# Patient Record
Sex: Female | Born: 1985 | Race: White | Hispanic: No | Marital: Single | Smoking: Current every day smoker
Health system: Southern US, Community
[De-identification: ages and names within clinical notes are randomized; demographics above are authoritative.]

## PROBLEM LIST (undated history)

## (undated) ENCOUNTER — Inpatient Hospital Stay (HOSPITAL_COMMUNITY): Payer: Self-pay

## (undated) DIAGNOSIS — N200 Calculus of kidney: Secondary | ICD-10-CM

## (undated) DIAGNOSIS — K0889 Other specified disorders of teeth and supporting structures: Secondary | ICD-10-CM

## (undated) DIAGNOSIS — B999 Unspecified infectious disease: Secondary | ICD-10-CM

## (undated) DIAGNOSIS — F32A Depression, unspecified: Secondary | ICD-10-CM

## (undated) DIAGNOSIS — R519 Headache, unspecified: Secondary | ICD-10-CM

## (undated) DIAGNOSIS — O219 Vomiting of pregnancy, unspecified: Secondary | ICD-10-CM

## (undated) DIAGNOSIS — R569 Unspecified convulsions: Secondary | ICD-10-CM

## (undated) DIAGNOSIS — F419 Anxiety disorder, unspecified: Secondary | ICD-10-CM

## (undated) DIAGNOSIS — N159 Renal tubulo-interstitial disease, unspecified: Secondary | ICD-10-CM

## (undated) DIAGNOSIS — B977 Papillomavirus as the cause of diseases classified elsewhere: Secondary | ICD-10-CM

## (undated) DIAGNOSIS — R51 Headache: Secondary | ICD-10-CM

## (undated) HISTORY — PX: TUBAL LIGATION: SHX77

## (undated) HISTORY — DX: Calculus of kidney: N20.0

## (undated) HISTORY — PX: BREAST SURGERY: SHX581

## (undated) HISTORY — PX: BREAST ENHANCEMENT SURGERY: SHX7

---
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
## 2005-01-07 ENCOUNTER — Inpatient Hospital Stay (HOSPITAL_COMMUNITY): Admission: EM | Admit: 2005-01-07 | Discharge: 2005-01-09 | Payer: Self-pay | Admitting: Emergency Medicine

## 2005-04-12 ENCOUNTER — Inpatient Hospital Stay (HOSPITAL_COMMUNITY): Admission: AD | Admit: 2005-04-12 | Discharge: 2005-04-12 | Payer: Self-pay | Admitting: Obstetrics and Gynecology

## 2006-02-20 ENCOUNTER — Emergency Department (HOSPITAL_COMMUNITY): Admission: EM | Admit: 2006-02-20 | Discharge: 2006-02-20 | Payer: Self-pay | Admitting: Emergency Medicine

## 2006-11-27 ENCOUNTER — Encounter: Admission: RE | Admit: 2006-11-27 | Discharge: 2007-02-25 | Payer: Self-pay | Admitting: Internal Medicine

## 2007-04-25 ENCOUNTER — Encounter: Admission: RE | Admit: 2007-04-25 | Discharge: 2007-04-25 | Payer: Self-pay | Admitting: Internal Medicine

## 2008-06-27 ENCOUNTER — Emergency Department (HOSPITAL_COMMUNITY): Admission: EM | Admit: 2008-06-27 | Discharge: 2008-06-27 | Payer: Self-pay | Admitting: Emergency Medicine

## 2008-07-04 ENCOUNTER — Emergency Department (HOSPITAL_COMMUNITY): Admission: EM | Admit: 2008-07-04 | Discharge: 2008-07-04 | Payer: Self-pay | Admitting: Emergency Medicine

## 2008-08-17 ENCOUNTER — Encounter: Admission: RE | Admit: 2008-08-17 | Discharge: 2008-08-17 | Payer: Self-pay | Admitting: Obstetrics and Gynecology

## 2009-01-21 ENCOUNTER — Emergency Department (HOSPITAL_COMMUNITY): Admission: EM | Admit: 2009-01-21 | Discharge: 2009-01-21 | Payer: Self-pay | Admitting: Emergency Medicine

## 2010-02-14 ENCOUNTER — Emergency Department (HOSPITAL_COMMUNITY): Admission: EM | Admit: 2010-02-14 | Discharge: 2010-02-14 | Payer: Self-pay | Admitting: Emergency Medicine

## 2010-02-15 ENCOUNTER — Emergency Department (HOSPITAL_COMMUNITY): Admission: EM | Admit: 2010-02-15 | Discharge: 2010-02-15 | Payer: Self-pay | Admitting: Emergency Medicine

## 2010-09-24 ENCOUNTER — Encounter: Payer: Self-pay | Admitting: Obstetrics and Gynecology

## 2010-11-20 LAB — URINALYSIS, ROUTINE W REFLEX MICROSCOPIC
Glucose, UA: NEGATIVE mg/dL
Ketones, ur: 40 mg/dL — AB
Nitrite: POSITIVE — AB
Protein, ur: 30 mg/dL — AB

## 2010-11-20 LAB — COMPREHENSIVE METABOLIC PANEL
ALT: 12 U/L (ref 0–35)
AST: 19 U/L (ref 0–37)
BUN: 5 mg/dL — ABNORMAL LOW (ref 6–23)
CO2: 23 mEq/L (ref 19–32)
Calcium: 9.3 mg/dL (ref 8.4–10.5)
Creatinine, Ser: 0.92 mg/dL (ref 0.4–1.2)
Glucose, Bld: 92 mg/dL (ref 70–99)
Potassium: 3.4 mEq/L — ABNORMAL LOW (ref 3.5–5.1)
Sodium: 142 mEq/L (ref 135–145)

## 2010-11-20 LAB — CBC
HCT: 43.4 % (ref 36.0–46.0)
Hemoglobin: 14.7 g/dL (ref 12.0–15.0)
MCHC: 33.8 g/dL (ref 30.0–36.0)
RBC: 4.73 MIL/uL (ref 3.87–5.11)

## 2010-11-20 LAB — DIFFERENTIAL
Eosinophils Relative: 0 % (ref 0–5)
Lymphocytes Relative: 10 % — ABNORMAL LOW (ref 12–46)
Monocytes Relative: 5 % (ref 3–12)
Neutro Abs: 11.5 10*3/uL — ABNORMAL HIGH (ref 1.7–7.7)

## 2010-11-20 LAB — URINE CULTURE

## 2010-11-20 LAB — URINE MICROSCOPIC-ADD ON

## 2010-12-12 LAB — CBC
HCT: 44.2 % (ref 36.0–46.0)
MCV: 91.2 fL (ref 78.0–100.0)
WBC: 12 10*3/uL — ABNORMAL HIGH (ref 4.0–10.5)

## 2010-12-12 LAB — POCT I-STAT, CHEM 8
Calcium, Ion: 1.16 mmol/L (ref 1.12–1.32)
Creatinine, Ser: 0.8 mg/dL (ref 0.4–1.2)
Glucose, Bld: 99 mg/dL (ref 70–99)
HCT: 46 % (ref 36.0–46.0)
Hemoglobin: 15.6 g/dL — ABNORMAL HIGH (ref 12.0–15.0)
Potassium: 3.7 mEq/L (ref 3.5–5.1)

## 2010-12-12 LAB — DIFFERENTIAL
Basophils Absolute: 0 10*3/uL (ref 0.0–0.1)
Basophils Relative: 0 % (ref 0–1)
Eosinophils Absolute: 0 10*3/uL (ref 0.0–0.7)
Eosinophils Relative: 0 % (ref 0–5)
Lymphs Abs: 1.2 10*3/uL (ref 0.7–4.0)
Monocytes Relative: 6 % (ref 3–12)

## 2010-12-12 LAB — URINALYSIS, ROUTINE W REFLEX MICROSCOPIC
Nitrite: NEGATIVE
Protein, ur: 30 mg/dL — AB
Specific Gravity, Urine: 1.025 (ref 1.005–1.030)
Urobilinogen, UA: 1 mg/dL (ref 0.0–1.0)

## 2010-12-12 LAB — URINE MICROSCOPIC-ADD ON

## 2011-01-19 NOTE — Discharge Summary (Signed)
NAMEARIYANAH, AGUADO NO.:  [id number]   MEDICAL RECORD NO.:  [id number]          PATIENT TYPE:  INP   LOCATION:  0358                         FACILITY:  [name]   PHYSICIAN:  Fleet Contras, M.D.    DATE OF BIRTH:  May 29, 1986   DATE OF ADMISSION:  01/07/2005  DATE OF DISCHARGE:  01/09/2005                                 DISCHARGE SUMMARY   PRESENTATION:  [name] is an 25 year old Caucasian lady who was brought  to the emergency room at [facility] by her mom with a complaint of  abdominal discomfort and vomiting of one day duration.  She has had multiple  episodes of vomiting and could not keep food or drink down.  She had the few  days of frequent urination but denies any dysuria.  She has no diarrhea, no  profound abdominal pain.  She had not had a recent cold or cough or  otherwise urinary tract infection.  She did recall an episode of urinary  tract infection about three months prior to this presentation which was  treated with antibiotics that she could not tolerate much, so most likely  did not complete the course.  Her initial evaluation in the emergency room  revealed a fever of 101.  She was slightly dehydrated and quite  uncomfortable.  Her initial laboratory data shows a white count of 13,000.  Her urinalysis was unremarkable.  A urinary pregnancy test was negative.  Her blood cultures were obtained and this also has been negative.  She was  admitted for intravenous antibiotics and intravenous fluids.   HOSPITAL COURSE:  On admission, the patient was started on intravenous  ciprofloxacin 400 mg q.12h. and initially was kept on clear liquids which  she was able to tolerate.  She also received Zofran IV for nausea and  vomiting with resolution of these symptoms.  Intravenous fluids with D-5  normal saline was given at 125 cc an hour.  Within 24 hours, he vomiting has  subsided.  Fever has subsided and she was feeling much better.  Her diet  was  advanced to a regular diet which she was able to tolerate.  An ultrasound  scan of her kidneys, ureters and bladder was performed to evaluate for any  anatomical abnormality and this was essentially normal.  On Jan 09, 2005,  she was feeling much better, well hydrated, afebrile, vital signs were  stable.  Chest was clear to auscultation.  S1 and S2 were heard.  The  abdomen was soft and nontender.  No masses.  Bowel sounds were present.  She  was therefore considered stable for discharge home.   DISCHARGE DIAGNOSES:  1.  Urinary tract infection.  2.  Vomiting with dehydration.   DISCHARGE MEDICATIONS:  Ciprofloxacin 250 mg one p.o. b.i.d. for five more  days.   CONDITION ON DISCHARGE:  Stable.   DISPOSITION:  For home.   FOLLOW UP:  Follow up will be with Dr. Fleet Contras in one week.  She was  encouraged to drink fluids liberally.  This plan of care was explained to  her and her mother  and all their questions were answered.      EA/MEDQ  D:  01/10/2005  T:  01/10/2005  Job:  [id number]

## 2012-09-28 ENCOUNTER — Emergency Department (HOSPITAL_COMMUNITY)
Admission: EM | Admit: 2012-09-28 | Discharge: 2012-09-28 | Disposition: A | Discharge status: Home / Self Care | Payer: Self-pay | Attending: Emergency Medicine | Admitting: Emergency Medicine

## 2012-09-28 DIAGNOSIS — R05 Cough: Secondary | ICD-10-CM | POA: Insufficient documentation

## 2012-09-28 DIAGNOSIS — J029 Acute pharyngitis, unspecified: Secondary | ICD-10-CM | POA: Insufficient documentation

## 2012-09-28 DIAGNOSIS — Z882 Allergy status to sulfonamides status: Secondary | ICD-10-CM | POA: Insufficient documentation

## 2012-09-28 DIAGNOSIS — Z79899 Other long term (current) drug therapy: Secondary | ICD-10-CM | POA: Insufficient documentation

## 2012-09-28 DIAGNOSIS — J3489 Other specified disorders of nose and nasal sinuses: Secondary | ICD-10-CM | POA: Insufficient documentation

## 2012-09-28 DIAGNOSIS — R509 Fever, unspecified: Secondary | ICD-10-CM | POA: Insufficient documentation

## 2012-09-28 DIAGNOSIS — R059 Cough, unspecified: Secondary | ICD-10-CM | POA: Insufficient documentation

## 2012-09-28 DIAGNOSIS — R000 Tachycardia, unspecified: Secondary | ICD-10-CM | POA: Insufficient documentation

## 2012-09-28 MED ORDER — SODIUM CHLORIDE 0.9 % IV BOLUS (SEPSIS)
1000.0000 mL | INTRAVENOUS | Status: AC
  Administered 2012-09-28: 1000 mL via INTRAVENOUS

## 2012-09-28 MED ORDER — ACETAMINOPHEN 160 MG/5ML PO SOLN: 650.0000 mg | Freq: Once | ORAL | Status: DC

## 2012-09-28 MED ORDER — CEFTRIAXONE SODIUM 250 MG IJ SOLR
250.0000 mg | Freq: Once | INTRAMUSCULAR | Status: AC
  Administered 2012-09-28: 250 mg via INTRAMUSCULAR
  Filled 2012-09-28: qty 250

## 2012-09-28 MED ORDER — HYDROCODONE-ACETAMINOPHEN 7.5-325 MG/15ML PO SOLN: 15.0000 mL | Freq: Three times a day (TID) | ORAL | Status: DC | PRN

## 2012-09-28 MED ORDER — AZITHROMYCIN 250 MG PO TABS
1000.0000 mg | ORAL_TABLET | Freq: Once | ORAL | Status: AC
  Administered 2012-09-28: 1000 mg via ORAL
  Filled 2012-09-28: qty 4

## 2012-09-28 MED ORDER — PENICILLIN G BENZATHINE 1200000 UNIT/2ML IM SUSP: 1.2000 10*6.[IU] | Freq: Once | INTRAMUSCULAR | Status: DC

## 2012-09-28 MED ORDER — AMOXICILLIN 500 MG PO CAPS: 500.0000 mg | ORAL_CAPSULE | Freq: Three times a day (TID) | ORAL | Status: DC

## 2012-09-28 MED ORDER — HYDROCODONE-ACETAMINOPHEN 7.5-325 MG/15ML PO SOLN
10.0000 mL | Freq: Once | ORAL | Status: AC
  Administered 2012-09-28: 10 mL via ORAL
  Filled 2012-09-28: qty 15

## 2012-09-28 NOTE — ED Notes (Signed)
Pt c/o sore throat for the past 2 days. Pt denies any other symptoms. States she has taken OTC throat spray, but not helping now. Pt with no acute distress.

## 2012-09-28 NOTE — ED Provider Notes (Signed)
Medical screening examination/treatment/procedure(s) were performed by non-physician practitioner and as supervising physician I was immediately available for consultation/collaboration.  Linna Thebeau, MD 09/28/12 2354 

## 2012-09-28 NOTE — ED Provider Notes (Signed)
History   This chart was scribed for non-physician practitioner working with Gerhard Munch, MD by Frederik Pear, ED Scribe. This patient was seen in room WTR8/WTR8 and the patient's care was started at 2117.   CSN: [id number]  Arrival date & time 09/28/12  2055   First MD Initiated Contact with Patient 09/28/12 2117      Chief Complaint  Patient presents with  . Sore Throat    (Consider location/radiation/quality/duration/timing/severity/associated sxs/prior treatment) HPI  Roberta Baker is a 27 y.o. female who presents to the Emergency Department with a chief complaint of a constant sore throat that began 2 days ago. She reports that the pain is aggravated by talking and swallowing. She reports that she had a fever 2 days ago, which she believes peaked at 101 at home, and treated it with Tylenol. She states that the symptom has since resolved. She reports that she treated her sore throat with OTC throat sprays with no relief. She denies any other symptoms including coughing and rhinorrhea. She denies any chronic medication conditions that require daily medications. She is allergic to sulfa antibiotics and has a h/o of intolerance to pain medication except Percocet.  No past medical history on file.  No past surgical history on file.  No family history on file.  History  Substance Use Topics  . Smoking status: Not on file  . Smokeless tobacco: Not on file  . Alcohol Use: Not on file    OB History    No data available      Review of Systems A complete 10 system review of systems was obtained and all systems are negative except as noted in the HPI and PMH.   Allergies  Sulfa antibiotics  Home Medications   Current Outpatient Rx  Name  Route  Sig  Dispense  Refill  . AMPHETAMINE-DEXTROAMPHET ER 15 MG PO CP24   Oral   Take 15 mg by mouth every morning.         Marland Kitchen CLONAZEPAM 1 MG PO TABS   Oral   Take 1 mg by mouth daily as needed. For anxiety         .  MEDROXYPROGESTERONE ACETATE 150 MG/ML IM SUSP   Intramuscular   Inject 150 mg into the muscle every 3 (three) months.         Marland Kitchen PHENOL 1.4 % MT LIQD   Mouth/Throat   Use as directed 1 spray in the mouth or throat every 4 (four) hours as needed. For sore throat           BP 102/61  Pulse 122  Temp 99.8 F (37.7 C) (Oral)  Resp 17  SpO2 100%  Physical Exam  Nursing note and vitals reviewed. Constitutional: She is oriented to person, place, and time. She appears well-developed and well-nourished. No distress.  HENT:  Head: Normocephalic and atraumatic.       Inflamed oropharynx with mild tonsillar exudates. No para-tonsillar exudates.  Eyes: EOM are normal. Pupils are equal, round, and reactive to light.  Neck: Normal range of motion. Neck supple. No tracheal deviation present.  Cardiovascular: Tachycardia present.   Pulmonary/Chest: Effort normal. No respiratory distress.  Abdominal: Soft. She exhibits no distension.  Musculoskeletal: Normal range of motion. She exhibits no edema.  Lymphadenopathy:    She has cervical adenopathy.  Neurological: She is alert and oriented to person, place, and time.  Skin: Skin is warm and dry.  Psychiatric: She has a normal mood and affect. Her behavior  is normal.    ED Course  Procedures (including critical care time)  DIAGNOSTIC STUDIES: Oxygen Saturation is 100% on room air, normal by my interpretation.    COORDINATION OF CARE:  21:33- Discussed planned course of treatment with the patient, including Rocephin, Zithromax, Hycet, and IV fluids, who is agreeable at this time.  22:00- Medication Orders- Hydrocodone-acetaminophen (Hycet) 7.5-325 mg/15 ml solution 10 ml- once, sodium chloride 0.9% bolus 1,000 mL.  22:15- Medication Orders- azithromycin (zithromax) tablet 1,000 mg- once, ceftriaxone (rocephin) injection 250 mg- once.  Results for orders placed during the hospital encounter of 09/28/12  RAPID STREP SCREEN       Component Value Range   Streptococcus, Group A Screen (Direct) NEGATIVE  NEGATIVE    Labs Reviewed  RAPID STREP SCREEN     1. Pharyngitis       MDM  26 rolled female with sore throat. Patient meets Centor criteria, however I am also concerned about gonococcal pharyngitis, and will obtain a culture. I'm going to treat the patient with ceftriaxone, azithromycin, and amoxicillin. Will also give the patient Hycet for pain.  The patient was also tachycardic in the ED, so I gave her some fluid, and her tachycardia nearly resolved.  Patient's pain is also improved.  The patient is stable and ready for discharge.  I personally performed the services described in this documentation, which was scribed in my presence. The recorded information has been reviewed and is accurate.         Roxy Horseman, PA-C 09/28/12 2300

## 2012-09-30 LAB — GC/CHLAMYDIA PROBE AMP: GC Probe RNA: NEGATIVE

## 2013-12-08 ENCOUNTER — Encounter (HOSPITAL_COMMUNITY): Payer: Self-pay | Admitting: Emergency Medicine

## 2013-12-08 ENCOUNTER — Emergency Department (HOSPITAL_COMMUNITY)
Admission: EM | Admit: 2013-12-08 | Discharge: 2013-12-08 | Disposition: A | Discharge status: Home / Self Care | Payer: Self-pay | Attending: Emergency Medicine | Admitting: Emergency Medicine

## 2013-12-08 ENCOUNTER — Emergency Department (HOSPITAL_COMMUNITY): Payer: Self-pay

## 2013-12-08 DIAGNOSIS — Y929 Unspecified place or not applicable: Secondary | ICD-10-CM | POA: Insufficient documentation

## 2013-12-08 DIAGNOSIS — S62339A Displaced fracture of neck of unspecified metacarpal bone, initial encounter for closed fracture: Secondary | ICD-10-CM | POA: Insufficient documentation

## 2013-12-08 DIAGNOSIS — S62308A Unspecified fracture of other metacarpal bone, initial encounter for closed fracture: Secondary | ICD-10-CM

## 2013-12-08 DIAGNOSIS — Y9389 Activity, other specified: Secondary | ICD-10-CM | POA: Insufficient documentation

## 2013-12-08 DIAGNOSIS — W2209XA Striking against other stationary object, initial encounter: Secondary | ICD-10-CM | POA: Insufficient documentation

## 2013-12-08 DIAGNOSIS — Z79899 Other long term (current) drug therapy: Secondary | ICD-10-CM | POA: Insufficient documentation

## 2013-12-08 MED ORDER — PROMETHAZINE HCL 25 MG PO TABS: 25.0000 mg | ORAL_TABLET | Freq: Four times a day (QID) | ORAL | Status: DC | PRN

## 2013-12-08 MED ORDER — ONDANSETRON 8 MG PO TBDP
8.0000 mg | ORAL_TABLET | Freq: Once | ORAL | Status: AC
  Administered 2013-12-08: 8 mg via ORAL
  Filled 2013-12-08: qty 1

## 2013-12-08 MED ORDER — HYDROCODONE-ACETAMINOPHEN 5-325 MG PO TABS: 1.0000 | ORAL_TABLET | ORAL | Status: DC | PRN

## 2013-12-08 MED ORDER — HYDROCODONE-ACETAMINOPHEN 5-325 MG PO TABS
1.0000 | ORAL_TABLET | Freq: Once | ORAL | Status: AC
  Administered 2013-12-08: 1 via ORAL
  Filled 2013-12-08: qty 1

## 2013-12-08 NOTE — ED Provider Notes (Signed)
CSN: [id number]     Arrival date & time 12/08/13  1840 History  This chart was scribed for Ruby Colaatherine Dwaine Pringle, PA, working with Donnetta HutchingBrian Cook, MD, by Ardelia Memsylan Malpass ED Scribe. This patient was seen in room WTR5/WTR5 and the patient's care was started at 8:14 PM.   Chief Complaint  Patient presents with  . Hand Pain    The history is provided by the patient. No language interpreter was used.    HPI Comments: Roberta Baker is a 28 y.o. female who presents to the Emergency Department complaining of constant, severe right hand pain onset after punching a door about 2 hours ago. Pt states that her pain is worsened with moving her wrist or her fingers. She states that she has applied ice without relief. She states that she has not take any medications for her pain. She denies any other pain or symptoms.   History reviewed. No pertinent past medical history. Past Surgical History  Procedure Laterality Date  . Breast enhancement surgery     No family history on file. History  Substance Use Topics  . Smoking status: Never Smoker   . Smokeless tobacco: Not on file  . Alcohol Use: Yes   OB History   Grav Para Term Preterm Abortions TAB SAB Ect Mult Living                 Review of Systems  All other systems reviewed and are negative.   Allergies  Sulfa antibiotics  Home Medications   Current Outpatient Rx  Name  Route  Sig  Dispense  Refill  . amphetamine-dextroamphetamine (ADDERALL XR) 15 MG 24 hr capsule   Oral   Take 15 mg by mouth every morning.         . clonazePAM (KLONOPIN) 1 MG tablet   Oral   Take 1 mg by mouth daily as needed. For anxiety         . medroxyPROGESTERone (DEPO-PROVERA) 150 MG/ML injection   Intramuscular   Inject 150 mg into the muscle every 3 (three) months.          Triage Vitals: BP 102/70  Pulse 95  Temp(Src) 98.3 F (36.8 C) (Oral)  Resp 18  SpO2 100%  LMP 11/24/2013  Physical Exam  Nursing note and vitals  reviewed. Constitutional: She is oriented to person, place, and time. She appears well-developed and well-nourished. No distress.  HENT:  Head: Normocephalic and atraumatic.  Eyes:  Normal appearance  Neck: Normal range of motion.  Pulmonary/Chest: Effort normal.  Musculoskeletal: Normal range of motion.  Edema and ecchymosis dorsomedial aspect right hand.  Tenderness 3rd-5th metacarpals.  Pain w/ passive flexion wrist as well as 5th digit.  2+ radial pulse, brisk cap refill and distal sensation intact.      Neurological: She is alert and oriented to person, place, and time.  Psychiatric: She has a normal mood and affect. Her behavior is normal.    ED Course  Procedures (including critical care time)  DIAGNOSTIC STUDIES: Oxygen Saturation is 100% on RA, normal by my interpretation.    COORDINATION OF CARE: 8:18 PM- Will obtain an X-ray of pt's right hand. Will also order Norco for pain. Pt advised of plan for treatment and pt agrees.  Medications  HYDROcodone-acetaminophen (NORCO/VICODIN) 5-325 MG per tablet 1 tablet (not administered)   Labs Review Labs Reviewed - No data to display Imaging Review Dg Hand Complete Right  12/08/2013   CLINICAL DATA:  Hand pain after hitting a  wall today.  EXAM: RIGHT HAND - COMPLETE 3+ VIEW  COMPARISON:  None.  FINDINGS: There is an angulated minimally displaced fracture of the head of the fifth metacarpal. The other bones are normal.  IMPRESSION: Boxer's fracture of the distal fifth metacarpal.   Electronically Signed   By: Geanie Cooley M.D.   On: 12/08/2013 20:29     EKG Interpretation None      MDM   Final diagnoses:  Fracture of fifth metacarpal bone    27yo F presents w/ R hand pain s/p punching a wall this evening.  Xray sig for boxer's fx.  No NV deficits on exam.  Ortho tech placed in ulna gutter splint. I prescribed short course of vicodin, recommended ice/elevation and referred to Hand.  Return precautions discussed.   I  personally performed the services described in this documentation, which was scribed in my presence. The recorded information has been reviewed and is accurate.  Otilio Miu, PA-C 12/09/13 [phone]

## 2013-12-08 NOTE — Discharge Instructions (Signed)
Take vicodin as prescribed for severe pain.  Do not drive within four hours of taking this medication (may cause drowsiness or confusion).   Take promethazine as needed for nausea.    Ice 2-3 times a day for 15-20 minutes but do not allow your splint to get wet.  Elevate as often as possible.  Follow up with the Hand specialist you have been referred to.   You may return to the ER if symptoms worsen or you have any other concerns.

## 2013-12-08 NOTE — ED Notes (Signed)
Ortho completed at this time

## 2013-12-08 NOTE — ED Notes (Signed)
Pt presents with NAD. Pt upset and hit a door injuring rt hand and wrist. Pt states pain with movement

## 2013-12-11 NOTE — ED Provider Notes (Signed)
Medical screening examination/treatment/procedure(s) were performed by non-physician practitioner and as supervising physician I was immediately available for consultation/collaboration.   EKG Interpretation None       Donnetta HutchingBrian Bena Kobel, MD 12/11/13 2005

## 2013-12-17 ENCOUNTER — Emergency Department (HOSPITAL_COMMUNITY)
Admission: EM | Admit: 2013-12-17 | Discharge: 2013-12-17 | Disposition: A | Discharge status: Home / Self Care | Payer: Self-pay | Attending: Emergency Medicine | Admitting: Emergency Medicine

## 2013-12-17 ENCOUNTER — Encounter (HOSPITAL_COMMUNITY): Payer: Self-pay | Admitting: Emergency Medicine

## 2013-12-17 DIAGNOSIS — R609 Edema, unspecified: Secondary | ICD-10-CM | POA: Insufficient documentation

## 2013-12-17 DIAGNOSIS — Z79899 Other long term (current) drug therapy: Secondary | ICD-10-CM | POA: Insufficient documentation

## 2013-12-17 DIAGNOSIS — IMO0001 Reserved for inherently not codable concepts without codable children: Secondary | ICD-10-CM | POA: Insufficient documentation

## 2013-12-17 DIAGNOSIS — Z4789 Encounter for other orthopedic aftercare: Secondary | ICD-10-CM

## 2013-12-17 DIAGNOSIS — R209 Unspecified disturbances of skin sensation: Secondary | ICD-10-CM | POA: Insufficient documentation

## 2013-12-17 DIAGNOSIS — S62339A Displaced fracture of neck of unspecified metacarpal bone, initial encounter for closed fracture: Secondary | ICD-10-CM

## 2013-12-17 MED ORDER — ONDANSETRON 4 MG PO TBDP
4.0000 mg | ORAL_TABLET | Freq: Once | ORAL | Status: AC
  Administered 2013-12-17: 4 mg via ORAL
  Filled 2013-12-17: qty 1

## 2013-12-17 MED ORDER — ONDANSETRON HCL 4 MG PO TABS: 4.0000 mg | ORAL_TABLET | Freq: Three times a day (TID) | ORAL | Status: DC | PRN

## 2013-12-17 NOTE — ED Notes (Addendum)
Pt reports being seen on 12/08/13 for a boxers fracture of the right hand. Pt states she was instructed to return if she had difficulty with the dressing. Pt reports numbness and swelling of the index and middle finger. Pt presents from her PCP and was instructed to come the ED. Capillary refill in the affected fingers is less than two seconds. Pt is A/O x4, in NAD, and vitals are WDL. Pt reports having an ortho appointment for the 21st of this month.

## 2013-12-17 NOTE — Discharge Instructions (Signed)
Read the information below.  You may return to the Emergency Department at any time for worsening condition or any new symptoms that concern you.  If you develop uncontrolled pain, weakness or numbness of the extremity, severe discoloration of the skin, or you are unable to move your fingers, return to the ER for a recheck.      Cast or Splint Care Casts and splints support injured limbs and keep bones from moving while they heal. It is important to care for your cast or splint at home.  HOME CARE INSTRUCTIONS  Keep the cast or splint uncovered during the drying period. It can take 24 to 48 hours to dry if it is made of plaster. A fiberglass cast will dry in less than 1 hour.  Do not rest the cast on anything harder than a pillow for the first 24 hours.  Do not put weight on your injured limb or apply pressure to the cast until your health care provider gives you permission.  Keep the cast or splint dry. Wet casts or splints can lose their shape and may not support the limb as well. A wet cast that has lost its shape can also create harmful pressure on your skin when it dries. Also, wet skin can become infected.  Cover the cast or splint with a plastic bag when bathing or when out in the rain or snow. If the cast is on the trunk of the body, take sponge baths until the cast is removed.  If your cast does become wet, dry it with a towel or a blow dryer on the cool setting only.  Keep your cast or splint clean. Soiled casts may be wiped with a moistened cloth.  Do not place any hard or soft foreign objects under your cast or splint, such as cotton, toilet paper, lotion, or powder.  Do not try to scratch the skin under the cast with any object. The object could get stuck inside the cast. Also, scratching could lead to an infection. If itching is a problem, use a blow dryer on a cool setting to relieve discomfort.  Do not trim or cut your cast or remove padding from inside of it.  Exercise  all joints next to the injury that are not immobilized by the cast or splint. For example, if you have a long leg cast, exercise the hip joint and toes. If you have an arm cast or splint, exercise the shoulder, elbow, thumb, and fingers.  Elevate your injured arm or leg on 1 or 2 pillows for the first 1 to 3 days to decrease swelling and pain.It is best if you can comfortably elevate your cast so it is higher than your heart. SEEK MEDICAL CARE IF:   Your cast or splint cracks.  Your cast or splint is too tight or too loose.  You have unbearable itching inside the cast.  Your cast becomes wet or develops a soft spot or area.  You have a bad smell coming from inside your cast.  You get an object stuck under your cast.  Your skin around the cast becomes red or raw.  You have new pain or worsening pain after the cast has been applied. SEEK IMMEDIATE MEDICAL CARE IF:   You have fluid leaking through the cast.  You are unable to move your fingers or toes.  You have discolored (blue or white), cool, painful, or very swollen fingers or toes beyond the cast.  You have tingling or numbness  around the injured area.  You have severe pain or pressure under the cast.  You have any difficulty with your breathing or have shortness of breath.  You have chest pain. Document Released: 08/17/2000 Document Revised: 06/10/2013 Document Reviewed: 02/26/2013 [facility] Patient Information 2014 [place], [place].

## 2013-12-17 NOTE — ED Provider Notes (Signed)
CSN: [id number]     Arrival date & time 12/17/13  1502 History  [id number] chart was scribed for non-physician practitioner working with Enid SkeensJoshua M Zavitz, MD by Ashley JacobsBrittany Andrews, ED scribe. This patient was seen in room WTR5/WTR5 and the patient's care was started at 3:27 PM.   First MD Initiated Contact with Patient 12/17/13 1509     Chief Complaint  Patient presents with  . Wound Check     (Consider location/radiation/quality/duration/timing/severity/associated sxs/prior Treatment) The history is provided by the patient and medical records. No language interpreter was used.   HPI Comments: Roberta Baker is a 28 y.o. female who presents to the Emergency Department for a wound check. Pt was seen 12/08/13 to be treated for a boxer fracture to her right hand. Pt was advised to come to the ED under the advice of her PCP. Pt has swelling, pain, and numbness to her second and third digit. Her symptoms started the second day after her visit to the ED and became unbearable four day PTA. She has an appointment with an orthopedist in six days.  History reviewed. No pertinent past medical history. Past Surgical History  Procedure Laterality Date  . Breast enhancement surgery     No family history on file. History  Substance Use Topics  . Smoking status: Never Smoker   . Smokeless tobacco: Not on file  . Alcohol Use: Yes   OB History   Grav Para Term Preterm Abortions TAB SAB Ect Mult Living                 Review of Systems  Musculoskeletal: Positive for arthralgias and myalgias.  Neurological: Positive for numbness.  All other systems reviewed and are negative.     Allergies  Sulfa antibiotics  Home Medications   Prior to Admission medications   Medication Sig Start Date End Date Taking? Authorizing Provider  amphetamine-dextroamphetamine (ADDERALL XR) 15 MG 24 hr capsule Take 15 mg by mouth every morning.    Historical Provider, MD  clonazePAM (KLONOPIN) 1 MG tablet Take 1 mg  by mouth daily as needed. For anxiety    Historical Provider, MD  HYDROcodone-acetaminophen (NORCO/VICODIN) 5-325 MG per tablet Take 1 tablet by mouth every 4 (four) hours as needed for moderate pain. 12/08/13   Arie Sabinaatherine E Schinlever, PA-C  medroxyPROGESTERone (DEPO-PROVERA) 150 MG/ML injection Inject 150 mg into the muscle every 3 (three) months.    Historical Provider, MD  promethazine (PHENERGAN) 25 MG tablet Take 1 tablet (25 mg total) by mouth every 6 (six) hours as needed for nausea or vomiting. 12/08/13   Arie Sabinaatherine E Schinlever, PA-C   BP 112/71  Pulse 98  Temp(Src) 98.4 F (36.9 C) (Oral)  Resp 16  SpO2 100%  LMP 11/24/2013 Physical Exam  Nursing note and vitals reviewed. Constitutional: She appears well-developed and well-nourished. No distress.  HENT:  Head: Normocephalic and atraumatic.  Neck: Neck supple.  Cardiovascular: Normal rate and intact distal pulses.   Pulmonary/Chest: Effort normal.  Musculoskeletal: Normal range of motion. She exhibits edema.  Ulnar gutter splint to right upper extremity  Full active ROM of the first, second, and third digit Cap refill is less than 3 seconds Third finger is mildly edematous Sensation intact fourth and fifth digit  After splint removed:  Dry, pink area at base of 3rd finger that was abutting the splint.  No break in skin.  Mild swelling has resolved.    Neurological: She is alert.  Skin: She is not diaphoretic.  ED Course  Procedures (including critical care time) DIAGNOSTIC STUDIES: Oxygen Saturation is 100% on room air, normal by my interpretation.    COORDINATION OF CARE:  3:21 PM Discussed course of care with pt . Pt understands and agrees.    Labs Review Labs Reviewed - No data to display  Imaging Review No results found.   EKG Interpretation None      Pt vomiting.  Family member states this is what happens when patient gets overwhelmed.  Also having pain while splint is being changed.    MDM   Final  diagnoses:  Closed boxer's fracture  Aftercare for cast or splint check or change   Pt with known boxers fracture presents with problems with her splint.  Splint was causing her middle finger to be pushed dorsally/posterior anatomical positioning with mild swelling.  Neurovascularly intact.  Skin intact.  Splint removed and replaced by ortho tech.  Pt reporting great relief.  She did experience an episode of vomiting while she was here that resolved with zofran- family member noted it to be emotional/anxious response that is common for her.  Pt d/c home with orthopedic follow up as previously planned.  Discussed  findings, treatment, and follow up  with patient.  Pt given return precautions.  Pt verbalizes understanding and agrees with plan.      I personally performed the services described in this documentation, which was scribed in my presence. The recorded information has been reviewed and is accurate.     WalkerEmily Shawna Wearing, PA-C 12/17/13 [phone]

## 2013-12-18 NOTE — ED Provider Notes (Signed)
Medical screening examination/treatment/procedure(s) were performed by non-physician practitioner and as supervising physician I was immediately available for consultation/collaboration.   EKG Interpretation None        Roberta SkeensJoshua M Flora Parks, MD 12/18/13 [phone]

## 2014-07-13 ENCOUNTER — Emergency Department (HOSPITAL_COMMUNITY): Payer: Self-pay

## 2014-07-13 ENCOUNTER — Encounter (HOSPITAL_COMMUNITY): Payer: Self-pay | Admitting: Emergency Medicine

## 2014-07-13 ENCOUNTER — Emergency Department (HOSPITAL_COMMUNITY)
Admission: EM | Admit: 2014-07-13 | Discharge: 2014-07-13 | Disposition: A | Discharge status: Home / Self Care | Payer: Self-pay | Attending: Emergency Medicine | Admitting: Emergency Medicine

## 2014-07-13 DIAGNOSIS — F1320 Sedative, hypnotic or anxiolytic dependence, uncomplicated: Secondary | ICD-10-CM

## 2014-07-13 DIAGNOSIS — Z3202 Encounter for pregnancy test, result negative: Secondary | ICD-10-CM | POA: Insufficient documentation

## 2014-07-13 DIAGNOSIS — F19230 Other psychoactive substance dependence with withdrawal, uncomplicated: Secondary | ICD-10-CM

## 2014-07-13 DIAGNOSIS — G40901 Epilepsy, unspecified, not intractable, with status epilepticus: Secondary | ICD-10-CM | POA: Insufficient documentation

## 2014-07-13 DIAGNOSIS — F19930 Other psychoactive substance use, unspecified with withdrawal, uncomplicated: Secondary | ICD-10-CM

## 2014-07-13 DIAGNOSIS — R569 Unspecified convulsions: Secondary | ICD-10-CM

## 2014-07-13 HISTORY — DX: Unspecified convulsions: R56.9

## 2014-07-13 LAB — CBC WITH DIFFERENTIAL/PLATELET
BASOS ABS: 0 10*3/uL (ref 0.0–0.1)
BASOS PCT: 0 % (ref 0–1)
Eosinophils Absolute: 0 10*3/uL (ref 0.0–0.7)
Eosinophils Relative: 0 % (ref 0–5)
HEMATOCRIT: 42.1 % (ref 36.0–46.0)
Hemoglobin: 14.2 g/dL (ref 12.0–15.0)
Lymphocytes Relative: 14 % (ref 12–46)
Lymphs Abs: 1.6 10*3/uL (ref 0.7–4.0)
MCH: 31.2 pg (ref 26.0–34.0)
MCHC: 33.7 g/dL (ref 30.0–36.0)
MCV: 92.5 fL (ref 78.0–100.0)
MONO ABS: 0.9 10*3/uL (ref 0.1–1.0)
Monocytes Relative: 8 % (ref 3–12)
NEUTROS ABS: 9 10*3/uL — AB (ref 1.7–7.7)
NEUTROS PCT: 78 % — AB (ref 43–77)
PLATELETS: 289 10*3/uL (ref 150–400)
RBC: 4.55 MIL/uL (ref 3.87–5.11)
RDW: 13.1 % (ref 11.5–15.5)
WBC: 11.5 10*3/uL — ABNORMAL HIGH (ref 4.0–10.5)

## 2014-07-13 LAB — COMPREHENSIVE METABOLIC PANEL
ALK PHOS: 52 U/L (ref 39–117)
ALT: 10 U/L (ref 0–35)
ANION GAP: 11 (ref 5–15)
AST: 15 U/L (ref 0–37)
Albumin: 4.2 g/dL (ref 3.5–5.2)
BILIRUBIN TOTAL: 0.4 mg/dL (ref 0.3–1.2)
BUN: 3 mg/dL — AB (ref 6–23)
CHLORIDE: 104 meq/L (ref 96–112)
CO2: 26 meq/L (ref 19–32)
CREATININE: 0.6 mg/dL (ref 0.50–1.10)
Calcium: 9.5 mg/dL (ref 8.4–10.5)
GLUCOSE: 101 mg/dL — AB (ref 70–99)
POTASSIUM: 3.8 meq/L (ref 3.7–5.3)
Sodium: 141 mEq/L (ref 137–147)
Total Protein: 6.7 g/dL (ref 6.0–8.3)

## 2014-07-13 LAB — PREGNANCY, URINE: Preg Test, Ur: NEGATIVE

## 2014-07-13 LAB — URINALYSIS, ROUTINE W REFLEX MICROSCOPIC
BILIRUBIN URINE: NEGATIVE
Glucose, UA: NEGATIVE mg/dL
KETONES UR: NEGATIVE mg/dL
Leukocytes, UA: NEGATIVE
NITRITE: NEGATIVE
Protein, ur: NEGATIVE mg/dL
SPECIFIC GRAVITY, URINE: 1.012 (ref 1.005–1.030)
UROBILINOGEN UA: 0.2 mg/dL (ref 0.0–1.0)
pH: 7 (ref 5.0–8.0)

## 2014-07-13 LAB — CBG MONITORING, ED: Glucose-Capillary: 99 mg/dL (ref 70–99)

## 2014-07-13 LAB — RAPID URINE DRUG SCREEN, HOSP PERFORMED
Amphetamines: NOT DETECTED
BARBITURATES: NOT DETECTED
Benzodiazepines: POSITIVE — AB
Cocaine: NOT DETECTED
OPIATES: NOT DETECTED
TETRAHYDROCANNABINOL: POSITIVE — AB

## 2014-07-13 LAB — URINE MICROSCOPIC-ADD ON

## 2014-07-13 MED ORDER — PROMETHAZINE HCL 25 MG/ML IJ SOLN
25.0000 mg | Freq: Once | INTRAMUSCULAR | Status: AC
  Administered 2014-07-13: 25 mg via INTRAVENOUS
  Filled 2014-07-13: qty 1

## 2014-07-13 MED ORDER — MAGNESIUM SULFATE 2 GM/50ML IV SOLN
2.0000 g | Freq: Once | INTRAVENOUS | Status: AC
  Administered 2014-07-13: 2 g via INTRAVENOUS
  Filled 2014-07-13: qty 50

## 2014-07-13 MED ORDER — CLONAZEPAM 1 MG PO TABS: 1.0000 mg | ORAL_TABLET | Freq: Two times a day (BID) | ORAL | Status: DC | PRN

## 2014-07-13 MED ORDER — METOCLOPRAMIDE HCL 5 MG/ML IJ SOLN
10.0000 mg | Freq: Once | INTRAMUSCULAR | Status: AC
  Administered 2014-07-13: 10 mg via INTRAVENOUS
  Filled 2014-07-13: qty 2

## 2014-07-13 MED ORDER — LORAZEPAM 2 MG/ML IJ SOLN
1.0000 mg | Freq: Once | INTRAMUSCULAR | Status: AC
  Administered 2014-07-13: 1 mg via INTRAVENOUS
  Filled 2014-07-13: qty 1

## 2014-07-13 MED ORDER — DIPHENHYDRAMINE HCL 50 MG/ML IJ SOLN
25.0000 mg | Freq: Once | INTRAMUSCULAR | Status: AC
  Administered 2014-07-13: 25 mg via INTRAVENOUS
  Filled 2014-07-13: qty 1

## 2014-07-13 MED ORDER — KETOROLAC TROMETHAMINE 30 MG/ML IJ SOLN
30.0000 mg | Freq: Once | INTRAMUSCULAR | Status: AC
  Administered 2014-07-13: 30 mg via INTRAVENOUS
  Filled 2014-07-13: qty 1

## 2014-07-13 MED ORDER — AMPHETAMINE-DEXTROAMPHET ER 15 MG PO CP24: 15.0000 mg | ORAL_CAPSULE | ORAL | Status: DC

## 2014-07-13 MED ORDER — SODIUM CHLORIDE 0.9 % IV BOLUS (SEPSIS)
1000.0000 mL | Freq: Once | INTRAVENOUS | Status: AC
  Administered 2014-07-13: 1000 mL via INTRAVENOUS

## 2014-07-13 NOTE — ED Notes (Signed)
CBG- 99 

## 2014-07-13 NOTE — Discharge Instructions (Signed)
You need to get a primary care doctor, you can go to the wellness clinic to get established. You also need someone to manage your ADD and anxiety. You can go to [facility] and have them start managing that.     Benzodiazepine Withdrawal  Benzodiazepines are a group of drugs that are prescribed for both short-term and long-term treatment of a variety of medical conditions. For some of these conditions, such as seizures and sudden and severe muscle spasms, they are used only for a few hours or a few days. For other conditions, such as anxiety, sleep problems, or frequent muscle spasms or to help prevent seizures, they are used for an extended period, usually weeks or months. Benzodiazepines work by changing the way your brain functions. Normally, chemicals in your brain called neurotransmitters send messages between your brain cells. The neurotransmitter that benzodiazepines affect is called gamma-aminobutyric acid (GABA). GABA sends out messages that have a calming effect on many of the functions of your brain. Benzodiazepines make these messages stronger and increase this calming effect. Short-term use of benzodiazepines usually does not cause problems when you stop taking the drugs. However, if you take benzodiazepines for a long time, your body can adjust to the drug and require more of it to produce the same effect (drug tolerance). Eventually, you can develop physical dependence on benzodiazepines, which is when you experience negative effects if your dosage of benzodiazepines is reduced or stopped too quickly. These negative effects are called symptoms of withdrawal. SYMPTOMS Symptoms of withdrawal may begin anytime within the first 10 days after you stop taking the benzodiazepine. They can last from several weeks up to a few months but usually are the worst between the first 10 to 14 days.  The actual symptoms also vary, depending on the type of benzodiazepine you take. Possible symptoms  include:  Anxiety.  Excitability.  Irritability.  Depression.  Mood swings.  Trouble sleeping.  Confusion.  Uncontrollable shaking (tremors).  Muscle weakness.  Seizures. DIAGNOSIS To diagnose benzodiazepine withdrawal, your caregiver will examine you for certain signs, such as:  Rapid heartbeat.  Rapid breathing.  Tremors.  High blood pressure.  Fever.  Mood changes. Your caregiver also may ask the following questions about your use of benzodiazepines:  What type of benzodiazepine did you take?  How much did you take each day?  How long did you take the drug?  When was the last time you took the drug?  Do you take any other drugs?  Have you had alcohol recently?  Have you had a seizure recently?  Have you lost consciousness recently?  Have you had trouble remembering recent events?  Have you had a recent increase in anxiety, irritability, or trouble sleeping? A drug test also may be administered. TREATMENT The treatment for benzodiazepine withdrawal can vary, depending on the type and severity of your symptoms, what type of benzodiazepine you have been taking, and how long you have been taking the benzodiazepine. Sometimes it is necessary for you to be treated in a hospital, especially if you are at risk of seizures.  Often, treatment includes a prescription for a long-acting benzodiazepine, the dosage of which is reduced slowly over a long period. This period could be several weeks or months. Eventually, your dosage will be reduced to a point that you can stop taking the drug, without experiencing withdrawal symptoms. This is called tapered withdrawal. Occasionally, minor symptoms of withdrawal continue for a few days or weeks after you have completed a tapered  withdrawal. SEEK IMMEDIATE MEDICAL CARE IF:  You have a seizure.  You develop a craving for drugs or alcohol.  You begin to experience symptoms of withdrawal during your tapered  withdrawal.  You become very confused.  You lose consciousness.  You have trouble breathing.  You think about hurting yourself or someone else. Document Released: 08/09/2011 Document Revised: 11/12/2011 Document Reviewed: 08/09/2011 [facility] Patient Information 2015 [place], [place]. This information is not intended to replace advice given to you by your health care provider. Make sure you discuss any questions you have with your health care provider.  Chemical Dependency Chemical dependency is an addiction to drugs or alcohol. It is characterized by the repeated behavior of seeking out and using drugs and alcohol despite harmful consequences to the health and safety of ones self and others.  RISK FACTORS There are certain situations or behaviors that increase a person's risk for chemical dependency. These include:  A family history of chemical dependency.  A history of mental health issues, including depression and anxiety.  A home environment where drugs and alcohol are easily available to you.  Drug or alcohol use at a young age. SYMPTOMS  The following symptoms can indicate chemical dependency:  Inability to limit the use of drugs or alcohol.  Nausea, sweating, shakiness, and anxiety that occurs when alcohol or drugs are not being used.  An increase in amount of drugs or alcohol that is necessary to get drunk or high. People who experience these symptoms can assess their use of drugs and alcohol by asking themselves the following questions:  Have you been told by friends or family that they are worried about your use of alcohol or drugs?  Do friends and family ever tell you about things you did while drinking alcohol or using drugs that you do not remember?  Do you lie about using alcohol or drugs or about the amounts you use?  Do you have difficulty completing daily tasks unless you use alcohol or drugs?  Is the level of your work or school performance lower because of  your drug or alcohol use?  Do you get sick from using drugs or alcohol but keep using anyway?  Do you feel uncomfortable in social situations unless you use alcohol or drugs?  Do you use drugs or alcohol to help forget problems? An answer of yes to any of these questions may indicate chemical dependency. Professional evaluation is suggested. Document Released: 08/14/2001 Document Revised: 11/12/2011 Document Reviewed: 10/26/2010 [facility] Patient Information 2015 [place], [place]. This information is not intended to replace advice given to you by your health care provider. Make sure you discuss any questions you have with your health care provider.

## 2014-07-13 NOTE — ED Provider Notes (Signed)
4:30 PM  Assumed care from Dr. Lynelle DoctorKnapp.  Pt is a 28 y.o. F who presents to the emergency department with 2 seizures today.  Possibly secondary to benzodiazepine withdrawal. She has follow-up scheduled with [place] and wellness. She was complaining of headache, head CT is pending. Will follow-up on head CT results and on patient's headache.   5:00 PM  Pt's head CT is unremarkable. She is still complaining of headache. We'll give Toradol, Phenergan. She has received Reglan, IV fluids, Benadryl, Ativan.    5:40 PM  Will dc pt home with mother and significant other. She has follow-up with [facility]. Dr. Lynelle DoctorKnapp Has Given the Patient a Refill for Klonopin and Follow[facility] Information with [facility].  Layla MawKristen N Ward, DO 07/13/14 1744

## 2014-07-13 NOTE — Progress Notes (Signed)
Chart reviewed.  Spoke with pt concerning his medications ans able to afford them.  Explained the MATCH- Medication Assistance program to pt. Pt understand that the [facility] is only a one time in one year from the date of discharge to next year. Pt also understand that there is a $3.00 co pay for each prescription. Pt also given information on [place] [facility] for PCP establishment and assistance obtaining an orange card.

## 2014-07-13 NOTE — ED Provider Notes (Signed)
CSN: [id number]     Arrival date & time 07/13/14  1215 History   First MD Initiated Contact with Patient 07/13/14 1221     Chief Complaint  Patient presents with  . Seizures     (Consider location/radiation/quality/duration/timing/severity/associated sxs/prior Treatment) HPI  Patient presents emergency department with her mother. Patient had a seizure when she was 28 years old when she was started on Depo-Provera. Her mother reports she was having periods that would not stop. She also has been on Klonopin and Adderall since she was 28 years old. She reports she does her primary care physician money and ran out of her Adderall and her Klonopin a month ago and has not been able to get them filled.she also missed her last dose of Depo-Provera which was also do a month ago. She had been doing well. Mother states this morning she was walking in the kitchen to get something to eat and she was looking at the cabinets. Her mother states she started taking small rapid steps forward, she then started taking rapid steps backward and then she fell backwards. Mother states she was "mildly stiff". She had some mild jerking. Mother states that lasted about 5 minutes. Patient states she has a right sided temple pressure that started about 8:30 this morning otherwise no injury from her fall. She denies any flashing lights. She denies nausea, vomiting, blurred vision, numbness or tingling of her extremities. She did start having vaginal bleeding 2 days ago and states her bleeding is heavy and spotting.  FH Mother of patient states she had seizures due to medications, including tramadol.     PCP Dr Nicholos Johns  Past Medical History  Diagnosis Date  . Seizures    Past Surgical History  Procedure Laterality Date  . Breast enhancement surgery     History reviewed. No pertinent family history. History  Substance Use Topics  . Smoking status: Never Smoker   . Smokeless tobacco: Never Used  . 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   OB History    No data available     Review of Systems  All other systems reviewed and are negative.     Allergies  Sulfa antibiotics  Home Medications   Prior to Admission medications   Medication Sig Start Date End Date Taking? Authorizing Provider  diphenhydrAMINE (BENADRYL) 25 mg capsule Take 50 mg by mouth at bedtime as needed for sleep.   Yes Historical Provider, MD  HYDROcodone-acetaminophen (NORCO/VICODIN) 5-325 MG per tablet Take 1 tablet by mouth every 4 (four) hours as needed for moderate pain. Patient not taking: Reported on 07/13/2014 12/08/13   Arie Sabina Schinlever, PA-C  ondansetron (ZOFRAN) 4 MG tablet Take 1 tablet (4 mg total) by mouth every 8 (eight) hours as needed for nausea or vomiting. Patient not taking: Reported on 07/13/2014 12/17/13   Trixie Dredge, PA-C  promethazine (PHENERGAN) 25 MG tablet Take 1 tablet (25 mg total) by mouth every 6 (six) hours as needed for nausea or vomiting. Patient not taking: Reported on 07/13/2014 12/08/13   Arie Sabina Schinlever, PA-C   BP 99/74 mmHg  Pulse 87  Temp(Src) 98.3 F (36.8 C) (Oral)  Resp 11  SpO2 97%  LMP 07/12/2014  Vital signs normal     Physical Exam  Constitutional: She is oriented to person, place, and time. She appears well-developed and well-nourished.  Non-toxic appearance. She does not  appear ill. No distress.  HENT:  Head: Normocephalic and atraumatic.  Right Ear: External ear normal.  Left Ear: External ear normal.  Nose: Nose normal. No mucosal edema or rhinorrhea.  Mouth/Throat: Oropharynx is clear and moist and mucous membranes are normal. No dental abscesses or uvula swelling.  Eyes: Conjunctivae and EOM are normal. Pupils are equal, round, and reactive to light.  Neck: Normal range of motion and full passive range of motion without pain. Neck supple.  Cardiovascular:  Normal rate, regular rhythm and normal heart sounds.  Exam reveals no gallop and no friction rub.   No murmur heard. Pulmonary/Chest: Effort normal and breath sounds normal. No respiratory distress. She has no wheezes. She has no rhonchi. She has no rales. She exhibits no tenderness and no crepitus.  Abdominal: Soft. Normal appearance and bowel sounds are normal. She exhibits no distension. There is no tenderness. There is no rebound and no guarding.  Musculoskeletal: Normal range of motion. She exhibits no edema or tenderness.  Moves all extremities well.   Neurological: She is alert and oriented to person, place, and time. She has normal strength. No cranial nerve deficit.  Skin: Skin is warm, dry and intact. No rash noted. No erythema. No pallor.  Pt has multiple pastel colored tatoos. Pt has manicured nails with add on plastic butterflies and jewels  Psychiatric: She has a normal mood and affect. Her speech is normal and behavior is normal. Her mood appears not anxious.  Nursing note and vitals reviewed.   ED Course  Procedures (including critical care time)  Medications  magnesium sulfate IVPB 2 g 50 mL (not administered)  sodium chloride 0.9 % bolus 1,000 mL (1,000 mLs Intravenous New Bag/Given 07/13/14 1420)  diphenhydrAMINE (BENADRYL) injection 25 mg (25 mg Intravenous Given 07/13/14 1419)  metoCLOPramide (REGLAN) injection 10 mg (10 mg Intravenous Given 07/13/14 1419)  LORazepam (ATIVAN) injection 1 mg (1 mg Intravenous Given 07/13/14 1523)   14:40Nursing staff report pt had a brief episode where she was staring and had some stiffening and minimal twitching, was post-ictal. Pt c/o worsening HA. [place] the [place] shows patient got #60 clonazepam on September 30 which should last 1 month, and #30 Adderall on September 30. This would mean she only ran out of her medications less than 2 weeks ago not the over a month but she told me before. Her mother and her boyfriend  do not know when she ran out of her medicine however her boyfriend thinks maybe 2 weeks ago. Her UDS is positive for benzos so patient may be getting her medication elsewhere. Patient was given Ativan and head CT were ordered.  Pt was seen by the case manager and [facility] on [facility]   16:35 Dr Elesa MassedWard will check on head CT and see if her headache is improved.   Labs Review Results for orders placed or performed during the hospital encounter of 07/13/14  Comprehensive metabolic panel  Result Value Ref Range   Sodium 141 137 - 147 mEq/L   Potassium 3.8 3.7 - 5.3 mEq/L   Chloride 104 96 - 112 mEq/L   CO2 26 19 - 32 mEq/L   Glucose, Bld 101 (H) 70 - 99 mg/dL   BUN 3 (L) 6 - 23 mg/dL   Creatinine, Ser 1.610.60 0.50 - 1.10 mg/dL   Calcium 9.5 8.4 - 09.610.5 mg/dL   Total Protein 6.7 6.0 - 8.3 g/dL   Albumin 4.2 3.5 -  5.2 g/dL   AST 15 0 - 37 U/L   ALT 10 0 - 35 U/L   Alkaline Phosphatase 52 39 - 117 U/L   Total Bilirubin 0.4 0.3 - 1.2 mg/dL   GFR calc non Af Amer >90 >90 mL/min   GFR calc Af Amer >90 >90 mL/min   Anion gap 11 5 - 15  CBC with Differential  Result Value Ref Range   WBC 11.5 (H) 4.0 - 10.5 K/uL   RBC 4.55 3.87 - 5.11 MIL/uL   Hemoglobin 14.2 12.0 - 15.0 g/dL   HCT 40.9 81.1 - 91.4 %   MCV 92.5 78.0 - 100.0 fL   MCH 31.2 26.0 - 34.0 pg   MCHC 33.7 30.0 - 36.0 g/dL   RDW 78.2 95.6 - 21.3 %   Platelets 289 150 - 400 K/uL   Neutrophils Relative % 78 (H) 43 - 77 %   Neutro Abs 9.0 (H) 1.7 - 7.7 K/uL   Lymphocytes Relative 14 12 - 46 %   Lymphs Abs 1.6 0.7 - 4.0 K/uL   Monocytes Relative 8 3 - 12 %   Monocytes Absolute 0.9 0.1 - 1.0 K/uL   Eosinophils Relative 0 0 - 5 %   Eosinophils Absolute 0.0 0.0 - 0.7 K/uL   Basophils Relative 0 0 - 1 %   Basophils Absolute 0.0 0.0 - 0.1 K/uL  Urinalysis, Routine w reflex microscopic  Result Value Ref Range   Color, Urine YELLOW YELLOW   APPearance CLEAR CLEAR   Specific Gravity, Urine 1.012 1.005 -  1.030   pH 7.0 5.0 - 8.0   Glucose, UA NEGATIVE NEGATIVE mg/dL   Hgb urine dipstick TRACE (A) NEGATIVE   Bilirubin Urine NEGATIVE NEGATIVE   Ketones, ur NEGATIVE NEGATIVE mg/dL   Protein, ur NEGATIVE NEGATIVE mg/dL   Urobilinogen, UA 0.2 0.0 - 1.0 mg/dL   Nitrite NEGATIVE NEGATIVE   Leukocytes, UA NEGATIVE NEGATIVE  Pregnancy, urine  Result Value Ref Range   Preg Test, Ur NEGATIVE NEGATIVE  Urine rapid drug screen (hosp performed)  Result Value Ref Range   Opiates NONE DETECTED NONE DETECTED   Cocaine NONE DETECTED NONE DETECTED   Benzodiazepines POSITIVE (A) NONE DETECTED   Amphetamines NONE DETECTED NONE DETECTED   Tetrahydrocannabinol POSITIVE (A) NONE DETECTED   Barbiturates NONE DETECTED NONE DETECTED  Urine microscopic-add on  Result Value Ref Range   Squamous Epithelial / LPF RARE RARE   WBC, UA 0-2 <3 WBC/hpf   RBC / HPF 0-2 <3 RBC/hpf   Bacteria, UA RARE RARE  CBG monitoring, ED  Result Value Ref Range   Glucose-Capillary 99 70 - 99 mg/dL   Laboratory interpretation all normal except + UDS and leukocytosis     Imaging Review No results found.   EKG Interpretation   Date/Time:  Tuesday July 13 2014 12:37:30 EST Ventricular Rate:  87 PR Interval:  126 QRS Duration: 80 QT Interval:  361 QTC Calculation: 434 R Axis:   62 Text Interpretation:  Age not entered, assumed to be  28 years old for  purpose of ECG interpretation Sinus rhythm Since last tracing rate slower  (07 Jan 2005) Confirmed by [facility]  MD-I, Uzoma Vivona ([id number]) on 07/13/2014 1:56:13  PM      MDM   Final diagnoses:  Withdrawal seizures, uncomplicated  Benzodiazepine dependence    Plan discharge unless her Head CT is positive.   Modified Medications   Modified Medication Previous Medication   AMPHETAMINE-DEXTROAMPHETAMINE (ADDERALL XR) 15 MG  24 HR CAPSULE amphetamine-dextroamphetamine (ADDERALL XR) 15 MG 24 hr capsule      Take 1 capsule by mouth every morning.    Take 15 mg by mouth  every morning.   CLONAZEPAM (KLONOPIN) 1 MG TABLET clonazePAM (KLONOPIN) 1 MG tablet      Take 1 tablet (1 mg total) by mouth 2 (two) times daily as needed for anxiety. For anxiety    Take 1 mg by mouth daily as needed. For anxiety    Devoria AlbeIva Esmerelda Finnigan, MD, Armando GangFACEP      Ward GivensIva L Tonja Jezewski, MD 07/13/14 1640

## 2014-07-13 NOTE — ED Notes (Signed)
Placed pt on 2 liters of oxygen (nasal cannula) due to anxiety pt stating that is feels like her heart is racing and she cant breath RN notified

## 2014-07-13 NOTE — ED Notes (Signed)
Pt reported she feels comfortable walking to the bathroom, walks to bathroom with no problem, door left unlocked so I could assist if needed. Pt back in bed with no problems, SPO2 monitor in place and pt begins having seizure lasting approximately 30 seconds to 1 minute. Pt came out of seizure, O2 saturation stable at 96%. Post-ictal at this time.

## 2014-07-13 NOTE — ED Notes (Addendum)
Pt presents to ED via EMS for seizure. Mother reports that pt was walking toward the kitchen, started shaking, and fell and that pt was not responsive for about 5 minutes. Pt states she started having right sided head pressure around 800 today prior to seizure. Per EMS, BP-102/60, HR-90, RR-26, SpO2-98% on room air. Pt states she has not been taking her anxiety, adderall and klonopin for a month. Pt alerts and oriented x4 at this time, airway intact.

## 2015-10-06 IMAGING — CT CT HEAD W/O CM
2 series · 16 of 30 positions shown, 18 images · non-contrast
Comparison: None.

CLINICAL DATA: Seizures with fall.

EXAM:
CT HEAD WITHOUT CONTRAST
TECHNIQUE: Contiguous axial images were obtained from the base of the skull
through the vertex without intravenous contrast.

[Series 201: head w/o, idose (1) · axial · non-contrast · 0.40mm/px · z∈[+97,+212]mm · 8 of 31 slices shown, 10 images]
[im 4/31  brain]
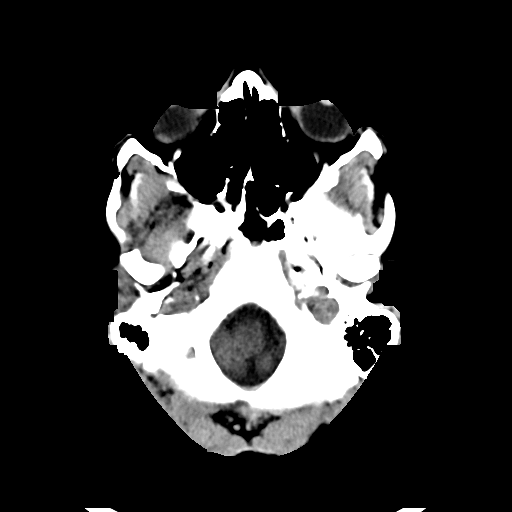
[im 4/31  bone]
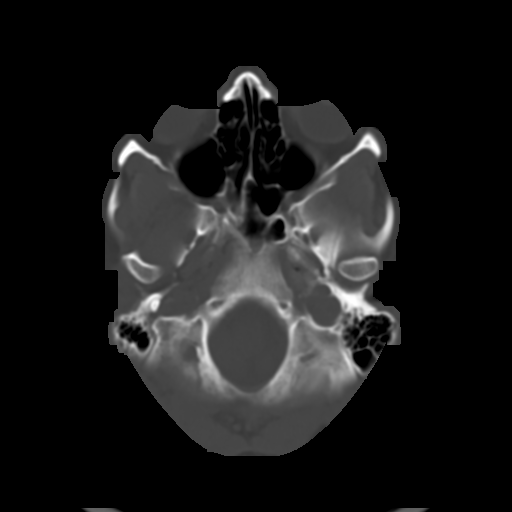
[im 7/31  brain]
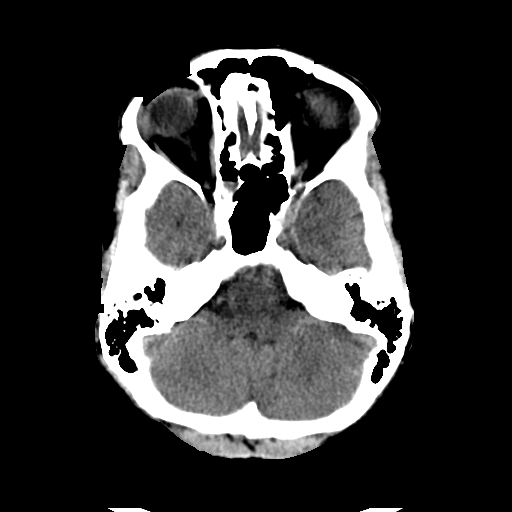
[im 11/31  brain]
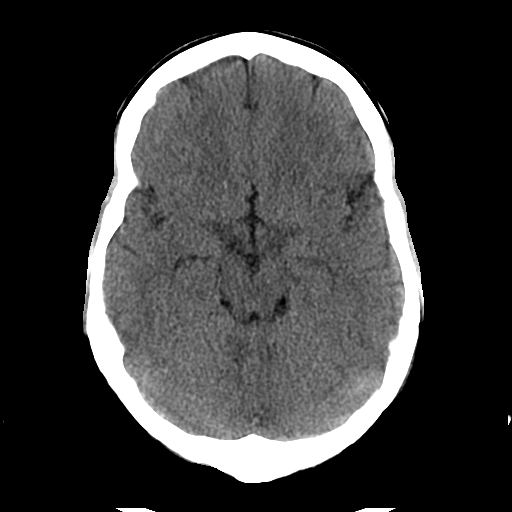
[im 14/31  brain]
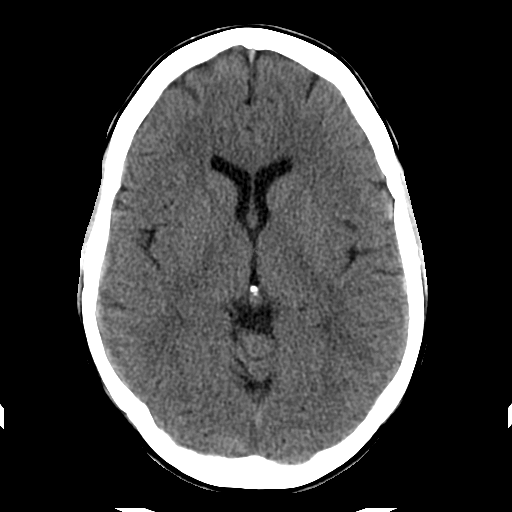
[im 17/31  brain]
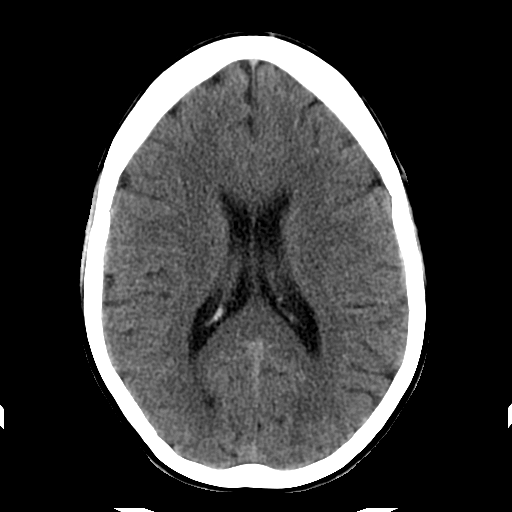
[im 17/31  bone]
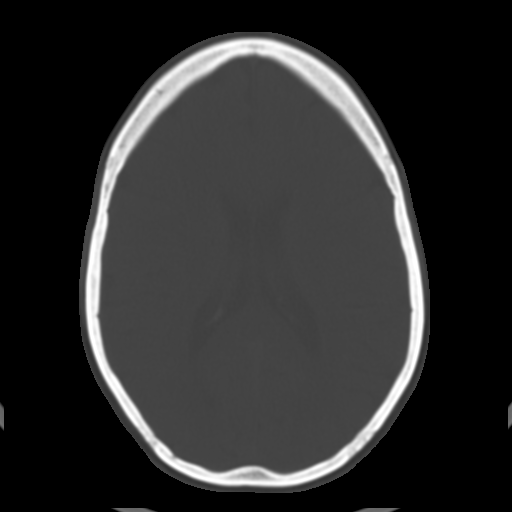
[im 21/31  brain]
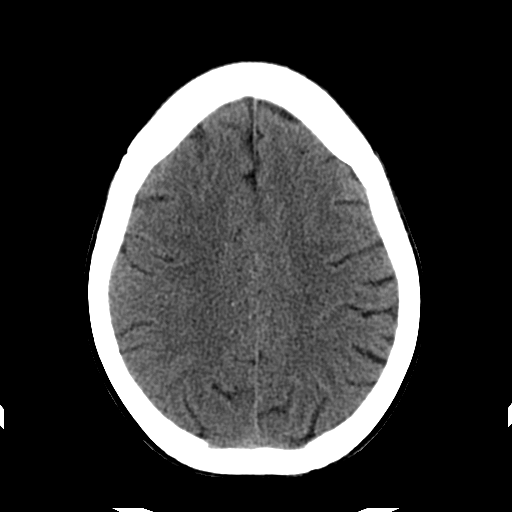
[im 24/31  brain]
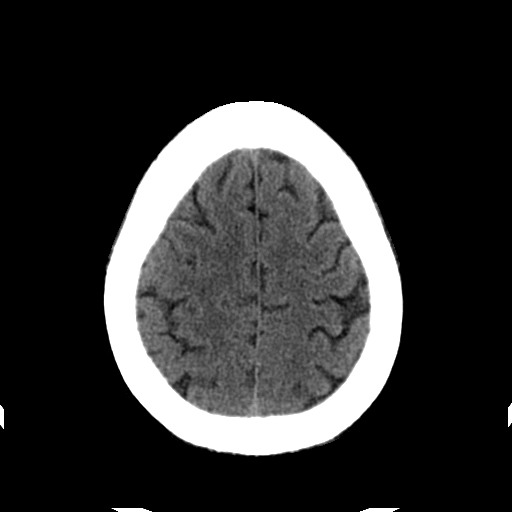
[im 27/31  brain]
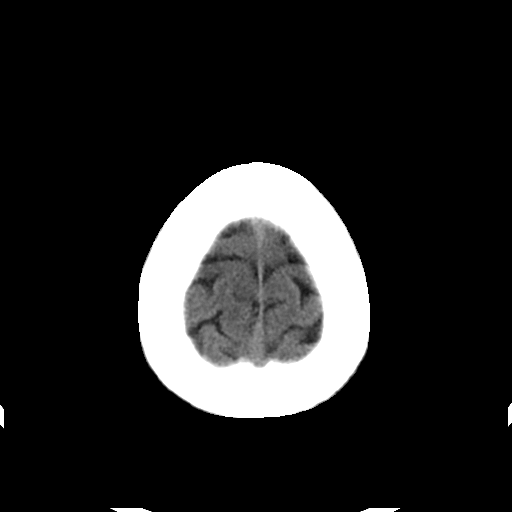

[Series 202: head w/o bone, idose (1) · axial · non-contrast · 0.40mm/px · z∈[+96,+216]mm · 8 of 62 slices shown]
[im 7/62  bone]
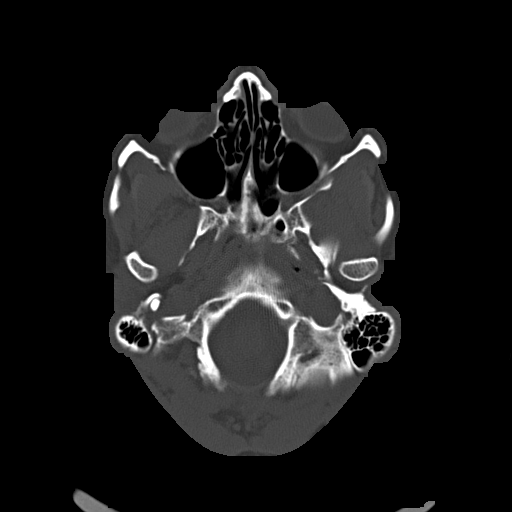
[im 13/62  bone]
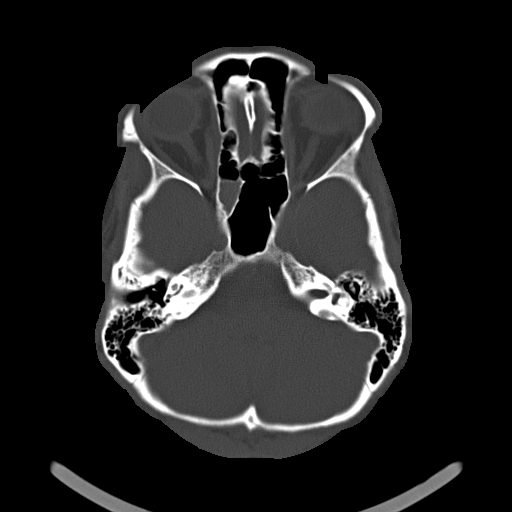
[im 20/62  bone]
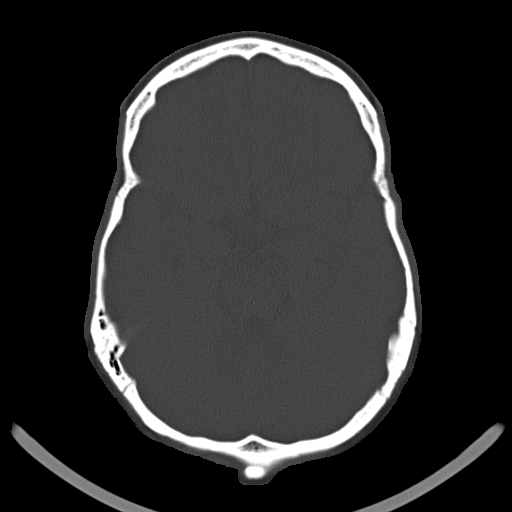
[im 26/62  bone]
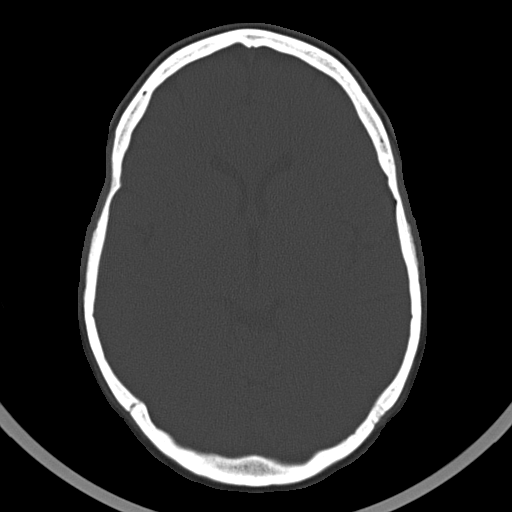
[im 36/62  bone]
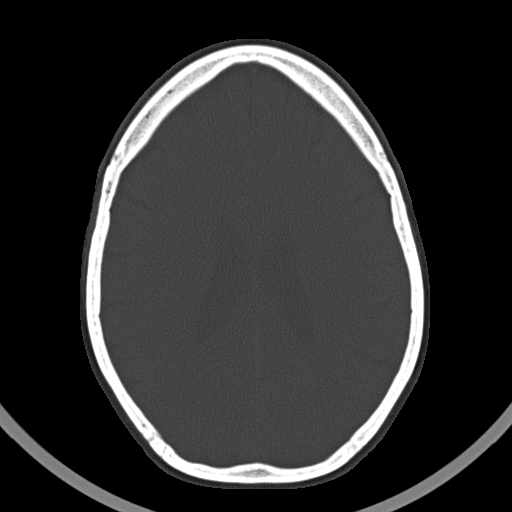
[im 42/62  bone]
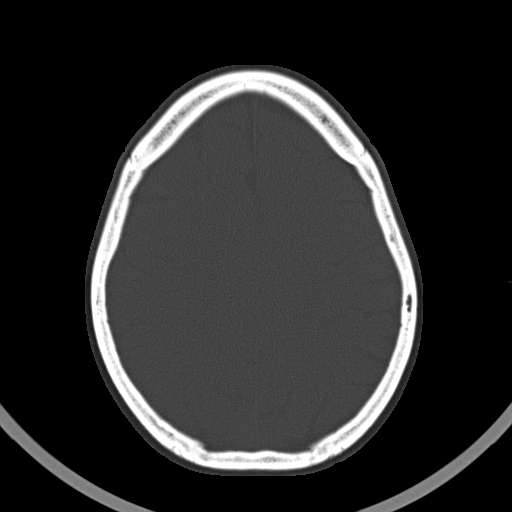
[im 49/62  bone]
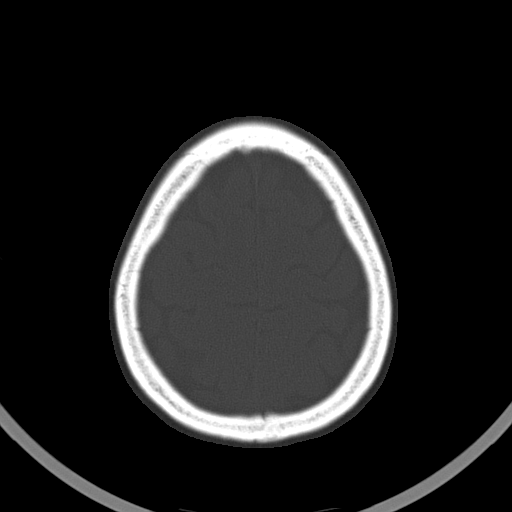
[im 55/62  bone]
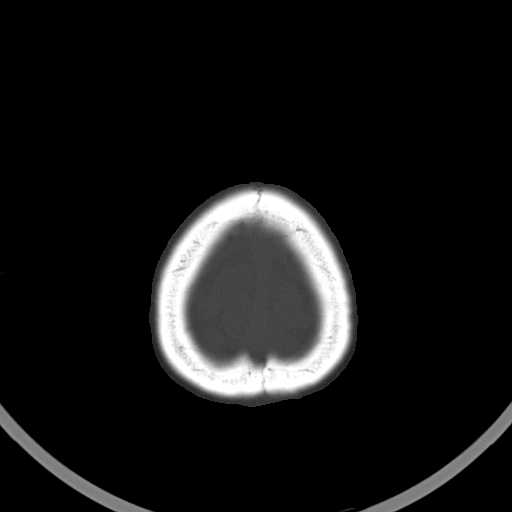

[16 of 30 positions shown; findings below may reference images not displayed]

FINDINGS: Skull and Sinuses:Nonobstructive 4 mm osteoma within a posterior
right ethmoid air cell.

There is complete opacification of the small right sphenoid sinus
with high-density contents, likely inspissated secretion. No bowing
of the wall suggestive of a mucocele.

Orbits: No acute abnormality.

Brain: No evidence of acute abnormality, such as acute infarction,
hemorrhage (apparent high attenuation along the posterior left
frontal convexity has an appearance most consistent with streak
artifact), hydrocephalus, or mass lesion/mass effect. No cortical
findings to explain seizure.
IMPRESSION: 1. No acute intracranial findings.
2. Chronic right sphenoid sinusitis.

## 2016-09-03 NOTE — L&D Delivery Note (Signed)
Patient is 31 y.o. G1P1000 [id number] admitted for IOL for IUGR. S/p IOL with foley bulb, cytotec, followed by Pitocin. AROM at 1815.  Prenatal course also complicated by [facility], depression/anxiety, and late prenatal care.  Delivery Note At 12:14 AM a viable female was delivered via Vaginal, Vacuum (Extractor) (Presentation: OA  ).  APGAR: 8, 9; weight 2761g.   Placenta status: Intact .  Cord: 3V  with the following complications: None.  Cord pH: N/A  Anesthesia:  Epidural Episiotomy: None Lacerations: None Est. Blood Loss (mL): 200  Mom to postpartum.  Baby to Couplet care / Skin to Skin.   Upon arrival patient was complete and pushing. Noticed to have repetitive, prolonged, and consistent late decels down to 80. Patient pushing, and despite good maternal effort, unable to deliver fetal head, and decision made for vacuum-assisted delivery. The soft vacuum soft cup was positioned over the sagittal suture 3 cm anterior to posterior fontanelle.  Pressure was then increased to 500 mmHg, and the patient was instructed to push.  Pulling was administered along the pelvic curve. Baby delivered without difficulty, was noted to have good tone and place on maternal abdomen for oral suctioning, drying and stimulation. Delayed cord clamping performed. Placenta delivered spontaneously with gentle cord traction. Fundus firm with massage and Pitocin. Perineum inspected and found to have to be hemostatic. Counts of sharps, instruments, and lap pads were all correct.   Caryl AdaJazma Ripley Bogosian, DO OB Fellow Faculty Practice, [facility] [place] 04/17/2017, 1:52 AM

## 2016-10-22 ENCOUNTER — Encounter (HOSPITAL_COMMUNITY): Payer: Self-pay | Admitting: *Deleted

## 2016-10-22 ENCOUNTER — Inpatient Hospital Stay (HOSPITAL_COMMUNITY)
Admission: AD | Admit: 2016-10-22 | Discharge: 2016-10-22 | Disposition: A | Discharge status: Home / Self Care | Payer: Medicaid Other | Source: Ambulatory Visit | Attending: Obstetrics & Gynecology | Admitting: Obstetrics & Gynecology

## 2016-10-22 DIAGNOSIS — Z87891 Personal history of nicotine dependence: Secondary | ICD-10-CM | POA: Diagnosis not present

## 2016-10-22 DIAGNOSIS — Z882 Allergy status to sulfonamides status: Secondary | ICD-10-CM | POA: Diagnosis not present

## 2016-10-22 DIAGNOSIS — O219 Vomiting of pregnancy, unspecified: Secondary | ICD-10-CM

## 2016-10-22 DIAGNOSIS — O26892 Other specified pregnancy related conditions, second trimester: Secondary | ICD-10-CM | POA: Diagnosis not present

## 2016-10-22 DIAGNOSIS — R1030 Lower abdominal pain, unspecified: Secondary | ICD-10-CM | POA: Diagnosis not present

## 2016-10-22 DIAGNOSIS — Z3A15 15 weeks gestation of pregnancy: Secondary | ICD-10-CM | POA: Insufficient documentation

## 2016-10-22 DIAGNOSIS — R109 Unspecified abdominal pain: Secondary | ICD-10-CM

## 2016-10-22 LAB — CBC WITH DIFFERENTIAL/PLATELET
BASOS ABS: 0 10*3/uL (ref 0.0–0.1)
Basophils Relative: 0 %
EOS PCT: 0 %
Eosinophils Absolute: 0 10*3/uL (ref 0.0–0.7)
HCT: 36.3 % (ref 36.0–46.0)
Hemoglobin: 12.4 g/dL (ref 12.0–15.0)
LYMPHS ABS: 1.6 10*3/uL (ref 0.7–4.0)
LYMPHS PCT: 10 %
MCH: 31.3 pg (ref 26.0–34.0)
MCHC: 34.2 g/dL (ref 30.0–36.0)
MCV: 91.7 fL (ref 78.0–100.0)
Monocytes Absolute: 0.3 10*3/uL (ref 0.1–1.0)
Monocytes Relative: 2 %
NEUTROS ABS: 13.7 10*3/uL — AB (ref 1.7–7.7)
Neutrophils Relative %: 88 %
Platelets: 270 10*3/uL (ref 150–400)
RBC: 3.96 MIL/uL (ref 3.87–5.11)
RDW: 13.8 % (ref 11.5–15.5)
WBC: 15.7 10*3/uL — AB (ref 4.0–10.5)

## 2016-10-22 LAB — URINALYSIS, ROUTINE W REFLEX MICROSCOPIC
Bacteria, UA: NONE SEEN
Bilirubin Urine: NEGATIVE
GLUCOSE, UA: NEGATIVE mg/dL
Ketones, ur: NEGATIVE mg/dL
NITRITE: NEGATIVE
PROTEIN: NEGATIVE mg/dL
SPECIFIC GRAVITY, URINE: 1.004 — AB (ref 1.005–1.030)
pH: 7 (ref 5.0–8.0)

## 2016-10-22 LAB — COMPREHENSIVE METABOLIC PANEL
ALK PHOS: 36 U/L — AB (ref 38–126)
ALT: 10 U/L — AB (ref 14–54)
AST: 14 U/L — AB (ref 15–41)
Albumin: 3.8 g/dL (ref 3.5–5.0)
Anion gap: 7 (ref 5–15)
BILIRUBIN TOTAL: 0.5 mg/dL (ref 0.3–1.2)
BUN: 5 mg/dL — AB (ref 6–20)
CALCIUM: 9 mg/dL (ref 8.9–10.3)
CHLORIDE: 103 mmol/L (ref 101–111)
CO2: 25 mmol/L (ref 22–32)
CREATININE: 0.48 mg/dL (ref 0.44–1.00)
Glucose, Bld: 102 mg/dL — ABNORMAL HIGH (ref 65–99)
Potassium: 3.8 mmol/L (ref 3.5–5.1)
Sodium: 135 mmol/L (ref 135–145)
TOTAL PROTEIN: 6.6 g/dL (ref 6.5–8.1)

## 2016-10-22 LAB — RAPID URINE DRUG SCREEN, HOSP PERFORMED
Amphetamines: NOT DETECTED
BENZODIAZEPINES: NOT DETECTED
Barbiturates: NOT DETECTED
COCAINE: NOT DETECTED
OPIATES: NOT DETECTED
Tetrahydrocannabinol: POSITIVE — AB

## 2016-10-22 LAB — WET PREP, GENITAL
SPERM: NONE SEEN
Trich, Wet Prep: NONE SEEN
Yeast Wet Prep HPF POC: NONE SEEN

## 2016-10-22 LAB — LIPASE, BLOOD: LIPASE: 31 U/L (ref 11–51)

## 2016-10-22 MED ORDER — METOCLOPRAMIDE HCL 10 MG PO TABS: 10.0000 mg | ORAL_TABLET | Freq: Four times a day (QID) | ORAL | 0 refills | Status: DC

## 2016-10-22 MED ORDER — ONDANSETRON 4 MG PO TBDP
4.0000 mg | ORAL_TABLET | Freq: Once | ORAL | Status: AC
  Administered 2016-10-22: 4 mg via ORAL
  Filled 2016-10-22: qty 1

## 2016-10-22 MED ORDER — METOCLOPRAMIDE HCL 10 MG PO TABS: 10.0000 mg | ORAL_TABLET | Freq: Three times a day (TID) | ORAL | Status: DC

## 2016-10-22 MED ORDER — IBUPROFEN 600 MG PO TABS
600.0000 mg | ORAL_TABLET | Freq: Once | ORAL | Status: AC
  Administered 2016-10-22: 600 mg via ORAL
  Filled 2016-10-22: qty 1

## 2016-10-22 NOTE — Discharge Instructions (Signed)
Abdominal Pain During Pregnancy °Belly (abdominal) pain is common during pregnancy. Most of the time, it is not a serious problem. Other times, it can be a sign that something is wrong with the pregnancy. Always tell your doctor if you have belly pain. °Follow these instructions at home: °Monitor your belly pain for any changes. The following actions may help you feel better: °· Do not have sex (intercourse) or put anything in your vagina until you feel better. °· Rest until your pain stops. °· Drink clear fluids if you feel sick to your stomach (nauseous). Do not eat solid food until you feel better. °· Only take medicine as told by your doctor. °· Keep all doctor visits as told. °Get help right away if: °· You are bleeding, leaking fluid, or pieces of tissue come out of your vagina. °· You have more pain or cramping. °· You keep throwing up (vomiting). °· You have pain when you pee (urinate) or have blood in your pee. °· You have a fever. °· You do not feel your baby moving as much. °· You feel very weak or feel like passing out. °· You have trouble breathing, with or without belly pain. °· You have a very bad headache and belly pain. °· You have fluid leaking from your vagina and belly pain. °· You keep having watery poop (diarrhea). °· Your belly pain does not go away after resting, or the pain gets worse. °This information is not intended to replace advice given to you by your health care provider. Make sure you discuss any questions you have with your health care provider. °Document Released: 08/08/2009 Document Revised: 03/28/2016 Document Reviewed: 03/19/2013 °Elsevier Interactive Patient Education © 2017 [employer] Inc[employer] ° °

## 2016-10-22 NOTE — MAU Note (Signed)
Pt presents to MAU with complaints of pain in the right lower side of her abdomen on and off for a couple of weeks. Pt denies any VB or abnormal discharge. [facility]

## 2016-10-22 NOTE — MAU Provider Note (Signed)
History     CSN: [id number]  Arrival date and time: 10/22/16 1230   None     Chief Complaint  Patient presents with  . Abdominal Pain   HPI   [name] is 31 y.o. female G1P0 @ 84w4dhere in MAU with complaints of abdominal pain. The pain is located in her lower abdomen and in the center of her abdomen. The pain started last week. The pain comes and goes.  The pain does not worsen when she walks. She has not started prenatal care and is nervous about the pregnancy.  She has had n/v throughout the pregnancy, this is not a new symptom.   She took one tylenol last night for the pain.   OB History    Gravida Para Term Preterm AB Living   1             SAB TAB Ectopic Multiple Live Births                  Past Medical History:  Diagnosis Date  . Seizures ([facility]     Past Surgical History:  Procedure Laterality Date  . BREAST ENHANCEMENT SURGERY      History reviewed. No pertinent family history.  Social History  Substance Use Topics  . Smoking status: Former Smoker    Packs/day: 0.25    Years: 0.50    Types: Cigarettes    Quit date: 06/21/2016  . Smokeless tobacco: Never Used  . Alcohol use No    Allergies:  Allergies  Allergen Reactions  . Sulfa Antibiotics Nausea And Vomiting    Prescriptions Prior to Admission  Medication Sig Dispense Refill Last Dose  . diphenhydramine-acetaminophen (TYLENOL PM) 25-500 MG TABS tablet Take 1 tablet by mouth at bedtime as needed.   10/21/2016 at Unknown time  . amphetamine-dextroamphetamine (ADDERALL XR) 15 MG 24 hr capsule Take 1 capsule by mouth every morning. (Patient not taking: Reported on 10/22/2016) 7 capsule 0 Not Taking at Unknown time  . clonazePAM (KLONOPIN) 1 MG tablet Take 1 tablet (1 mg total) by mouth 2 (two) times daily as needed for anxiety. For anxiety (Patient not taking: Reported on 10/22/2016) 14 tablet 0 Not Taking at Unknown time   Results for orders placed or performed during the hospital  encounter of 10/22/16 (from the past 48 hour(s))  Urinalysis, Routine w reflex microscopic     Status: Abnormal   Collection Time: 10/22/16 12:35 PM  Result Value Ref Range   Color, Urine STRAW (A) YELLOW   APPearance HAZY (A) CLEAR   Specific Gravity, Urine 1.004 (L) 1.005 - 1.030   pH 7.0 5.0 - 8.0   Glucose, UA NEGATIVE NEGATIVE mg/dL   Hgb urine dipstick SMALL (A) NEGATIVE   Bilirubin Urine NEGATIVE NEGATIVE   Ketones, ur NEGATIVE NEGATIVE mg/dL   Protein, ur NEGATIVE NEGATIVE mg/dL   Nitrite NEGATIVE NEGATIVE   Leukocytes, UA TRACE (A) NEGATIVE   RBC / HPF 0-5 0 - 5 RBC/hpf   WBC, UA 0-5 0 - 5 WBC/hpf   Bacteria, UA NONE SEEN NONE SEEN   Squamous Epithelial / LPF 6-30 (A) NONE SEEN   Mucous PRESENT   Urine rapid drug screen (hosp performed)     Status: Abnormal   Collection Time: 10/22/16 12:35 PM  Result Value Ref Range   Opiates NONE DETECTED NONE DETECTED   Cocaine NONE DETECTED NONE DETECTED   Benzodiazepines NONE DETECTED NONE DETECTED   Amphetamines NONE DETECTED NONE DETECTED  Tetrahydrocannabinol POSITIVE (A) NONE DETECTED   Barbiturates NONE DETECTED NONE DETECTED    Comment:        DRUG SCREEN FOR MEDICAL PURPOSES ONLY.  IF CONFIRMATION IS NEEDED FOR ANY PURPOSE, NOTIFY LAB WITHIN 5 DAYS.        LOWEST DETECTABLE LIMITS FOR URINE DRUG SCREEN Drug Class       Cutoff (ng/mL) Amphetamine      1000 Barbiturate      200 Benzodiazepine   032 Tricyclics       122 Opiates          300 Cocaine          300 THC              50   CBC with Differential/Platelet     Status: Abnormal   Collection Time: 10/22/16  2:50 PM  Result Value Ref Range   WBC 15.7 (H) 4.0 - 10.5 K/uL   RBC 3.96 3.87 - 5.11 MIL/uL   Hemoglobin 12.4 12.0 - 15.0 g/dL   HCT 36.3 36.0 - 46.0 %   MCV 91.7 78.0 - 100.0 fL   MCH 31.3 26.0 - 34.0 pg   MCHC 34.2 30.0 - 36.0 g/dL   RDW 13.8 11.5 - 15.5 %   Platelets 270 150 - 400 K/uL   Neutrophils Relative % 88 %   Neutro Abs 13.7 (H) 1.7 -  7.7 K/uL   Lymphocytes Relative 10 %   Lymphs Abs 1.6 0.7 - 4.0 K/uL   Monocytes Relative 2 %   Monocytes Absolute 0.3 0.1 - 1.0 K/uL   Eosinophils Relative 0 %   Eosinophils Absolute 0.0 0.0 - 0.7 K/uL   Basophils Relative 0 %   Basophils Absolute 0.0 0.0 - 0.1 K/uL  Comprehensive metabolic panel     Status: Abnormal   Collection Time: 10/22/16  2:50 PM  Result Value Ref Range   Sodium 135 135 - 145 mmol/L   Potassium 3.8 3.5 - 5.1 mmol/L   Chloride 103 101 - 111 mmol/L   CO2 25 22 - 32 mmol/L   Glucose, Bld 102 (H) 65 - 99 mg/dL   BUN 5 (L) 6 - 20 mg/dL   Creatinine, Ser 0.48 0.44 - 1.00 mg/dL   Calcium 9.0 8.9 - 10.3 mg/dL   Total Protein 6.6 6.5 - 8.1 g/dL   Albumin 3.8 3.5 - 5.0 g/dL   AST 14 (L) 15 - 41 U/L   ALT 10 (L) 14 - 54 U/L   Alkaline Phosphatase 36 (L) 38 - 126 U/L   Total Bilirubin 0.5 0.3 - 1.2 mg/dL   GFR calc non Af Amer >60 >60 mL/min   GFR calc Af Amer >60 >60 mL/min    Comment: (NOTE) The eGFR has been calculated using the CKD EPI equation. This calculation has not been validated in all clinical situations. eGFR's persistently <60 mL/min signify possible Chronic Kidney Disease.    Anion gap 7 5 - 15  Lipase, blood     Status: None   Collection Time: 10/22/16  2:50 PM  Result Value Ref Range   Lipase 31 11 - 51 U/L  Wet prep, genital     Status: Abnormal   Collection Time: 10/22/16  4:50 PM  Result Value Ref Range   Yeast Wet Prep HPF POC NONE SEEN NONE SEEN   Trich, Wet Prep NONE SEEN NONE SEEN   Clue Cells Wet Prep HPF POC PRESENT (A) NONE SEEN   WBC, Wet  Prep HPF POC MODERATE (A) NONE SEEN    Comment: MANY BACTERIA SEEN   Sperm NONE SEEN     Review of Systems  Constitutional: Negative for fever.  Gastrointestinal: Positive for abdominal pain. Negative for constipation and diarrhea.  Genitourinary: Negative for dysuria.  Psychiatric/Behavioral: Nervous/anxious:     Physical Exam   Blood pressure (!) 102/48, pulse 93, temperature 98.2 F  (36.8 C), temperature source Oral, resp. rate 16, last menstrual period 07/05/2016, SpO2 100 %.  Physical Exam  Constitutional: She is oriented to person, place, and time. She appears well-developed and well-nourished. No distress.  Respiratory: Effort normal.  GI: Soft. She exhibits no distension and no mass. There is no tenderness. There is no rebound and no guarding.  Genitourinary:  Genitourinary Comments: Vagina - Small amount of white vaginal discharge, no odor  Cervix - No contact bleeding, no active bleeding  Bimanual exam: Cervix closed Uterus non tender, gravid  GC/Chlam, wet prep done Chaperone present for exam.   Neurological: She is alert and oriented to person, place, and time.  Skin: Skin is warm. She is not diaphoretic.  Psychiatric: Her behavior is normal.    MAU Course  Procedures  None  MDM + fetal heart tones.  Cbc CMP Lipase Zofran 4 mg PO Ibuprofen 600 mg PO. Pain down to a 4/10. Patient requesting to eat a sub that her fiance brought her.   Assessment and Plan   A:  1. Abdominal pain in pregnancy, second trimester   2. Nausea and vomiting in pregnancy     P:  Discharge home in stable condition  Strict return precautions  Start prenatal care Rx: Reglan  Increase PO fluid intake    Lezlie Lye, NP 10/22/2016 7:15 PM

## 2016-10-23 LAB — HIV ANTIBODY (ROUTINE TESTING W REFLEX): HIV Screen 4th Generation wRfx: NONREACTIVE

## 2016-10-23 LAB — GC/CHLAMYDIA PROBE AMP (~~LOC~~) NOT AT ARMC
Chlamydia: NEGATIVE
Neisseria Gonorrhea: NEGATIVE

## 2016-11-02 ENCOUNTER — Emergency Department (HOSPITAL_COMMUNITY)
Admission: EM | Admit: 2016-11-02 | Discharge: 2016-11-02 | Disposition: A | Discharge status: Home / Self Care | Payer: Medicaid Other | Attending: Emergency Medicine | Admitting: Emergency Medicine

## 2016-11-02 ENCOUNTER — Encounter (HOSPITAL_COMMUNITY): Payer: Self-pay | Admitting: Emergency Medicine

## 2016-11-02 DIAGNOSIS — O0281 Inappropriate change in quantitative human chorionic gonadotropin (hCG) in early pregnancy: Secondary | ICD-10-CM | POA: Insufficient documentation

## 2016-11-02 DIAGNOSIS — Z791 Long term (current) use of non-steroidal anti-inflammatories (NSAID): Secondary | ICD-10-CM | POA: Diagnosis not present

## 2016-11-02 DIAGNOSIS — Z87891 Personal history of nicotine dependence: Secondary | ICD-10-CM | POA: Insufficient documentation

## 2016-11-02 DIAGNOSIS — Z3A17 17 weeks gestation of pregnancy: Secondary | ICD-10-CM | POA: Insufficient documentation

## 2016-11-02 DIAGNOSIS — O99282 Endocrine, nutritional and metabolic diseases complicating pregnancy, second trimester: Secondary | ICD-10-CM | POA: Diagnosis not present

## 2016-11-02 DIAGNOSIS — E876 Hypokalemia: Secondary | ICD-10-CM | POA: Diagnosis not present

## 2016-11-02 DIAGNOSIS — O210 Mild hyperemesis gravidarum: Secondary | ICD-10-CM | POA: Diagnosis not present

## 2016-11-02 DIAGNOSIS — Z79899 Other long term (current) drug therapy: Secondary | ICD-10-CM | POA: Diagnosis not present

## 2016-11-02 LAB — COMPREHENSIVE METABOLIC PANEL
ALBUMIN: 4.1 g/dL (ref 3.5–5.0)
ALK PHOS: 37 U/L — AB (ref 38–126)
ALT: 10 U/L — ABNORMAL LOW (ref 14–54)
ANION GAP: 8 (ref 5–15)
AST: 16 U/L (ref 15–41)
BILIRUBIN TOTAL: 0.4 mg/dL (ref 0.3–1.2)
CO2: 26 mmol/L (ref 22–32)
Calcium: 9.1 mg/dL (ref 8.9–10.3)
Chloride: 101 mmol/L (ref 101–111)
Creatinine, Ser: 0.51 mg/dL (ref 0.44–1.00)
GFR calc Af Amer: >60 mL/min (ref >60)
GFR calc non Af Amer: >60 mL/min (ref >60)
GLUCOSE: 91 mg/dL (ref 65–99)
Potassium: 2.8 mmol/L — ABNORMAL LOW (ref 3.5–5.1)
Sodium: 135 mmol/L (ref 135–145)
TOTAL PROTEIN: 7.4 g/dL (ref 6.5–8.1)

## 2016-11-02 LAB — URINALYSIS, ROUTINE W REFLEX MICROSCOPIC
BILIRUBIN URINE: NEGATIVE
Glucose, UA: NEGATIVE mg/dL
KETONES UR: 40 mg/dL — AB
Leukocytes, UA: NEGATIVE
NITRITE: NEGATIVE
Specific Gravity, Urine: 1.01 (ref 1.005–1.030)
pH: 7 (ref 5.0–8.0)

## 2016-11-02 LAB — CBC WITH DIFFERENTIAL/PLATELET
BASOS PCT: 0 %
Basophils Absolute: 0 10*3/uL (ref 0.0–0.1)
EOS ABS: 0 10*3/uL (ref 0.0–0.7)
EOS PCT: 0 %
HCT: 40.1 % (ref 36.0–46.0)
Hemoglobin: 13.9 g/dL (ref 12.0–15.0)
Lymphocytes Relative: 9 %
Lymphs Abs: 1 10*3/uL (ref 0.7–4.0)
MCH: 31.7 pg (ref 26.0–34.0)
MCHC: 34.7 g/dL (ref 30.0–36.0)
MCV: 91.3 fL (ref 78.0–100.0)
MONOS PCT: 5 %
Monocytes Absolute: 0.5 10*3/uL (ref 0.1–1.0)
NEUTROS PCT: 86 %
Neutro Abs: 8.9 10*3/uL — ABNORMAL HIGH (ref 1.7–7.7)
PLATELETS: 284 10*3/uL (ref 150–400)
RBC: 4.39 MIL/uL (ref 3.87–5.11)
RDW: 13 % (ref 11.5–15.5)
WBC: 10.4 10*3/uL (ref 4.0–10.5)

## 2016-11-02 LAB — URINALYSIS, MICROSCOPIC (REFLEX): WBC, UA: NONE SEEN WBC/hpf (ref 0–5)

## 2016-11-02 LAB — LIPASE, BLOOD: Lipase: 24 U/L (ref 11–51)

## 2016-11-02 LAB — HCG, QUANTITATIVE, PREGNANCY: HCG, BETA CHAIN, QUANT, S: 41492 m[IU]/mL — AB (ref <5)

## 2016-11-02 LAB — ABO/RH: ABO/RH(D): A POS

## 2016-11-02 MED ORDER — POTASSIUM CHLORIDE CRYS ER 20 MEQ PO TBCR
40.0000 meq | EXTENDED_RELEASE_TABLET | Freq: Once | ORAL | Status: DC
  Filled 2016-11-02: qty 2

## 2016-11-02 MED ORDER — POTASSIUM CHLORIDE CRYS ER 20 MEQ PO TBCR: 20.0000 meq | EXTENDED_RELEASE_TABLET | Freq: Two times a day (BID) | ORAL | 0 refills | Status: DC

## 2016-11-02 MED ORDER — POTASSIUM CHLORIDE 20 MEQ PO PACK
PACK | ORAL | Status: AC
  Filled 2016-11-02: qty 2

## 2016-11-02 MED ORDER — POTASSIUM CHLORIDE 20 MEQ PO PACK
40.0000 meq | PACK | Freq: Once | ORAL | Status: AC
Start: 2016-11-02 — End: 2016-11-02
  Administered 2016-11-02: 40 meq via ORAL

## 2016-11-02 MED ORDER — POTASSIUM CHLORIDE 10 MEQ/100ML IV SOLN
10.0000 meq | Freq: Once | INTRAVENOUS | Status: AC
  Administered 2016-11-02: 10 meq via INTRAVENOUS
  Filled 2016-11-02: qty 100

## 2016-11-02 MED ORDER — MAGNESIUM SULFATE IN D5W 1-5 GM/100ML-% IV SOLN
1.0000 g | Freq: Once | INTRAVENOUS | Status: AC
  Administered 2016-11-02: 1 g via INTRAVENOUS
  Filled 2016-11-02: qty 100

## 2016-11-02 MED ORDER — ONDANSETRON HCL 4 MG PO TABS: 4.0000 mg | ORAL_TABLET | Freq: Four times a day (QID) | ORAL | 0 refills | Status: DC

## 2016-11-02 MED ORDER — SODIUM CHLORIDE 0.9 % IV BOLUS (SEPSIS)
1000.0000 mL | Freq: Once | INTRAVENOUS | Status: AC
  Administered 2016-11-02: 1000 mL via INTRAVENOUS

## 2016-11-02 MED ORDER — ONDANSETRON HCL 4 MG/2ML IJ SOLN
4.0000 mg | Freq: Once | INTRAMUSCULAR | Status: AC
  Administered 2016-11-02: 4 mg via INTRAVENOUS
  Filled 2016-11-02: qty 2

## 2016-11-02 NOTE — Discharge Instructions (Signed)
Small, frequent sips of fluids.  Call [facility] to arrange a follow-up appt. Return here for any worsening symptoms such as vaginal bleeding, lower abdominal pain or fever.

## 2016-11-02 NOTE — ED Triage Notes (Signed)
PT c/o emesis during pregnancy for over the past few months. PT states she is 17-[redacted] weeks gestation [facility] but doesn't have any prenatal care yet due to no insurance but states [facility]. Due date 04/11/17. PT denies any vaginal discharge.

## 2016-11-04 NOTE — ED Provider Notes (Signed)
[facility]-EMERGENCY DEPT Provider Note   CSN: [id number] Arrival date & time: 11/02/16  4010     History   Chief Complaint Chief Complaint  Patient presents with  . Emesis During Pregnancy    HPI Roberta Baker is a 31 y.o. female.  HPI  Roberta Baker is a 31 y.o. female G37P0Ab0, [redacted] week gestation, who presents to the Emergency Department complaining of persistent nausea and vomiting.  Recently seen at [name] for abdominal pain and vomiting and given reglan which she ran out of.  No other prenatal care.  Given referral for [facility] but doesn't have appt yet.  She states the reglan was helping, but ran out one day prior and since then has been unable to keep down fluids or food.  She denies increased abdominal pain, vaginal bleeding, diarrhea, fever and dysuria.    Past Medical History:  Diagnosis Date  . Seizures (HCC)     There are no active problems to display for this patient.   Past Surgical History:  Procedure Laterality Date  . BREAST ENHANCEMENT SURGERY      OB History    Gravida Para Term Preterm AB Living   1       0 0   SAB TAB Ectopic Multiple Live Births                   Home Medications    Prior to Admission medications   Medication Sig Start Date End Date Taking? Authorizing Provider  ibuprofen (ADVIL,MOTRIN) 200 MG tablet Take 400 mg by mouth every 6 (six) hours as needed for headache.   Yes Historical Provider, MD  metoCLOPramide (REGLAN) 10 MG tablet Take 1 tablet (10 mg total) by mouth every 6 (six) hours. 10/22/16  Yes Harolyn Rutherford Rasch, NP  ondansetron (ZOFRAN) 4 MG tablet Take 1 tablet (4 mg total) by mouth every 6 (six) hours. 11/02/16   Jasha Hodzic, PA-C  potassium chloride SA (K-DUR,KLOR-CON) 20 MEQ tablet Take 1 tablet (20 mEq total) by mouth 2 (two) times daily. 11/02/16   Doshia Dalia, PA-C    Family History History reviewed. No pertinent family history.  Social History Social History  Substance Use Topics    . Smoking status: Former Smoker    Packs/day: 0.25    Years: 0.50    Types: Cigarettes    Quit date: 06/21/2016  . Smokeless tobacco: Never Used  . Alcohol use No     Allergies   Sulfa antibiotics   Review of Systems Review of Systems  Constitutional: Negative for activity change, appetite change and fever.  HENT: Negative for sore throat.   Respiratory: Negative for chest tightness and shortness of breath.   Cardiovascular: Negative for chest pain.  Gastrointestinal: Positive for abdominal pain, nausea and vomiting. Negative for diarrhea.  Genitourinary: Negative for dysuria, frequency, hematuria, urgency, vaginal bleeding, vaginal discharge and vaginal pain.  Musculoskeletal: Negative for arthralgias and myalgias.  Skin: Negative for rash.  Neurological: Negative for dizziness, weakness and numbness.     Physical Exam Updated Vital Signs BP 117/63   Pulse 97   Temp 98.4 F (36.9 C) (Oral)   Resp 18   Ht 5' (1.524 m)   Wt 50.3 kg   LMP 07/05/2016   SpO2 100%   BMI 21.68 kg/m   Physical Exam  Constitutional: She is oriented to person, place, and time. She appears well-developed and well-nourished. No distress.  HENT:  Mouth/Throat: Oropharynx is clear and moist.  Cardiovascular: Normal rate, regular rhythm and intact distal pulses.   Abdominal: Soft. She exhibits no distension and no mass. There is no tenderness. There is no guarding.  Musculoskeletal: Normal range of motion.  Neurological: She is alert and oriented to person, place, and time.  Skin: Skin is warm. No rash noted.  Psychiatric: She has a normal mood and affect.  Nursing note and vitals reviewed.    ED Treatments / Results  Labs (all labs ordered are listed, but only abnormal results are displayed) Labs Reviewed  COMPREHENSIVE METABOLIC PANEL - Abnormal; Notable for the following:       Result Value   Potassium 2.8 (*)    BUN <5 (*)    ALT 10 (*)    Alkaline Phosphatase 37 (*)    All  other components within normal limits  CBC WITH DIFFERENTIAL/PLATELET - Abnormal; Notable for the following:    Neutro Abs 8.9 (*)    All other components within normal limits  URINALYSIS, ROUTINE W REFLEX MICROSCOPIC - Abnormal; Notable for the following:    Hgb urine dipstick TRACE (*)    Ketones, ur 40 (*)    Protein, ur TRACE (*)    All other components within normal limits  HCG, QUANTITATIVE, PREGNANCY - Abnormal; Notable for the following:    hCG, Beta Chain, Quant, S 41,492 (*)    All other components within normal limits  URINALYSIS, MICROSCOPIC (REFLEX) - Abnormal; Notable for the following:    Bacteria, UA RARE (*)    Squamous Epithelial / LPF 0-5 (*)    All other components within normal limits  LIPASE, BLOOD  ABO/RH    EKG  EKG Interpretation None       Radiology No results found.  Procedures Procedures (including critical care time)  Medications Ordered in ED Medications  sodium chloride 0.9 % bolus 1,000 mL (0 mLs Intravenous Stopped 11/02/16 1451)  ondansetron (ZOFRAN) injection 4 mg (4 mg Intravenous Given 11/02/16 0915)  potassium chloride 10 mEq in 100 mL IVPB (0 mEq Intravenous Stopped 11/02/16 1451)  magnesium sulfate IVPB 1 g 100 mL (0 g Intravenous Stopped 11/02/16 1344)  potassium chloride (KLOR-CON) packet 40 mEq (40 mEq Oral Given 11/02/16 1252)     Initial Impression / Assessment and Plan / ED Course  I have reviewed the triage vital signs and the nursing notes.  Pertinent labs & imaging results that were available during my care of the patient were reviewed by me and considered in my medical decision making (see chart for details).     Pt with persistent vomiting during second trimester.  On exam, abd is soft, NT.  Pt feeling better after IVF's and IV Zofran.  Tolerating po fluids.  Hypokalemia likely secondary to vomiting.  Treated with IV and po potassium.  Rx for potassium and zofran.  Pt appears stable for d/c.  [facility]  Return precautions discussed.    Final Clinical Impressions(s) / ED Diagnoses   Final diagnoses:  Hyperemesis arising during pregnancy  Hypokalemia    New Prescriptions Discharge Medication List as of 11/02/2016  1:55 PM    START taking these medications   Details  ondansetron (ZOFRAN) 4 MG tablet Take 1 tablet (4 mg total) by mouth every 6 (six) hours., Starting Fri 11/02/2016, Print    potassium chloride SA (K-DUR,KLOR-CON) 20 MEQ tablet Take 1 tablet (20 mEq total) by mouth 2 (two) times daily., Starting Fri 11/02/2016, Print  Pauline Aus, PA-C 11/04/16 2227    Eber Hong, MD 11/06/16 [phone]

## 2016-11-22 ENCOUNTER — Other Ambulatory Visit: Payer: Self-pay | Admitting: Obstetrics and Gynecology

## 2016-11-22 DIAGNOSIS — O3680X0 Pregnancy with inconclusive fetal viability, not applicable or unspecified: Secondary | ICD-10-CM

## 2016-11-23 ENCOUNTER — Other Ambulatory Visit: Payer: Self-pay | Admitting: Obstetrics and Gynecology

## 2016-11-23 ENCOUNTER — Ambulatory Visit (INDEPENDENT_AMBULATORY_CARE_PROVIDER_SITE_OTHER): Payer: Medicaid Other

## 2016-11-23 ENCOUNTER — Encounter (INDEPENDENT_AMBULATORY_CARE_PROVIDER_SITE_OTHER): Payer: Self-pay

## 2016-11-23 DIAGNOSIS — O3680X0 Pregnancy with inconclusive fetal viability, not applicable or unspecified: Secondary | ICD-10-CM

## 2016-11-23 NOTE — Progress Notes (Signed)
US 18+3 wks,cephalic,cx 3.4 cm,normal left ovary,right ovary not visualized,svp of fluid 3.9 cm,fhr 143 bpm,ant pl gr 0,efw 241 g,anatomy complete,no obvious abnormalities seen,EDD 04/23/2017

## 2016-11-28 ENCOUNTER — Emergency Department (HOSPITAL_COMMUNITY)
Admission: EM | Admit: 2016-11-28 | Discharge: 2016-11-28 | Disposition: A | Discharge status: Home / Self Care | Payer: Medicaid Other | Attending: Emergency Medicine | Admitting: Emergency Medicine

## 2016-11-28 ENCOUNTER — Encounter (HOSPITAL_COMMUNITY): Payer: Self-pay | Admitting: Emergency Medicine

## 2016-11-28 DIAGNOSIS — Z3A19 19 weeks gestation of pregnancy: Secondary | ICD-10-CM | POA: Diagnosis not present

## 2016-11-28 DIAGNOSIS — O99612 Diseases of the digestive system complicating pregnancy, second trimester: Secondary | ICD-10-CM | POA: Diagnosis not present

## 2016-11-28 DIAGNOSIS — K0889 Other specified disorders of teeth and supporting structures: Secondary | ICD-10-CM | POA: Diagnosis not present

## 2016-11-28 DIAGNOSIS — Z87891 Personal history of nicotine dependence: Secondary | ICD-10-CM | POA: Diagnosis not present

## 2016-11-28 MED ORDER — AMOXICILLIN 500 MG PO CAPS: 500.0000 mg | ORAL_CAPSULE | Freq: Three times a day (TID) | ORAL | 0 refills | Status: DC

## 2016-11-28 MED ORDER — ONDANSETRON HCL 4 MG PO TABS: 4.0000 mg | ORAL_TABLET | Freq: Four times a day (QID) | ORAL | 1 refills | Status: DC

## 2016-11-28 MED ORDER — ACETAMINOPHEN 325 MG PO TABS
650.0000 mg | ORAL_TABLET | Freq: Once | ORAL | Status: AC
  Administered 2016-11-28: 650 mg via ORAL

## 2016-11-28 MED ORDER — ONDANSETRON 8 MG PO TBDP
8.0000 mg | ORAL_TABLET | Freq: Once | ORAL | Status: AC
  Administered 2016-11-28: 8 mg via ORAL
  Filled 2016-11-28: qty 1

## 2016-11-28 MED ORDER — AMOXICILLIN 250 MG PO CAPS
500.0000 mg | ORAL_CAPSULE | Freq: Once | ORAL | Status: AC
  Administered 2016-11-28: 500 mg via ORAL
  Filled 2016-11-28: qty 2

## 2016-11-28 MED ORDER — ACETAMINOPHEN 325 MG PO TABS
ORAL_TABLET | ORAL | Status: AC
  Filled 2016-11-28: qty 2

## 2016-11-28 NOTE — ED Notes (Signed)
Pt ambulatory to waiting room. Pt verbalized understanding of discharge instructions.   

## 2016-11-28 NOTE — ED Provider Notes (Signed)
[facility]-EMER[facility]Y DEPT Provider Note   CSN: [id number] Arrival date & time: 11/28/16  0147     History   Chief Complaint Chief Complaint  Patient presents with  . Dental Pain    HPI Roberta Baker is a 31 y.o. female.  Patient presents with complaints of toothache. She reports that she started having pain and feeling like left side of her face was on 45 hours ago. She has had multiple dental infections in the past, this feels similar.  Patient is currently pregnant. She has not been experiencing any abdominal or pelvic pain, vaginal bleeding or discharge. She does, however, report nausea. This has been a problem throughout her pregnancy. She has run out of her Zofran.      Past Medical History:  Diagnosis Date  . Seizures (HCC)     There are no active problems to display for this patient.   Past Surgical History:  Procedure Laterality Date  . BREAST ENHANCEMENT SURGERY      OB History    Gravida Para Term Preterm AB Living   1       0 0   SAB TAB Ectopic Multiple Live Births                   Home Medications    Prior to Admission medications   Medication Sig Start Date End Date Taking? Authorizing Provider  amoxicillin (AMOXIL) 500 MG capsule Take 1 capsule (500 mg total) by mouth 3 (three) times daily. 11/28/16   Gilda Crease, MD  ibuprofen (ADVIL,MOTRIN) 200 MG tablet Take 400 mg by mouth every 6 (six) hours as needed for headache.    Historical Provider, MD  metoCLOPramide (REGLAN) 10 MG tablet Take 1 tablet (10 mg total) by mouth every 6 (six) hours. 10/22/16   Harolyn Rutherford Rasch, NP  ondansetron (ZOFRAN) 4 MG tablet Take 1 tablet (4 mg total) by mouth every 6 (six) hours. 11/28/16   Gilda Crease, MD  potassium chloride SA (K-DUR,KLOR-CON) 20 MEQ tablet Take 1 tablet (20 mEq total) by mouth 2 (two) times daily. 11/02/16   Tammy Triplett, PA-C    Family History History reviewed. No pertinent family history.  Social History Social  History  Substance Use Topics  . Smoking status: Former Smoker    Packs/day: 0.25    Years: 0.50    Types: Cigarettes    Quit date: 06/21/2016  . Smokeless tobacco: Never Used  . Alcohol use No     Allergies   Sulfa antibiotics   Review of Systems Review of Systems  HENT: Positive for dental problem.   Gastrointestinal: Positive for nausea.  All other systems reviewed and are negative.    Physical Exam Updated Vital Signs BP (!) 98/58 (BP Location: Left Arm)   Pulse (!) 104   Temp 97.9 F (36.6 C) (Oral)   Resp 12   Ht 5' (1.524 m)   Wt 111 lb (50.3 kg)   LMP 07/05/2016   SpO2 100%   BMI 21.68 kg/m   Physical Exam  Constitutional: She is oriented to person, place, and time. She appears well-developed and well-nourished. No distress.  HENT:  Head: Normocephalic and atraumatic.  Right Ear: Hearing normal.  Left Ear: Hearing normal.  Nose: Nose normal.  Mouth/Throat: Oropharynx is clear and moist and mucous membranes are normal.    As to percussion right upper molars without overt abscess formation or obvious caries  Eyes: Conjunctivae and EOM are normal. Pupils are equal,  round, and reactive to light.  Neck: Normal range of motion. Neck supple.  Cardiovascular: Regular rhythm, S1 normal and S2 normal.  Exam reveals no gallop and no friction rub.   No murmur heard. Pulmonary/Chest: Effort normal and breath sounds normal. No respiratory distress. She exhibits no tenderness.  Abdominal: Soft. Normal appearance and bowel sounds are normal. There is no hepatosplenomegaly. There is no tenderness. There is no rebound, no guarding, no tenderness at McBurney's point and negative Murphy's sign. No hernia.  Musculoskeletal: Normal range of motion.  Neurological: She is alert and oriented to person, place, and time. She has normal strength. No cranial nerve deficit or sensory deficit. Coordination normal. GCS eye subscore is 4. GCS verbal subscore is 5. GCS motor subscore is  6.  Skin: Skin is warm, dry and intact. No rash noted. No cyanosis.  Psychiatric: She has a normal mood and affect. Her speech is normal and behavior is normal. Thought content normal.  Nursing note and vitals reviewed.    ED Treatments / Results  Labs (all labs ordered are listed, but only abnormal results are displayed) Labs Reviewed - No data to display  EKG  EKG Interpretation None       Radiology No results found.  Procedures Procedures (including critical care time)  Medications Ordered in ED Medications - No data to display   Initial Impression / Assessment and Plan / ED Course  I have reviewed the triage vital signs and the nursing notes.  Pertinent labs & imaging results that were available during my care of the patient were reviewed by me and considered in my medical decision making (see chart for details).     With pain of the right upper molars that is similar to dental infections she has had previously. Will initiate amoxicillin empirically. Tylenol for pain. Patient also complaining of nausea. She has been using Zofran by her to this successfully but has run out. Was given Zofran here in the ER and a prescription. She does have follow-up scheduled with her OB/GYN.  Final Clinical Impressions(s) / ED Diagnoses   Final diagnoses:  Pain, dental    New Prescriptions New Prescriptions   AMOXICILLIN (AMOXIL) 500 MG CAPSULE    Take 1 capsule (500 mg total) by mouth 3 (three) times daily.   ONDANSETRON (ZOFRAN) 4 MG TABLET    Take 1 tablet (4 mg total) by mouth every 6 (six) hours.     Gilda Creasehristopher J Gennie Eisinger, MD 11/28/16 [phone]

## 2016-11-28 NOTE — ED Triage Notes (Signed)
Pt c/o left sided dental pain

## 2016-12-12 ENCOUNTER — Ambulatory Visit (INDEPENDENT_AMBULATORY_CARE_PROVIDER_SITE_OTHER): Payer: Medicaid Other | Admitting: Advanced Practice Midwife

## 2016-12-12 ENCOUNTER — Ambulatory Visit: Payer: Medicaid Other | Admitting: *Deleted

## 2016-12-12 ENCOUNTER — Encounter: Payer: Self-pay | Admitting: Advanced Practice Midwife

## 2016-12-12 ENCOUNTER — Other Ambulatory Visit (HOSPITAL_COMMUNITY)
Admission: RE | Admit: 2016-12-12 | Discharge: 2016-12-12 | Disposition: A | Discharge status: Home / Self Care | Payer: Medicaid Other | Source: Ambulatory Visit | Attending: Advanced Practice Midwife | Admitting: Advanced Practice Midwife

## 2016-12-12 VITALS — BP 90/58 | HR 78 | Wt 112.0 lb

## 2016-12-12 DIAGNOSIS — O0932 Supervision of pregnancy with insufficient antenatal care, second trimester: Secondary | ICD-10-CM | POA: Insufficient documentation

## 2016-12-12 DIAGNOSIS — Z3402 Encounter for supervision of normal first pregnancy, second trimester: Secondary | ICD-10-CM | POA: Insufficient documentation

## 2016-12-12 DIAGNOSIS — Z331 Pregnant state, incidental: Secondary | ICD-10-CM

## 2016-12-12 DIAGNOSIS — Z124 Encounter for screening for malignant neoplasm of cervix: Secondary | ICD-10-CM | POA: Diagnosis not present

## 2016-12-12 DIAGNOSIS — Z1389 Encounter for screening for other disorder: Secondary | ICD-10-CM

## 2016-12-12 DIAGNOSIS — Z3A21 21 weeks gestation of pregnancy: Secondary | ICD-10-CM | POA: Insufficient documentation

## 2016-12-12 DIAGNOSIS — B009 Herpesviral infection, unspecified: Secondary | ICD-10-CM | POA: Diagnosis not present

## 2016-12-12 DIAGNOSIS — O0990 Supervision of high risk pregnancy, unspecified, unspecified trimester: Secondary | ICD-10-CM | POA: Insufficient documentation

## 2016-12-12 DIAGNOSIS — F418 Other specified anxiety disorders: Secondary | ICD-10-CM | POA: Insufficient documentation

## 2016-12-12 MED ORDER — DOXYLAMINE-PYRIDOXINE 10-10 MG PO TBEC: DELAYED_RELEASE_TABLET | ORAL | 3 refills | Status: DC

## 2016-12-12 MED ORDER — PNV PRENATAL PLUS MULTIVITAMIN 27-1 MG PO TABS: 1.0000 | ORAL_TABLET | Freq: Every day | ORAL | 11 refills | Status: DC

## 2016-12-12 NOTE — Progress Notes (Signed)
  Subjective:    Roberta Baker is a G1P0000 [redacted]w[redacted]d being seen today for her first obstetrical visit.  Her obstetrical history is significant for first pregnancy.  Pregnancy history fully reviewed. When she found out she was pregnant, she abruptly stopped Klonopin (she had used for 10 years) and had a seizure.  She then self-weaned off w/o any more problems.  Does not want to take anxiety medicine again (not even SSRI or Buspar).  Offered referral for CBT (or similar); accepted.    Patient reports nausea. Whereas she has to take zofran BID  Vitals:   12/12/16 1012  BP: (!) 90/58  Pulse: 78  Weight: 112 lb (50.8 kg)    HISTORY: OB History  Gravida Para Term Preterm AB Living  1       0 0  SAB TAB Ectopic Multiple Live Births               # Outcome Date GA Lbr Len/2nd Weight Sex Delivery Anes PTL Lv  1 Current              Past Medical History:  Diagnosis Date  . Kidney stones   . 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 BREAST ENHANCEMENT SURGERY     Family History  Problem Relation Age of Onset  . Mental illness Mother   . Cancer Maternal Uncle   . Cancer Maternal Grandmother     breast cancer  . Heart attack Maternal Grandfather      Exam       Pelvic Exam:    Perineum: Normal Perineum   Vulva: normal   Vagina:  normal mucosa, normal discharge, no palpable nodules   Uterus Normal, Gravid, FH: 21     Cervix: normal   Adnexa: Not palpable   Urinary:  urethral meatus normal    System:     Skin: normal coloration and turgor, no rashes    Neurologic: oriented, normal, normal mood   Extremities: normal strength, tone, and muscle mass   HEENT PERRLA   Mouth/Teeth mucous membranes moist, normal dentition   Neck supple and no masses   Cardiovascular: regular rate and rhythm   Respiratory:  appears well, vitals normal, no respiratory distress, acyanotic   Abdomen: soft, non-tender;  FHR: 150           Assessment:    Pregnancy: G1P0000 Patient Active Problem List   Diagnosis Date Noted  . Supervision of normal first pregnancy 12/12/2016  . Late prenatal care in second trimester 12/12/2016  . 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 Anxiety disorder 12/12/2016        Plan:     Initial labs drawn. Pap smear collected Continue prenatal vitamins  Problem list reviewed and updated  Rx for diclegis (prior approval done) d/t still having to take zofran BID Reviewed recommended weight gain based on pre-gravid BMI  Encouraged well-balanced diet Genetic Screening discussed too late: Marland Kitchen  Ultrasound discussed; fetal survey: results reviewed. Referral to [facility] sent  Return in about 3 weeks (around 01/02/2017) for LROB.  CRESENZO-DISHMAN,Tajuan Dufault 12/12/2016

## 2016-12-13 LAB — HEPATITIS B SURFACE ANTIGEN: Hepatitis B Surface Ag: NEGATIVE

## 2016-12-13 LAB — RUBELLA SCREEN: RUBELLA: 1.43 {index} (ref >0.99)

## 2016-12-13 LAB — RPR: RPR: NONREACTIVE

## 2016-12-13 LAB — VARICELLA ZOSTER ANTIBODY, IGG: Varicella zoster IgG: 821 index (ref >165)

## 2016-12-14 LAB — URINE CULTURE

## 2016-12-17 LAB — CYTOLOGY - PAP
Chlamydia: NEGATIVE
Diagnosis: NEGATIVE
HPV: NOT DETECTED
NEISSERIA GONORRHEA: NEGATIVE

## 2016-12-18 ENCOUNTER — Telehealth: Payer: Self-pay | Admitting: *Deleted

## 2016-12-18 ENCOUNTER — Other Ambulatory Visit: Payer: Self-pay | Admitting: Advanced Practice Midwife

## 2016-12-18 MED ORDER — NITROFURANTOIN MONOHYD MACRO 100 MG PO CAPS: 100.0000 mg | ORAL_CAPSULE | Freq: Two times a day (BID) | ORAL | 0 refills | Status: DC

## 2016-12-18 NOTE — Telephone Encounter (Signed)
Informed of prescription sent for macrobid to pharmacy for UTI. Pt verbalized understanding.

## 2016-12-18 NOTE — Progress Notes (Unsigned)
macrobid for UTI

## 2016-12-21 ENCOUNTER — Telehealth: Payer: Self-pay | Admitting: Obstetrics & Gynecology

## 2016-12-21 MED ORDER — ONDANSETRON HCL 4 MG PO TABS: 4.0000 mg | ORAL_TABLET | Freq: Three times a day (TID) | ORAL | 0 refills | Status: DC | PRN

## 2016-12-21 NOTE — Telephone Encounter (Signed)
Pt called stating that she was given a medication and she would like to know if it is save for her to take the medication while preg. Please contact pt

## 2016-12-21 NOTE — Telephone Encounter (Signed)
Informed prescription was sent to pharmacy for zofran.

## 2016-12-21 NOTE — Telephone Encounter (Signed)
Patient called stating the diclegis is not helping her nausea. She would like to take the zofran again if possible. Please advise.

## 2017-01-04 ENCOUNTER — Encounter: Payer: Medicaid Other | Admitting: Women's Health

## 2017-01-07 ENCOUNTER — Ambulatory Visit (INDEPENDENT_AMBULATORY_CARE_PROVIDER_SITE_OTHER): Payer: Medicaid Other | Admitting: Obstetrics & Gynecology

## 2017-01-07 ENCOUNTER — Encounter: Payer: Self-pay | Admitting: Obstetrics & Gynecology

## 2017-01-07 DIAGNOSIS — Z3482 Encounter for supervision of other normal pregnancy, second trimester: Secondary | ICD-10-CM

## 2017-01-07 DIAGNOSIS — Z331 Pregnant state, incidental: Secondary | ICD-10-CM | POA: Diagnosis not present

## 2017-01-07 DIAGNOSIS — Z1389 Encounter for screening for other disorder: Secondary | ICD-10-CM

## 2017-01-07 LAB — POCT URINALYSIS DIPSTICK
Glucose, UA: NEGATIVE
Glucose, UA: NEGATIVE
Ketones, UA: NEGATIVE
NITRITE UA: NEGATIVE
PROTEIN UA: NEGATIVE

## 2017-01-07 MED ORDER — PNV PRENATAL PLUS MULTIVITAMIN 27-1 MG PO TABS: 1.0000 | ORAL_TABLET | Freq: Every day | ORAL | 11 refills | Status: DC

## 2017-01-07 MED ORDER — ONDANSETRON HCL 4 MG PO TABS: 4.0000 mg | ORAL_TABLET | Freq: Three times a day (TID) | ORAL | 0 refills | Status: DC | PRN

## 2017-01-07 NOTE — Progress Notes (Signed)
G1P0000 5318w6d Estimated Date of Delivery: 04/23/17  Blood pressure (!) 80/60, pulse 96, weight 122 lb 8 oz (55.6 kg), last menstrual period 07/05/2016.   BP weight and urine results all reviewed and noted.  Please refer to the obstetrical flow sheet for the fundal height and fetal heart rate documentation:  Patient reports good fetal movement, denies any bleeding and no rupture of membranes symptoms or regular contractions. Patient is without complaints. All questions were answered.  Orders Placed This Encounter  Procedures  . POCT Urinalysis Dipstick    Plan:  Continued routine obstetrical care, PN2 next visit  Return in about 4 weeks (around 02/04/2017) for PN2, , LROB.

## 2017-02-04 ENCOUNTER — Encounter: Payer: Medicaid Other | Admitting: Women's Health

## 2017-02-04 ENCOUNTER — Other Ambulatory Visit: Payer: Medicaid Other

## 2017-02-06 ENCOUNTER — Telehealth: Payer: Self-pay | Admitting: *Deleted

## 2017-02-06 NOTE — Telephone Encounter (Signed)
Pt called stating that she thinks she has an infected tooth and wanted to see if we would prescribe her an antibiotic. I advised pt that she would need to be seen by a provider esp since she missed her last appt. Pt verbalized understanding.

## 2017-02-07 ENCOUNTER — Encounter (HOSPITAL_COMMUNITY): Payer: Self-pay | Admitting: *Deleted

## 2017-02-07 ENCOUNTER — Telehealth: Payer: Self-pay | Admitting: *Deleted

## 2017-02-07 ENCOUNTER — Inpatient Hospital Stay (HOSPITAL_COMMUNITY)
Admission: AD | Admit: 2017-02-07 | Discharge: 2017-02-08 | Disposition: A | Discharge status: Home / Self Care | Payer: Medicaid Other | Source: Ambulatory Visit | Attending: Family Medicine | Admitting: Family Medicine

## 2017-02-07 DIAGNOSIS — Z3A29 29 weeks gestation of pregnancy: Secondary | ICD-10-CM | POA: Insufficient documentation

## 2017-02-07 DIAGNOSIS — O26893 Other specified pregnancy related conditions, third trimester: Secondary | ICD-10-CM | POA: Insufficient documentation

## 2017-02-07 DIAGNOSIS — O219 Vomiting of pregnancy, unspecified: Secondary | ICD-10-CM

## 2017-02-07 DIAGNOSIS — O9989 Other specified diseases and conditions complicating pregnancy, childbirth and the puerperium: Secondary | ICD-10-CM | POA: Diagnosis not present

## 2017-02-07 DIAGNOSIS — Z87891 Personal history of nicotine dependence: Secondary | ICD-10-CM | POA: Insufficient documentation

## 2017-02-07 DIAGNOSIS — K0889 Other specified disorders of teeth and supporting structures: Secondary | ICD-10-CM | POA: Insufficient documentation

## 2017-02-07 DIAGNOSIS — B009 Herpesviral infection, unspecified: Secondary | ICD-10-CM

## 2017-02-07 DIAGNOSIS — O0932 Supervision of pregnancy with insufficient antenatal care, second trimester: Secondary | ICD-10-CM

## 2017-02-07 DIAGNOSIS — O212 Late vomiting of pregnancy: Secondary | ICD-10-CM | POA: Insufficient documentation

## 2017-02-07 DIAGNOSIS — Z3402 Encounter for supervision of normal first pregnancy, second trimester: Secondary | ICD-10-CM

## 2017-02-07 DIAGNOSIS — Z87442 Personal history of urinary calculi: Secondary | ICD-10-CM | POA: Insufficient documentation

## 2017-02-07 HISTORY — DX: Vomiting of pregnancy, unspecified: O21.9

## 2017-02-07 HISTORY — DX: Other specified disorders of teeth and supporting structures: K08.89

## 2017-02-07 MED ORDER — OXYCODONE-ACETAMINOPHEN 5-325 MG PO TABS
1.0000 | ORAL_TABLET | ORAL | Status: DC | PRN
  Administered 2017-02-07: 1 via ORAL
  Filled 2017-02-07: qty 1

## 2017-02-07 MED ORDER — AMOXICILLIN-POT CLAVULANATE 875-125 MG PO TABS: 1.0000 | ORAL_TABLET | Freq: Two times a day (BID) | ORAL | 0 refills | Status: AC

## 2017-02-07 MED ORDER — OXYCODONE-ACETAMINOPHEN 5-325 MG PO TABS: 1.0000 | ORAL_TABLET | ORAL | 0 refills | Status: DC | PRN

## 2017-02-07 MED ORDER — ONDANSETRON 8 MG PO TBDP
8.0000 mg | ORAL_TABLET | Freq: Three times a day (TID) | ORAL | 0 refills | Status: DC | PRN
Start: 2017-02-07 — End: 2017-03-05

## 2017-02-07 NOTE — MAU Provider Note (Signed)
History     CSN: [id number]  Arrival date and time: 02/07/17 2101   First Provider Initiated Contact with Patient 02/07/17 2344      Chief Complaint  Patient presents with  . Dental Pain   HPI  [name] is a 31 yo G1P0 at 29.[redacted] wks gestation presenting with complaints of toothache of back RT upper teeth.  She has had work done on these teeth before. She called the dentist that did the work on Tuesday, but has not received a call back from him.  She called [facility] yesterday and was called back today.  She was told by FT to come to MAU for evaluation. She is requesting a Rx for abx, nausea and pain. She also complains of a pulling sensation down the middle of her abdomen. Pain rated 4/10.  Past Medical History:  Diagnosis Date  . Kidney stones   . Nausea and vomiting in pregnancy 02/07/2017  . Pain, dental 02/07/2017  . 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 BREAST ENHANCEMENT SURGERY      Family History  Problem Relation Age of Onset  . Mental illness Mother   . Cancer Maternal Uncle   . Cancer Maternal Grandmother        breast cancer  . Heart attack Maternal Grandfather     Social History  Substance Use Topics  . Smoking status: Former Smoker    Packs/day: 0.25    Years: 0.50    Types: Cigarettes    Quit date: 06/21/2016  . Smokeless tobacco: Never Used  . Alcohol use No    Allergies:  Allergies  Allergen Reactions  . Sulfa Antibiotics Nausea And Vomiting    Prescriptions Prior to Admission  Medication Sig Dispense Refill Last Dose  . acetaminophen (TYLENOL) 500 MG tablet Take 500 mg by mouth as needed.   Taking  . Doxylamine-Pyridoxine 10-10 MG TBEC 2 PO qhs; may take 1po in am and 1po in afternoon prn nausea 120 tablet 3 Taking  . ondansetron (ZOFRAN) 4 MG tablet Take 1 tablet (4 mg total) by mouth every 8 (eight) hours as needed for nausea or vomiting. 20 tablet 0   . Prenatal  Vit-Fe Fumarate-FA (PNV PRENATAL PLUS MULTIVITAMIN) 27-1 MG TABS Take 1 tablet by mouth daily. 30 tablet 11     Review of Systems  Constitutional: Negative.   HENT:       Tooth pain (upper RT back teeth)  Eyes: Negative.   Respiratory: Negative.   Cardiovascular: Negative.   Gastrointestinal: Positive for abdominal pain (midline "pulling" feeling) and nausea.  Endocrine: Negative.   Genitourinary: Negative.   Musculoskeletal: Negative.   Skin: Negative.   Allergic/Immunologic: Negative.   Neurological: Negative.   Hematological: Negative.   Psychiatric/Behavioral: Negative.    Physical Exam   Blood pressure (!) 103/59, pulse (!) 101, temperature 98.4 F (36.9 C), temperature source Oral, resp. rate 20, height 5' (1.524 m), weight 60.1 kg (132 lb 8 oz), last menstrual period 07/05/2016.  Physical Exam  Constitutional: She is oriented to person, place, and time. She appears well-developed and well-nourished.  HENT:  Mouth/Throat:    Genitourinary:  Genitourinary Comments: Uterus: gravid, S=D, cx; closed/long/firm, no CMT or friability, no adnexal tenderness    Neurological: She is alert and oriented to person, place, and time. She has normal reflexes.  Skin: Skin is warm and dry.  Psychiatric: She has a  normal mood and affect. Her behavior is normal. Judgment and thought content normal.   CEFM  FHR: 145 bpm / moderate variability / accels present / decels absent TOCO: none  MAU Course  Procedures  MDM Percocet 5/325 mg 1 tablet po now - pain relieved  Assessment and Plan  Pain, dental - Rx Augmentin 875 mg po BID x 10 days - Percocet 5/325 mg po 1 every 4 hrs prn pain - Zofran 8 mg ODT 1 every 8 hrs prn nausea (pt Rx ran out) - F/U with dentist -- dental letter given  Discharge home Call to re-schedule OB visit with FT Patient verbalized an understanding of the plan of care and agrees.   Raelyn Mora MSN, CNM 02/07/2017, 11:57 PM

## 2017-02-07 NOTE — Telephone Encounter (Signed)
LMOVM that if she may need to see a dentist and I could fax release form for her to have any procedure or medication to dental office but needed name. Advised to call back if further questions.

## 2017-02-07 NOTE — MAU Note (Signed)
PT  SAYS  SHE HAS TOOTHACHE - TOP RIGHT -   CALLED  DENTIST ON Tuesday- HAS NOT CALLED  BACK.        SHE CALLED [facility] YESTERDAY - THEY CALLED  HERE BACK TODAY -TOLD  TO COME HERE.

## 2017-02-08 DIAGNOSIS — K0889 Other specified disorders of teeth and supporting structures: Secondary | ICD-10-CM

## 2017-02-08 DIAGNOSIS — O9989 Other specified diseases and conditions complicating pregnancy, childbirth and the puerperium: Secondary | ICD-10-CM | POA: Diagnosis not present

## 2017-03-05 ENCOUNTER — Inpatient Hospital Stay (HOSPITAL_COMMUNITY)
Admission: AD | Admit: 2017-03-05 | Discharge: 2017-03-05 | Disposition: A | Discharge status: Home / Self Care | Payer: Medicaid Other | Source: Ambulatory Visit | Attending: Family Medicine | Admitting: Family Medicine

## 2017-03-05 ENCOUNTER — Encounter (HOSPITAL_COMMUNITY): Payer: Self-pay | Admitting: *Deleted

## 2017-03-05 DIAGNOSIS — O212 Late vomiting of pregnancy: Secondary | ICD-10-CM | POA: Diagnosis not present

## 2017-03-05 DIAGNOSIS — N760 Acute vaginitis: Secondary | ICD-10-CM

## 2017-03-05 DIAGNOSIS — O23593 Infection of other part of genital tract in pregnancy, third trimester: Secondary | ICD-10-CM | POA: Insufficient documentation

## 2017-03-05 DIAGNOSIS — O4703 False labor before 37 completed weeks of gestation, third trimester: Secondary | ICD-10-CM | POA: Diagnosis not present

## 2017-03-05 DIAGNOSIS — F17210 Nicotine dependence, cigarettes, uncomplicated: Secondary | ICD-10-CM | POA: Diagnosis not present

## 2017-03-05 DIAGNOSIS — B9689 Other specified bacterial agents as the cause of diseases classified elsewhere: Secondary | ICD-10-CM | POA: Diagnosis not present

## 2017-03-05 DIAGNOSIS — O26893 Other specified pregnancy related conditions, third trimester: Secondary | ICD-10-CM

## 2017-03-05 DIAGNOSIS — Z3A33 33 weeks gestation of pregnancy: Secondary | ICD-10-CM | POA: Diagnosis not present

## 2017-03-05 DIAGNOSIS — N898 Other specified noninflammatory disorders of vagina: Secondary | ICD-10-CM

## 2017-03-05 DIAGNOSIS — O99333 Smoking (tobacco) complicating pregnancy, third trimester: Secondary | ICD-10-CM | POA: Diagnosis not present

## 2017-03-05 DIAGNOSIS — R109 Unspecified abdominal pain: Secondary | ICD-10-CM | POA: Diagnosis present

## 2017-03-05 DIAGNOSIS — O219 Vomiting of pregnancy, unspecified: Secondary | ICD-10-CM

## 2017-03-05 HISTORY — DX: Renal tubulo-interstitial disease, unspecified: N15.9

## 2017-03-05 HISTORY — DX: Anxiety disorder, unspecified: F41.9

## 2017-03-05 LAB — COMPREHENSIVE METABOLIC PANEL
ALT: 13 U/L — ABNORMAL LOW (ref 14–54)
AST: 16 U/L (ref 15–41)
Albumin: 3.2 g/dL — ABNORMAL LOW (ref 3.5–5.0)
Alkaline Phosphatase: 77 U/L (ref 38–126)
Anion gap: 8 (ref 5–15)
CHLORIDE: 106 mmol/L (ref 101–111)
CO2: 21 mmol/L — ABNORMAL LOW (ref 22–32)
Calcium: 8.5 mg/dL — ABNORMAL LOW (ref 8.9–10.3)
Creatinine, Ser: 0.37 mg/dL — ABNORMAL LOW (ref 0.44–1.00)
Glucose, Bld: 90 mg/dL (ref 65–99)
POTASSIUM: 3.2 mmol/L — AB (ref 3.5–5.1)
Sodium: 135 mmol/L (ref 135–145)
Total Bilirubin: 0.5 mg/dL (ref 0.3–1.2)
Total Protein: 6.6 g/dL (ref 6.5–8.1)

## 2017-03-05 LAB — CBC
HCT: 33.4 % — ABNORMAL LOW (ref 36.0–46.0)
Hemoglobin: 11.2 g/dL — ABNORMAL LOW (ref 12.0–15.0)
MCH: 30.4 pg (ref 26.0–34.0)
MCHC: 33.5 g/dL (ref 30.0–36.0)
MCV: 90.8 fL (ref 78.0–100.0)
PLATELETS: 311 10*3/uL (ref 150–400)
RBC: 3.68 MIL/uL — ABNORMAL LOW (ref 3.87–5.11)
RDW: 14.4 % (ref 11.5–15.5)
WBC: 15.6 10*3/uL — AB (ref 4.0–10.5)

## 2017-03-05 LAB — URINALYSIS, ROUTINE W REFLEX MICROSCOPIC
Bacteria, UA: NONE SEEN
Bilirubin Urine: NEGATIVE
Glucose, UA: NEGATIVE mg/dL
Hgb urine dipstick: NEGATIVE
KETONES UR: 20 mg/dL — AB
Leukocytes, UA: NEGATIVE
Nitrite: NEGATIVE
PH: 7 (ref 5.0–8.0)
Protein, ur: 30 mg/dL — AB
SPECIFIC GRAVITY, URINE: 1.016 (ref 1.005–1.030)

## 2017-03-05 LAB — WET PREP, GENITAL
Sperm: NONE SEEN
TRICH WET PREP: NONE SEEN
Yeast Wet Prep HPF POC: NONE SEEN

## 2017-03-05 LAB — FETAL FIBRONECTIN: FETAL FIBRONECTIN: NEGATIVE

## 2017-03-05 MED ORDER — FAMOTIDINE IN NACL 20-0.9 MG/50ML-% IV SOLN
20.0000 mg | Freq: Once | INTRAVENOUS | Status: AC
  Administered 2017-03-05: 20 mg via INTRAVENOUS
  Filled 2017-03-05: qty 50

## 2017-03-05 MED ORDER — ONDANSETRON 4 MG PO TBDP: 4.0000 mg | ORAL_TABLET | Freq: Three times a day (TID) | ORAL | 0 refills | Status: DC | PRN

## 2017-03-05 MED ORDER — METRONIDAZOLE 500 MG PO TABS: 500.0000 mg | ORAL_TABLET | Freq: Two times a day (BID) | ORAL | 0 refills | Status: DC

## 2017-03-05 MED ORDER — RANITIDINE HCL 150 MG PO TABS: 150.0000 mg | ORAL_TABLET | Freq: Two times a day (BID) | ORAL | 0 refills | Status: DC

## 2017-03-05 MED ORDER — DEXTROSE 5 % IN LACTATED RINGERS IV BOLUS
1000.0000 mL | Freq: Once | INTRAVENOUS | Status: AC
  Administered 2017-03-05: 1000 mL via INTRAVENOUS

## 2017-03-05 MED ORDER — ONDANSETRON HCL 4 MG/2ML IJ SOLN
4.0000 mg | Freq: Once | INTRAMUSCULAR | Status: AC
  Administered 2017-03-05: 4 mg via INTRAVENOUS
  Filled 2017-03-05: qty 2

## 2017-03-05 MED ORDER — PROMETHAZINE HCL 25 MG/ML IJ SOLN
12.5000 mg | Freq: Once | INTRAMUSCULAR | Status: AC
  Administered 2017-03-05: 12.5 mg via INTRAVENOUS
  Filled 2017-03-05: qty 1

## 2017-03-05 NOTE — MAU Provider Note (Signed)
History     CSN: [id number]  Arrival date and time: 03/05/17 1052  First Provider Initiated Contact with Patient 03/05/17 1130      Chief Complaint  Patient presents with  . Abdominal Pain  . Rupture of Membranes   HPI DESSIRAE SCAROLA is a 31 y.o. G1P0000 at [redacted]w[redacted]d who presents with abdomina pain/pressure & leaking of fluid. Reports intermittent pain & pressure for the last 2 days. Pain occurs 5-6 times per hour. Rates pain 5/10. Has not treated. Woke up this morning with wet underwear. Has not noticed leaking since then. Denies vaginal bleeding. Positive fetal movement. Last intercourse 3 days ago. Has missed OB appointments at [facility] due to transportation issues. Patient living with friends & states she has "no one" to help her. Declines meeting with CSW today b/c she states she's been contacting people for resources on her own.  OB History    Gravida Para Term Preterm AB Living   1       0 0   SAB TAB Ectopic Multiple Live Births                  Past Medical History:  Diagnosis Date  . Anxiety   . Kidney infection   . Kidney stones   . Nausea and vomiting in pregnancy 02/07/2017  . Pain, dental 02/07/2017  . 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 BREAST ENHANCEMENT SURGERY      Family History  Problem Relation Age of Onset  . Mental illness Mother   . Cancer Maternal Uncle   . Cancer Maternal Grandmother        breast cancer  . Heart attack Maternal Grandfather     Social History  Substance Use Topics  . Smoking status: Current Every Day Smoker    Packs/day: 0.25    Years: 0.50    Types: Cigarettes    Last attempt to quit: 06/21/2016  . Smokeless tobacco: Current User  . Alcohol use No    Allergies:  Allergies  Allergen Reactions  . Sulfa Antibiotics Nausea And Vomiting    Prescriptions Prior to Admission  Medication Sig Dispense Refill Last Dose  . acetaminophen (TYLENOL) 500 MG  tablet Take 500 mg by mouth every 6 (six) hours as needed for moderate pain.   Past Week at Unknown time  . Doxylamine-Pyridoxine 10-10 MG TBEC 2 PO qhs; may take 1po in am and 1po in afternoon prn nausea (Patient taking differently: Take 1-2 tablets by mouth 3 (three) times daily as needed (nausea). 2 PO qhs; may take 1po in am and 1po in afternoon prn nausea) 120 tablet 3 Past Month at Unknown time  . flintstones complete (FLINTSTONES) 60 MG chewable tablet Chew 1 tablet by mouth daily.   Past Week at Unknown time  . ondansetron (ZOFRAN ODT) 8 MG disintegrating tablet Take 1 tablet (8 mg total) by mouth every 8 (eight) hours as needed for nausea or vomiting. 20 tablet 0 Past Month at Unknown time  . oxyCODONE-acetaminophen (PERCOCET/ROXICET) 5-325 MG tablet Take 1 tablet by mouth every 4 (four) hours as needed for moderate pain or severe pain. 30 tablet 0 Past Month at Unknown time  . ondansetron (ZOFRAN) 4 MG tablet Take 1 tablet (4 mg total) by mouth every 8 (eight) hours as needed for nausea or vomiting. (Patient not taking: Reported on 03/05/2017) 20 tablet 0 Not Taking at Unknown  time  . Prenatal Vit-Fe Fumarate-FA (PNV PRENATAL PLUS MULTIVITAMIN) 27-1 MG TABS Take 1 tablet by mouth daily. (Patient not taking: Reported on 03/05/2017) 30 tablet 11 Not Taking at Unknown time    Review of Systems  Constitutional: Negative.   Gastrointestinal: Positive for abdominal pain and nausea. Negative for constipation and diarrhea.  Genitourinary: Positive for vaginal discharge. Negative for dysuria and vaginal bleeding.   Physical Exam   Blood pressure (!) 84/53, pulse 83, temperature 98.5 F (36.9 C), temperature source Oral, resp. rate 18, last menstrual period 07/05/2016.  Physical Exam  Nursing note and vitals reviewed. Constitutional: She is oriented to person, place, and time. She appears well-developed and well-nourished. No distress.  HENT:  Head: Normocephalic and atraumatic.  Eyes:  Conjunctivae are normal. Right eye exhibits no discharge. Left eye exhibits no discharge. No scleral icterus.  Neck: Normal range of motion.  Cardiovascular: Normal rate, regular rhythm and normal heart sounds.   No murmur heard. Respiratory: Effort normal and breath sounds normal. No respiratory distress. She has no wheezes.  GI: Soft. Bowel sounds are normal. There is no tenderness.  Genitourinary: No bleeding in the vagina. Vaginal discharge (small amount of thick yellow discharge; no pooling) found.  Neurological: She is alert and oriented to person, place, and time.  Skin: Skin is warm and dry. She is not diaphoretic.  Psychiatric: She has a normal mood and affect. Her behavior is normal. Judgment and thought content normal.   Dilation: 1 Effacement (%): Thick Cervical Position: Posterior Exam by:: Estanislado Spire NP  Fetal Tracing:  Baseline: 135 Variability: moderate Accelerations: 15x15 Decelerations: none  Toco: initially every 3-6 mins; resolved with IV fluids MAU Course  Procedures Results for orders placed or performed during the hospital encounter of 03/05/17 (from the past 24 hour(s))  Urinalysis, Routine w reflex microscopic     Status: Abnormal   Collection Time: 03/05/17 10:56 AM  Result Value Ref Range   Color, Urine YELLOW YELLOW   APPearance HAZY (A) CLEAR   Specific Gravity, Urine 1.016 1.005 - 1.030   pH 7.0 5.0 - 8.0   Glucose, UA NEGATIVE NEGATIVE mg/dL   Hgb urine dipstick NEGATIVE NEGATIVE   Bilirubin Urine NEGATIVE NEGATIVE   Ketones, ur 20 (A) NEGATIVE mg/dL   Protein, ur 30 (A) NEGATIVE mg/dL   Nitrite NEGATIVE NEGATIVE   Leukocytes, UA NEGATIVE NEGATIVE   RBC / HPF 0-5 0 - 5 RBC/hpf   WBC, UA 0-5 0 - 5 WBC/hpf   Bacteria, UA NONE SEEN NONE SEEN   Squamous Epithelial / LPF 6-30 (A) NONE SEEN   Mucous PRESENT   Wet prep, genital     Status: Abnormal   Collection Time: 03/05/17 11:55 AM  Result Value Ref Range   Yeast Wet Prep HPF POC NONE SEEN  NONE SEEN   Trich, Wet Prep NONE SEEN NONE SEEN   Clue Cells Wet Prep HPF POC PRESENT (A) NONE SEEN   WBC, Wet Prep HPF POC MODERATE (A) NONE SEEN   Sperm NONE SEEN   Fetal fibronectin     Status: None   Collection Time: 03/05/17 11:55 AM  Result Value Ref Range   Fetal Fibronectin NEGATIVE NEGATIVE  CBC     Status: Abnormal   Collection Time: 03/05/17 12:06 PM  Result Value Ref Range   WBC 15.6 (H) 4.0 - 10.5 K/uL   RBC 3.68 (L) 3.87 - 5.11 MIL/uL   Hemoglobin 11.2 (L) 12.0 - 15.0 g/dL   HCT 16.1 (  L) 36.0 - 46.0 %   MCV 90.8 78.0 - 100.0 fL   MCH 30.4 26.0 - 34.0 pg   MCHC 33.5 30.0 - 36.0 g/dL   RDW 16.114.4 09.611.5 - 04.515.5 %   Platelets 311 150 - 400 K/uL  Comprehensive metabolic panel     Status: Abnormal   Collection Time: 03/05/17 12:06 PM  Result Value Ref Range   Sodium 135 135 - 145 mmol/L   Potassium 3.2 (L) 3.5 - 5.1 mmol/L   Chloride 106 101 - 111 mmol/L   CO2 21 (L) 22 - 32 mmol/L   Glucose, Bld 90 65 - 99 mg/dL   BUN <5 (L) 6 - 20 mg/dL   Creatinine, Ser 4.090.37 (L) 0.44 - 1.00 mg/dL   Calcium 8.5 (L) 8.9 - 10.3 mg/dL   Total Protein 6.6 6.5 - 8.1 g/dL   Albumin 3.2 (L) 3.5 - 5.0 g/dL   AST 16 15 - 41 U/L   ALT 13 (L) 14 - 54 U/L   Alkaline Phosphatase 77 38 - 126 U/L   Total Bilirubin 0.5 0.3 - 1.2 mg/dL   GFR calc non Af Amer >60 >60 mL/min   GFR calc Af Amer >60 >60 mL/min   Anion gap 8 5 - 15    MDM Reactive NST FFN negative No pooling & fern negative IV fluid bolus & zofran 4 mg IV --- pt reports continued nausea, phenergan 12.5 mg & pepcid 20 mg --- pt reports improvement in symptoms GC/CT & wet prep collected SVE 1/thick/posterior -- cervix unchanged after observation in MAU, FFN negative, & ctx resolved Assessment and Plan  A; 1. Nausea and vomiting in pregnancy   2. Preterm uterine contractions in third trimester, antepartum   3. Vaginal discharge during pregnancy in third trimester   4. BV (bacterial vaginosis)    P: Discharge home Rx flagyl,  zantac & zofran Discussed reasons to return to MAU Schedule f/u with OB  GC/CT pending   Judeth Hornrin Syniah Berne 03/05/2017, 11:29 AM

## 2017-03-05 NOTE — Discharge Instructions (Signed)
Bacterial Vaginosis Bacterial vaginosis is a vaginal infection that occurs when the normal balance of bacteria in the vagina is disrupted. It results from an overgrowth of certain bacteria. This is the most common vaginal infection among women ages 7-44. Because bacterial vaginosis increases your risk for STIs (sexually transmitted infections), getting treated can help reduce your risk for chlamydia, gonorrhea, herpes, and HIV (human immunodeficiency virus). Treatment is also important for preventing complications in pregnant women, because this condition can cause an early (premature) delivery. What are the causes? This condition is caused by an increase in harmful bacteria that are normally present in small amounts in the vagina. However, the reason that the condition develops is not fully understood. What increases the risk? The following factors may make you more likely to develop this condition:  Having a new sexual partner or multiple sexual partners.  Having unprotected sex.  Douching.  Having an intrauterine device (IUD).  Smoking.  Drug and alcohol abuse.  Taking certain antibiotic medicines.  Being pregnant.  You cannot get bacterial vaginosis from toilet seats, bedding, swimming pools, or contact with objects around you. What are the signs or symptoms? Symptoms of this condition include:  Grey or white vaginal discharge. The discharge can also be watery or foamy.  A fish-like odor with discharge, especially after sexual intercourse or during menstruation.  Itching in and around the vagina.  Burning or pain with urination.  Some women with bacterial vaginosis have no signs or symptoms. How is this diagnosed? This condition is diagnosed based on:  Your medical history.  A physical exam of the vagina.  Testing a sample of vaginal fluid under a microscope to look for a large amount of bad bacteria or abnormal cells. Your health care provider may use a cotton swab  or a small wooden spatula to collect the sample.  How is this treated? This condition is treated with antibiotics. These may be given as a pill, a vaginal cream, or a medicine that is put into the vagina (suppository). If the condition comes back after treatment, a second round of antibiotics may be needed. Follow these instructions at home: Medicines  Take over-the-counter and prescription medicines only as told by your health care provider.  Take or use your antibiotic as told by your health care provider. Do not stop taking or using the antibiotic even if you start to feel better. General instructions  If you have a female sexual partner, tell her that you have a vaginal infection. She should see her health care provider and be treated if she has symptoms. If you have a female sexual partner, he does not need treatment.  During treatment: ? Avoid sexual activity until you finish treatment. ? Do not douche. ? Avoid alcohol as directed by your health care provider. ? Avoid breastfeeding as directed by your health care provider.  Drink enough water and fluids to keep your urine clear or pale yellow.  Keep the area around your vagina and rectum clean. ? Wash the area daily with warm water. ? Wipe yourself from front to back after using the toilet.  Keep all follow-up visits as told by your health care provider. This is important. How is this prevented?  Do not douche.  Wash the outside of your vagina with warm water only.  Use protection when having sex. This includes latex condoms and dental dams.  Limit how many sexual partners you have. To help prevent bacterial vaginosis, it is best to have sex with just  one partner (monogamous).  Make sure you and your sexual partner are tested for STIs.  Wear cotton or cotton-lined underwear.  Avoid wearing tight pants and pantyhose, especially during summer.  Limit the amount of alcohol that you drink.  Do not use any products that  contain nicotine or tobacco, such as cigarettes and e-cigarettes. If you need help quitting, ask your health care provider.  Do not use illegal drugs. Where to find more information:  Centers for Disease Control and Prevention: SolutionApps.co.zawww.cdc.gov/std  American Sexual Health Association (ASHA): www.ashastd.org  U.S. Department of Health and Health and safety inspectorHuman Services, Office on Women's Health: ConventionalMedicines.siwww.womenshealth.gov/ or http://www.anderson-williamson.info/https://www.womenshealth.gov/a-z-topics/bacterial-vaginosis Contact a health care provider if:  Your symptoms do not improve, even after treatment.  You have more discharge or pain when urinating.  You have a fever.  You have pain in your abdomen.  You have pain during sex.  You have vaginal bleeding between periods. Summary  Bacterial vaginosis is a vaginal infection that occurs when the normal balance of bacteria in the vagina is disrupted.  Because bacterial vaginosis increases your risk for STIs (sexually transmitted infections), getting treated can help reduce your risk for chlamydia, gonorrhea, herpes, and HIV (human immunodeficiency virus). Treatment is also important for preventing complications in pregnant women, because the condition can cause an early (premature) delivery.  This condition is treated with antibiotic medicines. These may be given as a pill, a vaginal cream, or a medicine that is put into the vagina (suppository). This information is not intended to replace advice given to you by your health care provider. Make sure you discuss any questions you have with your health care provider. Document Released: 08/20/2005 Document Revised: 05/05/2016 Document Reviewed: 05/05/2016 Elsevier Interactive Patient Education  2017 [employer] Preterm Labor and Birth Information The normal length of a pregnancy is 39-41 weeks. Preterm labor is when labor starts before 37 completed weeks of pregnancy. What are the risk factors for preterm labor? Preterm labor is more likely to  occur in women who:  Have certain infections during pregnancy such as a bladder infection, sexually transmitted infection, or infection inside the uterus (chorioamnionitis).  Have a shorter-than-normal cervix.  Have gone into preterm labor before.  Have had surgery on their cervix.  Are younger than age 31 or older than age 31.  Are African American.  Are pregnant with twins or multiple babies (multiple gestation).  Take street drugs or smoke while pregnant.  Do not gain enough weight while pregnant.  Became pregnant shortly after having been pregnant.  What are the symptoms of preterm labor? Symptoms of preterm labor include:  Cramps similar to those that can happen during a menstrual period. The cramps may happen with diarrhea.  Pain in the abdomen or lower back.  Regular uterine contractions that may feel like tightening of the abdomen.  A feeling of increased pressure in the pelvis.  Increased watery or bloody mucus discharge from the vagina.  Water breaking (ruptured amniotic sac).  Why is it important to recognize signs of preterm labor? It is important to recognize signs of preterm labor because babies who are born prematurely may not be fully developed. This can put them at an increased risk for:  Long-term (chronic) heart and lung problems.  Difficulty immediately after birth with regulating body systems, including blood sugar, body temperature, heart rate, and breathing rate.  Bleeding in the brain.  Cerebral palsy.  Learning difficulties.  Death.  These risks are highest for babies who are born before 34 weeks  of pregnancy. How is preterm labor treated? Treatment depends on the length of your pregnancy, your condition, and the health of your baby. It may involve:  Having a stitch (suture) placed in your cervix to prevent your cervix from opening too early (cerclage).  Taking or being given medicines, such as: ? Hormone medicines. These may be  given early in pregnancy to help support the pregnancy. ? Medicine to stop contractions. ? Medicines to help mature the babys lungs. These may be prescribed if the risk of delivery is high. ? Medicines to prevent your baby from developing cerebral palsy.  If the labor happens before 34 weeks of pregnancy, you may need to stay in the hospital. What should I do if I think I am in preterm labor? If you think that you are going into preterm labor, call your health care provider right away. How can I prevent preterm labor in future pregnancies? To increase your chance of having a full-term pregnancy:  Do not use any tobacco products, such as cigarettes, chewing tobacco, and e-cigarettes. If you need help quitting, ask your health care provider.  Do not use street drugs or medicines that have not been prescribed to you during your pregnancy.  Talk with your health care provider before taking any herbal supplements, even if you have been taking them regularly.  Make sure you gain a healthy amount of weight during your pregnancy.  Watch for infection. If you think that you might have an infection, get it checked right away.  Make sure to tell your health care provider if you have gone into preterm labor before.  This information is not intended to replace advice given to you by your health care provider. Make sure you discuss any questions you have with your health care provider. Document Released: 11/10/2003 Document Revised: 01/31/2016 Document Reviewed: 01/11/2016 Elsevier Interactive Patient Education  2018 [employer].

## 2017-03-05 NOTE — MAU Note (Signed)
Last couple days has had pain and pressure in lower abd. Panties and sheets were wet when woke up this morning.  Denies bleeding.  No contractions in EMS on way here.  Has not seen dr in about a month

## 2017-03-05 NOTE — MAU Note (Signed)
Has been out of her nausea meds for about 2 weeks;

## 2017-03-05 NOTE — MAU Note (Signed)
Brought in by EMS for ? Labor; ?SROM @ 0630 this AM; not sure if she is having ucs but she is just uncomfortable all over; hasn't seen her OB in past 2 months... [facility]; has been having N&V throughout the pregnancy;

## 2017-03-07 LAB — GC/CHLAMYDIA PROBE AMP (~~LOC~~) NOT AT ARMC
Chlamydia: NEGATIVE
Neisseria Gonorrhea: NEGATIVE

## 2017-03-11 ENCOUNTER — Encounter (HOSPITAL_COMMUNITY): Payer: Self-pay | Admitting: *Deleted

## 2017-03-11 ENCOUNTER — Telehealth: Payer: Self-pay | Admitting: Obstetrics & Gynecology

## 2017-03-11 ENCOUNTER — Inpatient Hospital Stay (HOSPITAL_COMMUNITY)
Admission: AD | Admit: 2017-03-11 | Discharge: 2017-03-11 | Disposition: A | Discharge status: Home / Self Care | Payer: Medicaid Other | Source: Ambulatory Visit | Attending: Family Medicine | Admitting: Family Medicine

## 2017-03-11 DIAGNOSIS — R109 Unspecified abdominal pain: Secondary | ICD-10-CM | POA: Diagnosis present

## 2017-03-11 DIAGNOSIS — Z3A33 33 weeks gestation of pregnancy: Secondary | ICD-10-CM | POA: Insufficient documentation

## 2017-03-11 DIAGNOSIS — R42 Dizziness and giddiness: Secondary | ICD-10-CM | POA: Diagnosis present

## 2017-03-11 DIAGNOSIS — O4703 False labor before 37 completed weeks of gestation, third trimester: Secondary | ICD-10-CM | POA: Diagnosis not present

## 2017-03-11 HISTORY — DX: Headache: R51

## 2017-03-11 HISTORY — DX: Unspecified infectious disease: B99.9

## 2017-03-11 HISTORY — DX: Headache, unspecified: R51.9

## 2017-03-11 LAB — URINALYSIS, ROUTINE W REFLEX MICROSCOPIC
Bilirubin Urine: NEGATIVE
Glucose, UA: NEGATIVE mg/dL
Ketones, ur: NEGATIVE mg/dL
Leukocytes, UA: NEGATIVE
NITRITE: NEGATIVE
Protein, ur: NEGATIVE mg/dL
SPECIFIC GRAVITY, URINE: 1.006 (ref 1.005–1.030)
pH: 7 (ref 5.0–8.0)

## 2017-03-11 MED ORDER — NIFEDIPINE 10 MG PO CAPS
10.0000 mg | ORAL_CAPSULE | ORAL | Status: DC | PRN
  Administered 2017-03-11: 10 mg via ORAL
  Filled 2017-03-11 (×3): qty 1

## 2017-03-11 MED ORDER — PROMETHAZINE HCL 12.5 MG PO TABS: 12.5000 mg | ORAL_TABLET | Freq: Four times a day (QID) | ORAL | 0 refills | Status: DC | PRN

## 2017-03-11 MED ORDER — PROMETHAZINE HCL 25 MG/ML IJ SOLN
25.0000 mg | Freq: Once | INTRAVENOUS | Status: AC
  Administered 2017-03-11: 25 mg via INTRAVENOUS
  Filled 2017-03-11: qty 1

## 2017-03-11 NOTE — Telephone Encounter (Signed)
Angie called and put patient on schedule for tomorrow.

## 2017-03-11 NOTE — Discharge Instructions (Signed)

## 2017-03-11 NOTE — MAU Provider Note (Signed)
CC:  Chief Complaint  Patient presents with  . Abdominal Pain  . Dizziness     First Provider Initiated Contact with Patient 03/11/17 1559      HPI: Roberta Baker is a 31 y.o. year old 801P0000 female at 2938w6d weeks gestation who presents to MAU reporting contractions every 3 minutes since last night and pelvic pressure. Has been having N/V throughout pregnancy which she C/O today.   Associated Sx:  Vaginal bleeding: None Leaking of fluid: None Fetal movement: Nml Neg urinary complaints, fever, chills, diarrhea, constipation, vaginal discharge.  Neg fFN 03/05/17.   O: Patient Vitals for the past 24 hrs:  BP Temp Temp src Pulse Resp SpO2  03/11/17 1700 (!) 98/58 - - - - -  03/11/17 1523 115/67 98 F (36.7 C) Oral 77 18 100 %    General: NAD Heart: Regular rate Lungs: Normal rate and effort Abd: Soft, NT, Gravid, S=D Pelvic: NEFG, Neg blood.  Dilation: 1 Effacement (%): Thick Cervical Position: Posterior Presentation: Vertex Exam by:: Dorathy KinsmanVirginia Smith, CNM  EFM: 140, Moderate variability, 15 x 15 accelerations, no decelerations Toco: Contractions every 3 minutes, mild-mod  Orders Placed This Encounter  Procedures  . Urinalysis, Routine w reflex microscopic  . Encourage/Reinforce Importance Po Fluids   Meds ordered this encounter  Medications  . NIFEdipine (PROCARDIA) capsule 10 mg  . promethazine (PHENERGAN) 25 mg in dextrose 5% lactated ringers 1,000 mL infusion   Care of pt turned over to Caren Griffinseirdre Jackalynn Art, CNM at [phone]. Phenergen infusing. First dose Procardia given. Contractions improving significantly.  Katrinka BlazingSmith, [place], [place] 03/11/2017 5:17 PM  1810: Looks uncomfortable. Has not vomited, but still nauseous. FHR 150, moderate variability, accelerations present, no decelerations. Toco: rare mild UC SVE: cx posterior, ftp, thick, ballotable head  ASSESSMENt  31 yo G1 at 4438w6d  1. Preterm uterine contractions in third trimester, antepartum   Fetal well-being by  EFM   PLAN: Discharge with PTL precautions Allergies as of 03/11/2017      Reactions   Sulfa Antibiotics Nausea And Vomiting      Medication List    STOP taking these medications   Doxylamine-Pyridoxine 10-10 MG Tbec   ondansetron 4 MG disintegrating tablet Commonly known as:  ZOFRAN ODT   oxyCODONE-acetaminophen 5-325 MG tablet Commonly known as:  PERCOCET/ROXICET   PNV PRENATAL PLUS MULTIVITAMIN 27-1 MG Tabs     TAKE these medications   acetaminophen 500 MG tablet Commonly known as:  TYLENOL Take 500 mg by mouth every 6 (six) hours as needed for moderate pain.   flintstones complete 60 MG chewable tablet Chew 1 tablet by mouth daily.   metroNIDAZOLE 500 MG tablet Commonly known as:  FLAGYL Take 1 tablet (500 mg total) by mouth 2 (two) times daily.   promethazine 12.5 MG tablet Commonly known as:  PHENERGAN Take 1 tablet (12.5 mg total) by mouth every 6 (six) hours as needed for nausea or vomiting.   ranitidine 150 MG tablet Commonly known as:  ZANTAC Take 1 tablet (150 mg total) by mouth 2 (two) times daily. What changed:  when to take this  reasons to take this      Follow-up Information    [facility] in [facility] day(s).   Contact information: [address] [place] [phone]       [facility] .   Specialty:  Obstetrics and Gynecology Contact information: [address] [place] [id number] [phone]  Danae Orleans, CNM 03/11/2017 6:19 PM

## 2017-03-11 NOTE — MAU Note (Addendum)
abd pain and pressure.  Feels like the baby is trying to come out. Pain started last night, getting worse  Has been light headed/dizzy; last couple hours

## 2017-03-12 ENCOUNTER — Encounter: Payer: Self-pay | Admitting: Women's Health

## 2017-03-12 ENCOUNTER — Ambulatory Visit (INDEPENDENT_AMBULATORY_CARE_PROVIDER_SITE_OTHER): Payer: Medicaid Other | Admitting: Women's Health

## 2017-03-12 VITALS — BP 82/58 | HR 82 | Wt 123.0 lb

## 2017-03-12 DIAGNOSIS — Z3A34 34 weeks gestation of pregnancy: Secondary | ICD-10-CM

## 2017-03-12 DIAGNOSIS — O98313 Other infections with a predominantly sexual mode of transmission complicating pregnancy, third trimester: Secondary | ICD-10-CM | POA: Diagnosis not present

## 2017-03-12 DIAGNOSIS — O26843 Uterine size-date discrepancy, third trimester: Secondary | ICD-10-CM | POA: Diagnosis not present

## 2017-03-12 DIAGNOSIS — R82998 Other abnormal findings in urine: Secondary | ICD-10-CM

## 2017-03-12 DIAGNOSIS — O0933 Supervision of pregnancy with insufficient antenatal care, third trimester: Secondary | ICD-10-CM

## 2017-03-12 DIAGNOSIS — B009 Herpesviral infection, unspecified: Secondary | ICD-10-CM | POA: Diagnosis not present

## 2017-03-12 DIAGNOSIS — Z331 Pregnant state, incidental: Secondary | ICD-10-CM | POA: Diagnosis not present

## 2017-03-12 DIAGNOSIS — F418 Other specified anxiety disorders: Secondary | ICD-10-CM

## 2017-03-12 DIAGNOSIS — Z3403 Encounter for supervision of normal first pregnancy, third trimester: Secondary | ICD-10-CM

## 2017-03-12 DIAGNOSIS — R8299 Other abnormal findings in urine: Secondary | ICD-10-CM

## 2017-03-12 DIAGNOSIS — Z1389 Encounter for screening for other disorder: Secondary | ICD-10-CM | POA: Diagnosis not present

## 2017-03-12 DIAGNOSIS — O0932 Supervision of pregnancy with insufficient antenatal care, second trimester: Secondary | ICD-10-CM

## 2017-03-12 LAB — POCT URINALYSIS DIPSTICK
Glucose, UA: NEGATIVE
NITRITE UA: NEGATIVE

## 2017-03-12 MED ORDER — ESCITALOPRAM OXALATE 10 MG PO TABS: 10.0000 mg | ORAL_TABLET | Freq: Every day | ORAL | 6 refills | Status: DC

## 2017-03-12 MED ORDER — ACYCLOVIR 400 MG PO TABS: 400.0000 mg | ORAL_TABLET | Freq: Three times a day (TID) | ORAL | 3 refills | Status: DC

## 2017-03-12 NOTE — Progress Notes (Signed)
Low-risk OB appointment G1P0000 5568w0d Estimated Date of Delivery: 04/23/17 BP (!) 82/58   Pulse 82   Wt 123 lb (55.8 kg)   LMP 07/05/2016   BMI 24.02 kg/m   BP, weight, and urine reviewed.  Refer to obstetrical flow sheet for FH & FHR.  Reports good fm.  Denies regular uc's, lof, vb, or uti s/s. No care since 24wks. [place] in [place] transportation, [place]. Never came for pn2. Reports dep/anxiety- feels she needs to be on meds- was on klonopin prior to pregnancy. Rx lexapro 10mg  today- understands can take few weeks to notice improvement. N/v- has phenergan at home- threw it up this am, declines zofran odt 'didn't work' or phenergan suppositories.  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 Recommended Tdap at HD/PCP per CDC guidelines.  Plan:  Continue routine obstetrical care  F/U asap for efw/afi u/s for s<d and pn2 (no visit), then 2wks for OB appointment  UDS today- was not done at new ob, also urine cx

## 2017-03-12 NOTE — Patient Instructions (Addendum)
You will have your sugar test next visit.  Please do not eat or drink anything after midnight the night before you come, not even water.  You will be here for at least two hours.     Call the office [phone]) or go to [facility] if:  You begin to have strong, frequent contractions  Your water breaks.  Sometimes it is a big gush of fluid, sometimes it is just a trickle that keeps getting your panties wet or running down your legs  You have vaginal bleeding.  It is normal to have a small amount of spotting if your cervix was checked.   You don't feel your baby moving like normal.  If you don't, get you something to eat and drink and lay down and focus on feeling your baby move.  You should feel at least 10 movements in 2 hours.  If you don't, you should call the office or go to [facility].    Tdap Vaccine  It is recommended that you get the Tdap vaccine during the third trimester of EACH pregnancy to help protect your baby from getting pertussis (whooping cough)  27-36 weeks is the BEST time to do this so that you can pass the protection on to your baby. During pregnancy is better than after pregnancy, but if you are unable to get it during pregnancy it will be offered at the hospital.   You can get this vaccine at the health department or your family doctor  Everyone who will be around your baby should also be up-to-date on their vaccines. Adults (who are not pregnant) only need 1 dose of Tdap during adulthood.   [place] Pediatricians/Family Doctors:  [facility] [phone]            [facility] [phone]                 [facility] [phone] (usually not accepting new patients unless you have family there already, you are always welcome to call and ask)            [facility] Pediatricians/Family Doctors:   [facility]: [phone]  [facility]: [phone]     Preterm Labor and Birth  Information The normal length of a pregnancy is 39-41 weeks. Preterm labor is when labor starts before 37 completed weeks of pregnancy. What are the risk factors for preterm labor? Preterm labor is more likely to occur in women who:  Have certain infections during pregnancy such as a bladder infection, sexually transmitted infection, or infection inside the uterus (chorioamnionitis).  Have a shorter-than-normal cervix.  Have gone into preterm labor before.  Have had surgery on their cervix.  Are younger than age 31 or older than age 31.  Are African American.  Are pregnant with twins or multiple babies (multiple gestation).  Take street drugs or smoke while pregnant.  Do not gain enough weight while pregnant.  Became pregnant shortly after having been pregnant.  What are the symptoms of preterm labor? Symptoms of preterm labor include:  Cramps similar to those that can happen during a menstrual period. The cramps may happen with diarrhea.  Pain in the abdomen or lower back.  Regular uterine contractions that may feel like tightening of the abdomen.  A feeling of increased pressure in the pelvis.  Increased watery or bloody mucus discharge from the vagina.  Water breaking (ruptured amniotic sac).  Why is it important to recognize signs of preterm labor? It is important to  recognize signs of preterm labor because babies who are born prematurely may not be fully developed. This can put them at an increased risk for:  Long-term (chronic) heart and lung problems.  Difficulty immediately after birth with regulating body systems, including blood sugar, body temperature, heart rate, and breathing rate.  Bleeding in the brain.  Cerebral palsy.  Learning difficulties.  Death.  These risks are highest for babies who are born before 34 weeks of pregnancy. How is preterm labor treated? Treatment depends on the length of your pregnancy, your condition, and the health of  your baby. It may involve:  Having a stitch (suture) placed in your cervix to prevent your cervix from opening too early (cerclage).  Taking or being given medicines, such as: ? Hormone medicines. These may be given early in pregnancy to help support the pregnancy. ? Medicine to stop contractions. ? Medicines to help mature the baby's lungs. These may be prescribed if the risk of delivery is high. ? Medicines to prevent your baby from developing cerebral palsy.  If the labor happens before 34 weeks of pregnancy, you may need to stay in the hospital. What should I do if I think I am in preterm labor? If you think that you are going into preterm labor, call your health care provider right away. How can I prevent preterm labor in future pregnancies? To increase your chance of having a full-term pregnancy:  Do not use any tobacco products, such as cigarettes, chewing tobacco, and e-cigarettes. If you need help quitting, ask your health care provider.  Do not use street drugs or medicines that have not been prescribed to you during your pregnancy.  Talk with your health care provider before taking any herbal supplements, even if you have been taking them regularly.  Make sure you gain a healthy amount of weight during your pregnancy.  Watch for infection. If you think that you might have an infection, get it checked right away.  Make sure to tell your health care provider if you have gone into preterm labor before.  This information is not intended to replace advice given to you by your health care provider. Make sure you discuss any questions you have with your health care provider. Document Released: 11/10/2003 Document Revised: 01/31/2016 Document Reviewed: 01/11/2016 Elsevier Interactive Patient Education  2018 [employer].

## 2017-03-13 ENCOUNTER — Encounter: Payer: Self-pay | Admitting: Women's Health

## 2017-03-13 DIAGNOSIS — F1290 Cannabis use, unspecified, uncomplicated: Secondary | ICD-10-CM | POA: Insufficient documentation

## 2017-03-13 LAB — PMP SCREEN PROFILE (10S), URINE
AMPHETAMINE SCREEN URINE: NEGATIVE ng/mL
BARBITURATE SCREEN URINE: NEGATIVE ng/mL
BENZODIAZEPINE SCREEN, URINE: NEGATIVE ng/mL
CANNABINOIDS UR QL SCN: POSITIVE ng/mL — AB
COCAINE(METAB.)SCREEN, URINE: NEGATIVE ng/mL
Creatinine(Crt), U: 258.9 mg/dL (ref 20.0–300.0)
Methadone Screen, Urine: NEGATIVE ng/mL
OPIATE SCREEN URINE: NEGATIVE ng/mL
OXYCODONE+OXYMORPHONE UR QL SCN: NEGATIVE ng/mL
Ph of Urine: 7.1 (ref 4.5–8.9)
Phencyclidine Qn, Ur: NEGATIVE ng/mL
Propoxyphene Scrn, Ur: NEGATIVE ng/mL

## 2017-03-14 ENCOUNTER — Other Ambulatory Visit: Payer: Medicaid Other

## 2017-03-14 ENCOUNTER — Encounter: Payer: Self-pay | Admitting: Women's Health

## 2017-03-14 ENCOUNTER — Other Ambulatory Visit: Payer: Self-pay | Admitting: Women's Health

## 2017-03-14 ENCOUNTER — Ambulatory Visit (INDEPENDENT_AMBULATORY_CARE_PROVIDER_SITE_OTHER): Payer: Medicaid Other

## 2017-03-14 DIAGNOSIS — K0889 Other specified disorders of teeth and supporting structures: Secondary | ICD-10-CM

## 2017-03-14 DIAGNOSIS — Z3403 Encounter for supervision of normal first pregnancy, third trimester: Secondary | ICD-10-CM

## 2017-03-14 DIAGNOSIS — F418 Other specified anxiety disorders: Secondary | ICD-10-CM

## 2017-03-14 DIAGNOSIS — O26843 Uterine size-date discrepancy, third trimester: Secondary | ICD-10-CM

## 2017-03-14 DIAGNOSIS — B009 Herpesviral infection, unspecified: Secondary | ICD-10-CM

## 2017-03-14 DIAGNOSIS — O0932 Supervision of pregnancy with insufficient antenatal care, second trimester: Secondary | ICD-10-CM

## 2017-03-14 DIAGNOSIS — Z8759 Personal history of other complications of pregnancy, childbirth and the puerperium: Secondary | ICD-10-CM | POA: Insufficient documentation

## 2017-03-14 NOTE — Progress Notes (Signed)
US 34+2 wks,cephalic,ant pl gr 2,normal ovaries bilat,BPP 8/8,AFI 12 CM,FHR 158 BPM,RI .66,.61=74%,EFW 2022 g 19%,AC 5%,HC 7.3%,FL 15%

## 2017-03-16 LAB — CBC
HEMATOCRIT: 37.5 % (ref 34.0–46.6)
HEMOGLOBIN: 12.1 g/dL (ref 11.1–15.9)
MCH: 29.7 pg (ref 26.6–33.0)
MCHC: 32.3 g/dL (ref 31.5–35.7)
MCV: 92 fL (ref 79–97)
PLATELETS: 346 10*3/uL (ref 150–379)
RBC: 4.07 x10E6/uL (ref 3.77–5.28)
RDW: 14.9 % (ref 12.3–15.4)
WBC: 14.4 10*3/uL — ABNORMAL HIGH (ref 3.4–10.8)

## 2017-03-16 LAB — RPR: RPR: NONREACTIVE

## 2017-03-16 LAB — GLUCOSE TOLERANCE, 2 HOURS W/ 1HR
GLUCOSE, 2 HOUR: 102 mg/dL (ref 65–152)
Glucose, 1 hour: 134 mg/dL (ref 65–179)
Glucose, Fasting: 79 mg/dL (ref 65–91)

## 2017-03-16 LAB — HIV ANTIBODY (ROUTINE TESTING W REFLEX): HIV SCREEN 4TH GENERATION: NONREACTIVE

## 2017-03-16 LAB — ANTIBODY SCREEN: Antibody Screen: NEGATIVE

## 2017-03-18 ENCOUNTER — Ambulatory Visit (INDEPENDENT_AMBULATORY_CARE_PROVIDER_SITE_OTHER): Payer: Medicaid Other | Admitting: Obstetrics and Gynecology

## 2017-03-18 ENCOUNTER — Encounter: Payer: Self-pay | Admitting: Obstetrics and Gynecology

## 2017-03-18 VITALS — BP 116/58 | HR 100 | Wt 127.0 lb

## 2017-03-18 DIAGNOSIS — Z3A34 34 weeks gestation of pregnancy: Secondary | ICD-10-CM | POA: Diagnosis not present

## 2017-03-18 DIAGNOSIS — Z1389 Encounter for screening for other disorder: Secondary | ICD-10-CM | POA: Diagnosis not present

## 2017-03-18 DIAGNOSIS — Z331 Pregnant state, incidental: Secondary | ICD-10-CM | POA: Diagnosis not present

## 2017-03-18 DIAGNOSIS — O0990 Supervision of high risk pregnancy, unspecified, unspecified trimester: Secondary | ICD-10-CM

## 2017-03-18 DIAGNOSIS — O09893 Supervision of other high risk pregnancies, third trimester: Secondary | ICD-10-CM | POA: Diagnosis not present

## 2017-03-18 DIAGNOSIS — O0933 Supervision of pregnancy with insufficient antenatal care, third trimester: Secondary | ICD-10-CM | POA: Diagnosis not present

## 2017-03-18 DIAGNOSIS — O365990 Maternal care for other known or suspected poor fetal growth, unspecified trimester, not applicable or unspecified: Secondary | ICD-10-CM

## 2017-03-18 DIAGNOSIS — O365930 Maternal care for other known or suspected poor fetal growth, third trimester, not applicable or unspecified: Secondary | ICD-10-CM | POA: Diagnosis not present

## 2017-03-18 LAB — POCT URINALYSIS DIPSTICK
Blood, UA: NEGATIVE
Ketones, UA: NEGATIVE
LEUKOCYTES UA: NEGATIVE
NITRITE UA: NEGATIVE

## 2017-03-18 MED ORDER — PROMETHAZINE HCL 12.5 MG PO TABS
12.5000 mg | ORAL_TABLET | Freq: Four times a day (QID) | ORAL | 0 refills | Status: DC | PRN
Start: 2017-03-18 — End: 2017-04-02

## 2017-03-18 NOTE — Progress Notes (Signed)
Roberta Baker is a 31 y.o. female    High Risk Pregnancy HROB Diagnosis(es):  IUGR AC less than 5th percentile total EFW 20%ile  G1P0000 4640w6d Estimated Date of Delivery: 04/23/17    HPI: The patient is being seen today for ongoing management of SGA. Today she reports no new complaints. Patient reports good fetal movement, denies any bleeding and no rupture of membranes symptoms or regular contractions.   BP weight and urine results reviewed and noted. Blood pressure (!) 116/58, pulse 100, weight 127 lb (57.6 kg), last menstrual period 07/05/2016.  Fundal Height:  32cm Fetal Heart rate:  135 bpm Physical Examination: Abdomen - soft, nontender, nondistended, no masses or organomegaly                                     Pelvic - not indicated                                     Edema:  negative  Urinalysis:Urine results: notable for 1+ glucose, trace protein  Fetal Surveillance Testing today:  NST  Lab and sonogram results have been reviewed.   Assessment:  1.  Pregnancy at 4540w6d,  G1P0000   :  Estimated Date of Delivery: 04/23/17                         2. HROB:  IUGR-AC less than 50th percentile                        3.  Medication(s) Plans:  Continue pherngan for N/V  Treatment Plan:  Return 2x a week for HROB  Follow up in 3 days for appointment for high risk OB care, IUGR-AC less than 50th percentile, repeat U/S at 37 weeks IOL at 6837- 39 wk as clinically indicated     By signing my name belo w, I, Roberta Baker, attest that this documentation has been prepared under the direction and in the presence of Tilda BurrowFerguson, Rainbow Salman V, MD. Electronically Signed: Redge GainerIzna Baker, ED Scribe. 03/18/17. 2:37 PM.  I personally performed the services described in this documentation, which was SCRIBED in my presence. The recorded information has been reviewed and considered accurate. It has been edited as necessary during review. Tilda BurrowFERGUSON,Faustino Luecke V, MD

## 2017-03-19 ENCOUNTER — Other Ambulatory Visit: Payer: Self-pay | Admitting: Obstetrics and Gynecology

## 2017-03-19 DIAGNOSIS — O365930 Maternal care for other known or suspected poor fetal growth, third trimester, not applicable or unspecified: Secondary | ICD-10-CM

## 2017-03-21 ENCOUNTER — Ambulatory Visit (INDEPENDENT_AMBULATORY_CARE_PROVIDER_SITE_OTHER): Payer: Medicaid Other

## 2017-03-21 ENCOUNTER — Encounter: Payer: Self-pay | Admitting: Obstetrics & Gynecology

## 2017-03-21 ENCOUNTER — Ambulatory Visit (INDEPENDENT_AMBULATORY_CARE_PROVIDER_SITE_OTHER): Payer: Medicaid Other | Admitting: Obstetrics & Gynecology

## 2017-03-21 VITALS — BP 112/62 | HR 71 | Wt 125.5 lb

## 2017-03-21 DIAGNOSIS — O365930 Maternal care for other known or suspected poor fetal growth, third trimester, not applicable or unspecified: Secondary | ICD-10-CM

## 2017-03-21 DIAGNOSIS — Z3A35 35 weeks gestation of pregnancy: Secondary | ICD-10-CM | POA: Diagnosis not present

## 2017-03-21 DIAGNOSIS — O09893 Supervision of other high risk pregnancies, third trimester: Secondary | ICD-10-CM | POA: Diagnosis not present

## 2017-03-21 DIAGNOSIS — Z1389 Encounter for screening for other disorder: Secondary | ICD-10-CM | POA: Diagnosis not present

## 2017-03-21 DIAGNOSIS — Z331 Pregnant state, incidental: Secondary | ICD-10-CM

## 2017-03-21 DIAGNOSIS — B009 Herpesviral infection, unspecified: Secondary | ICD-10-CM

## 2017-03-21 DIAGNOSIS — F418 Other specified anxiety disorders: Secondary | ICD-10-CM

## 2017-03-21 DIAGNOSIS — O0990 Supervision of high risk pregnancy, unspecified, unspecified trimester: Secondary | ICD-10-CM

## 2017-03-21 DIAGNOSIS — K0889 Other specified disorders of teeth and supporting structures: Secondary | ICD-10-CM

## 2017-03-21 DIAGNOSIS — O0932 Supervision of pregnancy with insufficient antenatal care, second trimester: Secondary | ICD-10-CM

## 2017-03-21 DIAGNOSIS — O365990 Maternal care for other known or suspected poor fetal growth, unspecified trimester, not applicable or unspecified: Secondary | ICD-10-CM

## 2017-03-21 LAB — POCT URINALYSIS DIPSTICK
Glucose, UA: NEGATIVE
KETONES UA: NEGATIVE
Nitrite, UA: NEGATIVE
PROTEIN UA: NEGATIVE

## 2017-03-21 MED ORDER — OMEPRAZOLE 20 MG PO CPDR: 20.0000 mg | DELAYED_RELEASE_CAPSULE | Freq: Every day | ORAL | 6 refills | Status: DC

## 2017-03-21 NOTE — Progress Notes (Signed)
Fetal Surveillance Testing today:  BPP 8/8   High Risk Pregnancy Diagnosis(es):   FGR  G1P0000 399w2d Estimated Date of Delivery: 04/23/17  Blood pressure 112/62, pulse 71, weight 125 lb 8 oz (56.9 kg), last menstrual period 07/05/2016.  Urinalysis: Negative   HPI: The patient is being seen today for ongoing management of FGR. Today she reports baby is very active   BP weight and urine results all reviewed and noted. Patient reports good fetal movement, denies any bleeding and no rupture of membranes symptoms or regular contractions.  Fundal Height:  34 Fetal Heart rate:  155 Edema:  none  Patient is without complaints other than noted in her HPI. All questions were answered.  All lab and sonogram results have been reviewed. Comments:    Assessment:  1.  Pregnancy at 4299w2d,  Estimated Date of Delivery: 04/23/17 :                          2.  FGR                        3.    Medication(s) Plans:  Acyclovir 3 times daily  Treatment Plan:  Continue twice weekly surveillance deliver 37-39 weeks as clinically indicated  Return in about 4 days (around 03/25/2017) for NST, HROB. for appointment for high risk OB care  No orders of the defined types were placed in this encounter.  Orders Placed This Encounter  Procedures  . POCT Urinalysis Dipstick

## 2017-03-21 NOTE — Progress Notes (Signed)
US 35+2 wks,cephalic,BPP 8/8,FHR 155 bpm,afi 12.7 cm,ant pl gr 1,RI .65,.68,.61=77%

## 2017-03-25 ENCOUNTER — Emergency Department (HOSPITAL_COMMUNITY)
Admission: EM | Admit: 2017-03-25 | Discharge: 2017-03-25 | Disposition: A | Discharge status: Home / Self Care | Payer: Medicaid Other | Attending: Emergency Medicine | Admitting: Emergency Medicine

## 2017-03-25 ENCOUNTER — Encounter (HOSPITAL_COMMUNITY): Payer: Self-pay

## 2017-03-25 ENCOUNTER — Other Ambulatory Visit: Payer: Medicaid Other | Admitting: Obstetrics and Gynecology

## 2017-03-25 ENCOUNTER — Encounter (HOSPITAL_COMMUNITY): Payer: Self-pay | Admitting: *Deleted

## 2017-03-25 ENCOUNTER — Inpatient Hospital Stay (EMERGENCY_DEPARTMENT_HOSPITAL)
Admission: AD | Admit: 2017-03-25 | Discharge: 2017-03-25 | Disposition: A | Discharge status: Home / Self Care | Payer: Medicaid Other | Source: Ambulatory Visit | Attending: Family Medicine | Admitting: Family Medicine

## 2017-03-25 DIAGNOSIS — F17210 Nicotine dependence, cigarettes, uncomplicated: Secondary | ICD-10-CM | POA: Insufficient documentation

## 2017-03-25 DIAGNOSIS — O4702 False labor before 37 completed weeks of gestation, second trimester: Secondary | ICD-10-CM

## 2017-03-25 DIAGNOSIS — Z3A35 35 weeks gestation of pregnancy: Secondary | ICD-10-CM

## 2017-03-25 DIAGNOSIS — Z3493 Encounter for supervision of normal pregnancy, unspecified, third trimester: Secondary | ICD-10-CM

## 2017-03-25 DIAGNOSIS — Z3A36 36 weeks gestation of pregnancy: Secondary | ICD-10-CM | POA: Insufficient documentation

## 2017-03-25 DIAGNOSIS — O99333 Smoking (tobacco) complicating pregnancy, third trimester: Secondary | ICD-10-CM | POA: Diagnosis not present

## 2017-03-25 DIAGNOSIS — Z79899 Other long term (current) drug therapy: Secondary | ICD-10-CM | POA: Insufficient documentation

## 2017-03-25 DIAGNOSIS — O219 Vomiting of pregnancy, unspecified: Secondary | ICD-10-CM

## 2017-03-25 LAB — I-STAT CHEM 8, ED
BUN: <3 mg/dL — ABNORMAL LOW (ref 6–20)
Calcium, Ion: 1.03 mmol/L — ABNORMAL LOW (ref 1.15–1.40)
Chloride: 104 mmol/L (ref 101–111)
Creatinine, Ser: 0.4 mg/dL — ABNORMAL LOW (ref 0.44–1.00)
Glucose, Bld: 83 mg/dL (ref 65–99)
HEMATOCRIT: 30 % — AB (ref 36.0–46.0)
HEMOGLOBIN: 10.2 g/dL — AB (ref 12.0–15.0)
POTASSIUM: 4.1 mmol/L (ref 3.5–5.1)
SODIUM: 138 mmol/L (ref 135–145)
TCO2: 22 mmol/L (ref 0–100)

## 2017-03-25 LAB — URINALYSIS, ROUTINE W REFLEX MICROSCOPIC
Bilirubin Urine: NEGATIVE
Glucose, UA: NEGATIVE mg/dL
Hgb urine dipstick: NEGATIVE
Ketones, ur: 20 mg/dL — AB
Leukocytes, UA: NEGATIVE
Nitrite: NEGATIVE
PH: 9 — AB (ref 5.0–8.0)
Protein, ur: 30 mg/dL — AB
SPECIFIC GRAVITY, URINE: 1.017 (ref 1.005–1.030)

## 2017-03-25 MED ORDER — ONDANSETRON 8 MG PO TBDP
8.0000 mg | ORAL_TABLET | Freq: Once | ORAL | Status: AC
  Administered 2017-03-25: 8 mg via ORAL
  Filled 2017-03-25: qty 1

## 2017-03-25 MED ORDER — ONDANSETRON 8 MG PO TBDP: 8.0000 mg | ORAL_TABLET | Freq: Three times a day (TID) | ORAL | 3 refills | Status: DC | PRN

## 2017-03-25 MED ORDER — ONDANSETRON HCL 4 MG/2ML IJ SOLN
4.0000 mg | Freq: Once | INTRAMUSCULAR | Status: AC
Start: 2017-03-25 — End: 2017-03-25
  Administered 2017-03-25: 4 mg via INTRAVENOUS
  Filled 2017-03-25: qty 2

## 2017-03-25 MED ORDER — LACTATED RINGERS IV BOLUS (SEPSIS)
1000.0000 mL | Freq: Once | INTRAVENOUS | Status: AC
  Administered 2017-03-25: 1000 mL via INTRAVENOUS

## 2017-03-25 MED ORDER — SODIUM CHLORIDE 0.9 % IV BOLUS (SEPSIS)
1000.0000 mL | Freq: Once | INTRAVENOUS | Status: AC
  Administered 2017-03-25: 1000 mL via INTRAVENOUS

## 2017-03-25 NOTE — Discharge Instructions (Signed)
Third Trimester of Pregnancy The third trimester is from week 28 through week 40 (months 7 through 9). The third trimester is a time when the unborn baby (fetus) is growing rapidly. At the end of the ninth month, the fetus is about 20 inches in length and weighs 6-10 pounds. Body changes during your third trimester Your body will continue to go through many changes during pregnancy. The changes vary from woman to woman. During the third trimester:  Your weight will continue to increase. You can expect to gain 25-35 pounds (11-16 kg) by the end of the pregnancy.  You may begin to get stretch marks on your hips, abdomen, and breasts.  You may urinate more often because the fetus is moving lower into your pelvis and pressing on your bladder.  You may develop or continue to have heartburn. This is caused by increased hormones that slow down muscles in the digestive tract.  You may develop or continue to have constipation because increased hormones slow digestion and cause the muscles that push waste through your intestines to relax.  You may develop hemorrhoids. These are swollen veins (varicose veins) in the rectum that can itch or be painful.  You may develop swollen, bulging veins (varicose veins) in your legs.  You may have increased body aches in the pelvis, back, or thighs. This is due to weight gain and increased hormones that are relaxing your joints.  You may have changes in your hair. These can include thickening of your hair, rapid growth, and changes in texture. Some women also have hair loss during or after pregnancy, or hair that feels dry or thin. Your hair will most likely return to normal after your baby is born.  Your breasts will continue to grow and they will continue to become tender. A yellow fluid (colostrum) may leak from your breasts. This is the first milk you are producing for your baby.  Your belly button may stick out.  You may notice more swelling in your hands,  face, or ankles.  You may have increased tingling or numbness in your hands, arms, and legs. The skin on your belly may also feel numb.  You may feel short of breath because of your expanding uterus.  You may have more problems sleeping. This can be caused by the size of your belly, increased need to urinate, and an increase in your body's metabolism.  You may notice the fetus "dropping," or moving lower in your abdomen (lightening).  You may have increased vaginal discharge.  You may notice your joints feel loose and you may have pain around your pelvic bone.  What to expect at prenatal visits You will have prenatal exams every 2 weeks until week 36. Then you will have weekly prenatal exams. During a routine prenatal visit:  You will be weighed to make sure you and the baby are growing normally.  Your blood pressure will be taken.  Your abdomen will be measured to track your baby's growth.  The fetal heartbeat will be listened to.  Any test results from the previous visit will be discussed.  You may have a cervical check near your due date to see if your cervix has softened or thinned (effaced).  You will be tested for Group B streptococcus. This happens between 35 and 37 weeks.  Your health care provider may ask you:  What your birth plan is.  How you are feeling.  If you are feeling the baby move.  If you have had   any abnormal symptoms, such as leaking fluid, bleeding, severe headaches, or abdominal cramping.  If you are using any tobacco products, including cigarettes, chewing tobacco, and electronic cigarettes.  If you have any questions.  Other tests or screenings that may be performed during your third trimester include:  Blood tests that check for low iron levels (anemia).  Fetal testing to check the health, activity level, and growth of the fetus. Testing is done if you have certain medical conditions or if there are problems during the  pregnancy.  Nonstress test (NST). This test checks the health of your baby to make sure there are no signs of problems, such as the baby not getting enough oxygen. During this test, a belt is placed around your belly. The baby is made to move, and its heart rate is monitored during movement.  What is false labor? False labor is a condition in which you feel small, irregular tightenings of the muscles in the womb (contractions) that usually go away with rest, changing position, or drinking water. These are called Braxton Hicks contractions. Contractions may last for hours, days, or even weeks before true labor sets in. If contractions come at regular intervals, become more frequent, increase in intensity, or become painful, you should see your health care provider. What are the signs of labor?  Abdominal cramps.  Regular contractions that start at 10 minutes apart and become stronger and more frequent with time.  Contractions that start on the top of the uterus and spread down to the lower abdomen and back.  Increased pelvic pressure and dull back pain.  A watery or bloody mucus discharge that comes from the vagina.  Leaking of amniotic fluid. This is also known as your "water breaking." It could be a slow trickle or a gush. Let your health care provider know if it has a color or strange odor. If you have any of these signs, call your health care provider right away, even if it is before your due date. Follow these instructions at home: Medicines  Follow your health care provider's instructions regarding medicine use. Specific medicines may be either safe or unsafe to take during pregnancy.  Take a prenatal vitamin that contains at least 600 micrograms (mcg) of folic acid.  If you develop constipation, try taking a stool softener if your health care provider approves. Eating and drinking  Eat a balanced diet that includes fresh fruits and vegetables, whole grains, good sources of protein  such as meat, eggs, or tofu, and low-fat dairy. Your health care provider will help you determine the amount of weight gain that is right for you.  Avoid raw meat and uncooked cheese. These carry germs that can cause birth defects in the baby.  If you have low calcium intake from food, talk to your health care provider about whether you should take a daily calcium supplement.  Eat four or five small meals rather than three large meals a day.  Limit foods that are high in fat and processed sugars, such as fried and sweet foods.  To prevent constipation: ? Drink enough fluid to keep your urine clear or pale yellow. ? Eat foods that are high in fiber, such as fresh fruits and vegetables, whole grains, and beans. Activity  Exercise only as directed by your health care provider. Most women can continue their usual exercise routine during pregnancy. Try to exercise for 30 minutes at least 5 days a week. Stop exercising if you experience uterine contractions.  Avoid heavy   lifting.  Do not exercise in extreme heat or humidity, or at high altitudes.  Wear low-heel, comfortable shoes.  Practice good posture.  You may continue to have sex unless your health care provider tells you otherwise. Relieving pain and discomfort  Take frequent breaks and rest with your legs elevated if you have leg cramps or low back pain.  Take warm sitz baths to soothe any pain or discomfort caused by hemorrhoids. Use hemorrhoid cream if your health care provider approves.  Wear a good support bra to prevent discomfort from breast tenderness.  If you develop varicose veins: ? Wear support pantyhose or compression stockings as told by your healthcare provider. ? Elevate your feet for 15 minutes, 3-4 times a day. Prenatal care  Write down your questions. Take them to your prenatal visits.  Keep all your prenatal visits as told by your health care provider. This is important. Safety  Wear your seat belt at  all times when driving.  Make a list of emergency phone numbers, including numbers for family, friends, the hospital, and police and fire departments. General instructions  Avoid cat litter boxes and soil used by cats. These carry germs that can cause birth defects in the baby. If you have a cat, ask someone to clean the litter box for you.  Do not travel far distances unless it is absolutely necessary and only with the approval of your health care provider.  Do not use hot tubs, steam rooms, or saunas.  Do not drink alcohol.  Do not use any products that contain nicotine or tobacco, such as cigarettes and e-cigarettes. If you need help quitting, ask your health care provider.  Do not use any medicinal herbs or unprescribed drugs. These chemicals affect the formation and growth of the baby.  Do not douche or use tampons or scented sanitary pads.  Do not cross your legs for long periods of time.  To prepare for the arrival of your baby: ? Take prenatal classes to understand, practice, and ask questions about labor and delivery. ? Make a trial run to the hospital. ? Visit the hospital and tour the maternity area. ? Arrange for maternity or paternity leave through employers. ? Arrange for family and friends to take care of pets while you are in the hospital. ? Purchase a rear-facing car seat and make sure you know how to install it in your car. ? Pack your hospital bag. ? Prepare the baby's nursery. Make sure to remove all pillows and stuffed animals from the baby's crib to prevent suffocation.  Visit your dentist if you have not gone during your pregnancy. Use a soft toothbrush to brush your teeth and be gentle when you floss. Contact a health care provider if:  You are unsure if you are in labor or if your water has broken.  You become dizzy.  You have mild pelvic cramps, pelvic pressure, or nagging pain in your abdominal area.  You have lower back pain.  You have persistent  nausea, vomiting, or diarrhea.  You have an unusual or bad smelling vaginal discharge.  You have pain when you urinate. Get help right away if:  Your water breaks before 37 weeks.  You have regular contractions less than 5 minutes apart before 37 weeks.  You have a fever.  You are leaking fluid from your vagina.  You have spotting or bleeding from your vagina.  You have severe abdominal pain or cramping.  You have rapid weight loss or weight gain.    You have shortness of breath with chest pain.  You notice sudden or extreme swelling of your face, hands, ankles, feet, or legs.  Your baby makes fewer than 10 movements in 2 hours.  You have severe headaches that do not go away when you take medicine.  You have vision changes. Summary  The third trimester is from week 28 through week 40, months 7 through 9. The third trimester is a time when the unborn baby (fetus) is growing rapidly.  During the third trimester, your discomfort may increase as you and your baby continue to gain weight. You may have abdominal, leg, and back pain, sleeping problems, and an increased need to urinate.  During the third trimester your breasts will keep growing and they will continue to become tender. A yellow fluid (colostrum) may leak from your breasts. This is the first milk you are producing for your baby.  False labor is a condition in which you feel small, irregular tightenings of the muscles in the womb (contractions) that eventually go away. These are called Braxton Hicks contractions. Contractions may last for hours, days, or even weeks before true labor sets in.  Signs of labor can include: abdominal cramps; regular contractions that start at 10 minutes apart and become stronger and more frequent with time; watery or bloody mucus discharge that comes from the vagina; increased pelvic pressure and dull back pain; and leaking of amniotic fluid. This information is not intended to replace advice  given to you by your health care provider. Make sure you discuss any questions you have with your health care provider. Document Released: 08/14/2001 Document Revised: 01/26/2016 Document Reviewed: 10/21/2012 Elsevier Interactive Patient Education  2017 [employer]  

## 2017-03-25 NOTE — ED Notes (Signed)
[facility] Zammit. IV in place per MD orders secured with gauze and coban. Instructed pt to remain NPO.

## 2017-03-25 NOTE — ED Triage Notes (Signed)
Pt reports vomiting x24 hours, states started new anxiety medication and attributes symptoms to this. [redacted] weeks pregnant. Also has abdominal cramping. Denies vaginal bleeding or fluid leakage. Pt states this is her first pregnancy.

## 2017-03-25 NOTE — ED Notes (Signed)
[facility].

## 2017-03-25 NOTE — Progress Notes (Signed)
Spoke  With Dr. Shawnie PonsPratt. Pt is a G1P0 at 35 6/[redacted] weeks gestation with N&V x 24 hrs. FHR tracing is a category 1 with UC's every 2-4-7 min. Pt is not feeling uc's per [facility] RN. PT has received only 1/2 her bag of IVF. Will let them know that they can contact [facility] to check pt's cervix.

## 2017-03-25 NOTE — ED Provider Notes (Signed)
[facility] Provider Note   CSN: [id number] Arrival date & time: 03/25/17  [id number]     History   Chief Complaint Chief Complaint  Patient presents with  . 36wkspreg/emesis    HPI Roberta HatchetJacqueline M Baker is a 31 y.o. female.  Patient complains of nausea and vomiting since last night. Patient is [redacted] weeks pregnant. She does not complain of any pain but occasionally had some pressure in her abdomen   The history is provided by the patient.  Emesis   This is a new problem. The problem occurs 2 to 4 times per day. The problem has not changed since onset.The emesis has an appearance of stomach contents. There has been no fever. Pertinent negatives include no abdominal pain, no chills, no cough, no diarrhea and no headaches. Risk factors include suspect food intake.    Past Medical History:  Diagnosis Date  . Anxiety   . Headache   . Infection    UTI  . Kidney infection   . Kidney stones   . Nausea and vomiting in pregnancy 02/07/2017  . Pain, dental 02/07/2017  . 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 IUGR (intrauterine growth restriction) affecting care of mother 03/14/2017  . 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 Pain, dental 02/07/2017  . Supervision of high risk pregnancy, antepartum 12/12/2016  . Late and limited prenatal care 12/12/2016  . 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 Depression with anxiety 12/12/2016    Past Surgical History:  Procedure Laterality Date  . BREAST ENHANCEMENT SURGERY    . BREAST SURGERY      OB History    Gravida Para Term Preterm AB Living   1       0 0   SAB TAB Ectopic Multiple Live Births                   Home Medications    Prior to Admission medications   Medication Sig Start Date End Date Taking? Authorizing Provider  acetaminophen (TYLENOL) 500 MG tablet Take 500 mg by mouth every 6 (six) hours as needed for moderate pain.   Yes [provider]  acyclovir  (ZOVIRAX) 400 MG tablet Take 1 tablet (400 mg total) by mouth 3 (three) times daily. 03/12/17  Yes Cheral MarkerBooker, Kimberly R, CNM  escitalopram (LEXAPRO) 10 MG tablet Take 1 tablet (10 mg total) by mouth daily. 03/12/17  Yes Cheral MarkerBooker, Kimberly R, CNM  flintstones complete (FLINTSTONES) 60 MG chewable tablet Chew 2 tablets by mouth daily.    Yes [provider]  omeprazole (PRILOSEC) 20 MG capsule Take 1 capsule (20 mg total) by mouth daily. 1 tablet a day 03/21/17  Yes Eure, Amaryllis DykeLuther H, MD  promethazine (PHENERGAN) 12.5 MG tablet Take 1 tablet (12.5 mg total) by mouth every 6 (six) hours as needed for nausea or vomiting. 03/18/17  Yes Tilda BurrowFerguson, John V, MD  ranitidine (ZANTAC) 150 MG tablet Take 1 tablet (150 mg total) by mouth 2 (two) times daily. Patient taking differently: Take 150 mg by mouth daily as needed for heartburn.  03/05/17  Yes Judeth HornLawrence, Erin, NP    Family History Family History  Problem Relation Age of Onset  . Mental illness Mother   . Cancer Maternal Uncle   . Cancer Maternal Grandmother        breast cancer  . Heart attack Maternal Grandfather     Social History Social History  Substance Use Topics  . Smoking status: Current Every Day Smoker    Packs/day: 0.25    Years: 0.50    Types: Cigarettes  . Smokeless tobacco: Never Used  . Alcohol use No     Allergies   Sulfa antibiotics   Review of Systems Review of Systems  Constitutional: Negative for appetite change, chills and fatigue.  HENT: Negative for congestion, ear discharge and sinus pressure.   Eyes: Negative for discharge.  Respiratory: Negative for cough.   Cardiovascular: Negative for chest pain.  Gastrointestinal: Positive for vomiting. Negative for abdominal pain and diarrhea.  Genitourinary: Negative for frequency and hematuria.  Musculoskeletal: Negative for back pain.  Skin: Negative for rash.  Neurological: Negative for seizures and headaches.  Psychiatric/Behavioral: Negative for hallucinations.       Physical Exam Updated Vital Signs BP 110/66 (BP Location: Left Arm)   Pulse 82   Temp 97.7 F (36.5 C) (Oral)   Resp 18   Ht 5' (1.524 m)   Wt 57.2 kg (126 lb)   LMP 07/05/2016   SpO2 98%   BMI 24.61 kg/m   Physical Exam  Constitutional: She is oriented to person, place, and time. She appears well-developed.  HENT:  Head: Normocephalic.  Eyes: Conjunctivae and EOM are normal. No scleral icterus.  Neck: Neck supple. No thyromegaly present.  Cardiovascular: Normal rate and regular rhythm.  Exam reveals no gallop and no friction rub.   No murmur heard. Pulmonary/Chest: No stridor. She has no wheezes. She has no rales. She exhibits no tenderness.  Abdominal: She exhibits distension. There is no tenderness. There is no rebound.  Patient is obviously [redacted] week pregnant  Genitourinary:  Genitourinary Comments: Bimanual exam shows baby is vertex. There is 0% effacement. Cervix is 1 cm  Musculoskeletal: Normal range of motion. She exhibits no edema.  Lymphadenopathy:    She has no cervical adenopathy.  Neurological: She is oriented to person, place, and time. She exhibits normal muscle tone. Coordination normal.  Skin: No rash noted. No erythema.  Psychiatric: She has a normal mood and affect. Her behavior is normal.     ED Treatments / Results  Labs (all labs ordered are listed, but only abnormal results are displayed) Labs Reviewed  URINALYSIS, ROUTINE W REFLEX MICROSCOPIC - Abnormal; Notable for the following:       Result Value   pH 9.0 (*)    Ketones, ur 20 (*)    Protein, ur 30 (*)    Bacteria, UA RARE (*)    Squamous Epithelial / LPF 0-5 (*)    All other components within normal limits  I-STAT CHEM 8, ED - Abnormal; Notable for the following:    BUN <3 (*)    Creatinine, Ser 0.40 (*)    Calcium, Ion 1.03 (*)    Hemoglobin 10.2 (*)    HCT 30.0 (*)    All other components within normal limits    EKG  EKG Interpretation None       Radiology No results  found.  Procedures Procedures (including critical care time)  Medications Ordered in ED Medications  sodium chloride 0.9 % bolus 1,000 mL (0 mLs Intravenous Stopped 03/25/17 0936)  ondansetron (ZOFRAN) injection 4 mg (4 mg Intravenous Given 03/25/17 0815)     Initial Impression / Assessment and Plan / ED Course  I have reviewed the triage vital signs and the nursing notes.  Pertinent labs & imaging results that were available during my care of the patient were  reviewed by me and considered in my medical decision making (see chart for details).   the patient was observed for over 3 hours and she started having regular contractions every 2-3 minutes recheck of her cervix showed that she dilated to about 2 cm slightly larger than her first exam 2 hours prior. I spoke with the OB doctor at [facility] and it was decided to [facility] the [facility] to [facility]. Patient will be discharged and driven directly over to [facility].    Final Clinical Impressions(s) / ED Diagnoses   Final diagnoses:  Normal pregnancy in third trimester    New Prescriptions New Prescriptions   No medications on file     Bethann Berkshire, MD 03/25/17 1114

## 2017-03-25 NOTE — MAU Note (Addendum)
[facility]  +nausea +vomiting Started after taking new medication for anxiety.  +contractions  Denies LOF or VB. +FM  20G IV noted in R AC saline locked

## 2017-03-25 NOTE — Progress Notes (Signed)
Spoke with [facility] ED RN. Says pt is not feeling any pain or cramping. Says she has gotten 1/2 bag of IVF. Pt is contracting every 2-4-7 min. FHR tracing is a category 1 tracing, intermittent at times. Says she will adjust the FM. Will cont to observe If pt cont to have uc's after her IVF bolus, I will contact the OB attending.

## 2017-03-25 NOTE — MAU Provider Note (Signed)
History     CSN: [id number]  Arrival date and time: 03/25/17 1159   First Provider Initiated Contact with Patient 03/25/17 1231      Chief Complaint  Patient presents with  . Emesis  . Nausea  . Contractions   [name] is a 31 y.o. G1P0000 at 6337w6d who presents today as a transfer from [facility]. She was seen there for nausea/vomiting and contractions. She states that she was started on an anxiety medication yesterday, and it made her nauseous. She then started vomiting yesterday, and has vomited multiple times. She went to the [facility], and was noted to be having contractions. She was checked there and was initially 1cm, and then on recheck she was 2cm. She was transferred here for further management. She denies any VB or LOF.    Past Medical History:  Diagnosis Date  . Anxiety   . Headache   . Infection    UTI  . Kidney infection   . Kidney stones   . Nausea and vomiting in pregnancy 02/07/2017  . Pain, dental 02/07/2017  . 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 BREAST ENHANCEMENT SURGERY    . BREAST SURGERY      Family History  Problem Relation Age of Onset  . Mental illness Mother   . Cancer Maternal Uncle   . Cancer Maternal Grandmother        breast cancer  . Heart attack Maternal Grandfather     Social History  Substance Use Topics  . Smoking status: Current Every Day Smoker    Packs/day: 0.25    Years: 0.50    Types: Cigarettes  . Smokeless tobacco: Never Used  . Alcohol use No    Allergies:  Allergies  Allergen Reactions  . Sulfa Antibiotics Nausea And Vomiting    Prescriptions Prior to Admission  Medication Sig Dispense Refill Last Dose  . acetaminophen (TYLENOL) 500 MG tablet Take 500 mg by mouth every 6 (six) hours as needed for moderate pain.   Past Week at Unknown time  . acyclovir (ZOVIRAX) 400 MG tablet Take 1 tablet (400 mg total) by mouth 3 (three) times daily. 90 tablet  3 Past Week at Unknown time  . escitalopram (LEXAPRO) 10 MG tablet Take 1 tablet (10 mg total) by mouth daily. 30 tablet 6 03/24/2017 at Unknown time  . flintstones complete (FLINTSTONES) 60 MG chewable tablet Chew 2 tablets by mouth daily.    Past Week at Unknown time  . omeprazole (PRILOSEC) 20 MG capsule Take 1 capsule (20 mg total) by mouth daily. 1 tablet a day 30 capsule 6 Past Week at Unknown time  . promethazine (PHENERGAN) 12.5 MG tablet Take 1 tablet (12.5 mg total) by mouth every 6 (six) hours as needed for nausea or vomiting. 30 tablet 0 Past Week at Unknown time  . ranitidine (ZANTAC) 150 MG tablet Take 1 tablet (150 mg total) by mouth 2 (two) times daily. (Patient taking differently: Take 150 mg by mouth daily as needed for heartburn. ) 60 tablet 0 Past Week at Unknown time    Review of Systems  Constitutional: Negative for chills and fever.  Gastrointestinal: Positive for abdominal pain, nausea and vomiting.  Genitourinary: Negative for vaginal bleeding and vaginal discharge.   Physical Exam   Blood pressure 111/78, pulse 83, temperature 98.3 F (36.8 C), temperature source Oral, resp. rate 18, last menstrual period 07/05/2016, SpO2 98 %.  Physical Exam  Nursing note and vitals reviewed. Constitutional: She is oriented to person, place, and time. She appears well-developed and well-nourished. No distress.  HENT:  Head: Normocephalic.  Cardiovascular: Normal rate.   Respiratory: Effort normal.  GI: Soft. There is no tenderness. There is no rebound.  Neurological: She is alert and oriented to person, place, and time.  Skin: Skin is warm and dry.  Psychiatric: She has a normal mood and affect.   FHT: 140, moderate with 15x15 accels, no decels Toco: about every 3-4 mins   1231: cervix: 1/thick/-2/vtx 1525: cervix: 1/thick/-2/vtx MAU Course  Procedures  MDM  No change over several hours. Still with irreg contractions. Reassured patient that even though having  contractions she is not in labor as there has been no cervical change. Will DC home with RX for zofran ODT. Rest and hydrate at home. Return precautions reviewed.  Assessment and Plan   1. Threatened premature labor affecting pregnancy, less than 37 weeks in second trimester, antepartum   2. Nausea and vomiting during pregnancy   3. [redacted] weeks gestation of pregnancy    DC home Comfort measures reviewed  3rd Trimester precautions  PTL precautions  Fetal kick counts RX: zofran ODT #20 with 3 RF  Return to MAU as needed FU with OB as planned  Follow-up Information    [facility] Follow up.   Specialty:  Obstetrics and Gynecology Contact information: [address] [place] [address] [phone]           Thressa Sheller 03/25/2017, 12:34 PM

## 2017-03-25 NOTE — Progress Notes (Signed)
Spoke with AstronomerTiffany RN. Dr. Shawnie PonsPratt  wants the ED MD to recheck the pt's cervix in 2 hours to make sure her cervix has not changed. Says she will give him the information.

## 2017-03-25 NOTE — Discharge Instructions (Signed)
[facility],   do not ear or drink

## 2017-03-25 NOTE — ED Notes (Signed)
Pt on bedpan to void, pt is now feeling contraction, radiating into back, [facility] and update EDP. Plan to check cervix at 11

## 2017-03-25 NOTE — Progress Notes (Signed)
Lurena Joinerebecca RN notified that I have spoken to Dr. Shawnie PonsPratt and [facility] can come over there and check the pt's cervix.

## 2017-03-25 NOTE — ED Notes (Signed)
Roberta Baker called from [facility] with update, pt has received 200 ML of bolus, will continue to monitor. Pt denies pain, little pressure and tighten at times.

## 2017-03-25 NOTE — ED Notes (Signed)
In with EDP to check cervix, pt is now 2cm, and feeling contraction.

## 2017-03-25 NOTE — Progress Notes (Signed)
Spoke with Dr. Shawnie PonsPratt. Pt is 1cm, not effaced, presenting part vertex per ED MD. Pt cont to contract every 1.5-5. ED RN says pt describes them as tightening. No vaginal bleeding. Dr. Shawnie PonsPratt wants the ED MD to recheck the pt in 2 hours to make sure her cervix has not changed.

## 2017-03-25 NOTE — ED Notes (Signed)
EDP checking patient as per request of [facility], pt is no effaced and 1 cm dilated.

## 2017-03-25 NOTE — MAU Note (Signed)
Urine in lab 

## 2017-03-25 NOTE — Progress Notes (Signed)
Spoke with [facility] RN. Says ED MD checked pt's cervix and says she is 1cm dilated, and not effaced, presentation is vertex. Says he does [facility].

## 2017-03-25 NOTE — ED Notes (Signed)
Pt placed on toco and RN [facility] Candie [facility] Response [facility]

## 2017-03-25 NOTE — Progress Notes (Signed)
Dr. Shawnie PonsPratt on unit. Reviewing tracing.

## 2017-03-25 NOTE — Progress Notes (Signed)
Received call from [facility] RN Tiffany. Pt is a G1P0 at 35 6/[redacted] weeks gestation presenting with N&V for 24 hours. [facility] No vaginal bleeding or leaking of fluid.

## 2017-03-25 NOTE — ED Notes (Signed)
Pt states she has been shaking and nausea since starting new medication yesterday. States she is very sensitive to medication. Denies pain, pt and baby on  Monitor, vitals WNL

## 2017-03-25 NOTE — ED Notes (Signed)
[facility]. Per EDP leave IV in place, IV wrapped gauze and coban for transport

## 2017-03-26 ENCOUNTER — Other Ambulatory Visit: Payer: Self-pay | Admitting: Obstetrics & Gynecology

## 2017-03-26 ENCOUNTER — Encounter: Payer: Medicaid Other | Admitting: Obstetrics & Gynecology

## 2017-03-26 DIAGNOSIS — O365930 Maternal care for other known or suspected poor fetal growth, third trimester, not applicable or unspecified: Secondary | ICD-10-CM

## 2017-03-28 ENCOUNTER — Encounter: Payer: Self-pay | Admitting: Obstetrics and Gynecology

## 2017-03-28 ENCOUNTER — Ambulatory Visit (INDEPENDENT_AMBULATORY_CARE_PROVIDER_SITE_OTHER): Payer: Medicaid Other | Admitting: Obstetrics and Gynecology

## 2017-03-28 ENCOUNTER — Ambulatory Visit (INDEPENDENT_AMBULATORY_CARE_PROVIDER_SITE_OTHER): Payer: Medicaid Other

## 2017-03-28 VITALS — BP 90/52 | HR 100 | Wt 129.0 lb

## 2017-03-28 DIAGNOSIS — Z331 Pregnant state, incidental: Secondary | ICD-10-CM

## 2017-03-28 DIAGNOSIS — F418 Other specified anxiety disorders: Secondary | ICD-10-CM

## 2017-03-28 DIAGNOSIS — Z1389 Encounter for screening for other disorder: Secondary | ICD-10-CM | POA: Diagnosis not present

## 2017-03-28 DIAGNOSIS — Z3A36 36 weeks gestation of pregnancy: Secondary | ICD-10-CM

## 2017-03-28 DIAGNOSIS — O0932 Supervision of pregnancy with insufficient antenatal care, second trimester: Secondary | ICD-10-CM

## 2017-03-28 DIAGNOSIS — O0990 Supervision of high risk pregnancy, unspecified, unspecified trimester: Secondary | ICD-10-CM

## 2017-03-28 DIAGNOSIS — K0889 Other specified disorders of teeth and supporting structures: Secondary | ICD-10-CM

## 2017-03-28 DIAGNOSIS — O09293 Supervision of pregnancy with other poor reproductive or obstetric history, third trimester: Secondary | ICD-10-CM

## 2017-03-28 DIAGNOSIS — O365930 Maternal care for other known or suspected poor fetal growth, third trimester, not applicable or unspecified: Secondary | ICD-10-CM

## 2017-03-28 DIAGNOSIS — Z3403 Encounter for supervision of normal first pregnancy, third trimester: Secondary | ICD-10-CM

## 2017-03-28 DIAGNOSIS — O0993 Supervision of high risk pregnancy, unspecified, third trimester: Secondary | ICD-10-CM

## 2017-03-28 DIAGNOSIS — B009 Herpesviral infection, unspecified: Secondary | ICD-10-CM

## 2017-03-28 LAB — POCT URINALYSIS DIPSTICK
Blood, UA: NEGATIVE
KETONES UA: NEGATIVE
Nitrite, UA: NEGATIVE
Protein, UA: NEGATIVE

## 2017-03-28 LAB — OB RESULTS CONSOLE GBS: STREP GROUP B AG: NEGATIVE

## 2017-03-28 NOTE — Progress Notes (Signed)
US 36+2 wks,cephalic,ant pl gr 2,BPP 8/8,AFI 11.4 cm,fhr 152 bpm,RI .66,.64=83%

## 2017-03-28 NOTE — Progress Notes (Addendum)
Geraldine SolarJacqueline M Bohac is a 31 y.o. female High Risk Pregnancy Diagnosis(es):   FGR IUGR BASED ON NARROW AC LESS than 10th percentile  G1P0000 7572w2d Estimated Date of Delivery: 04/23/17  Blood pressure (!) 90/52, pulse 100, weight 129 lb (58.5 kg), last menstrual period 07/05/2016.  Urinalysis: trace glucose, 1+ leukocytes   HPI: Pt has no complaints today. She was seen in the ED/MAU for contractions and 1 cm   BP weight and urine results all reviewed and noted. Patient reports good fetal movement, denies any bleeding and no rupture of membranes symptoms or regular contractions.  Fundal Height:  33 cm Fetal Heart rate:  152 bpm Edema:  None Ultrasound done, QUALITY OF SCAN:satisfactory  TECHNICIAN COMMENTS:  US 36+2 wks,cephalic,ant pl gr 2,BPP 8/8,AFI 11.4 cm,fhr 152 bpm,RI .66,.64=83%,S/D 76% 2.85    A copy of this report including all images has been saved and backed up to a second source for retrieval if needed. All measures and details of the anatomical scan, placentation, fluid volume and pelvic anatomy are contained in that report.  Amber Flora LippsJ Carl 03/28/2017  Patient is without complaints. All questions were answered.  Assessment:  772w2d, Estimated Date of Delivery: 04/23/17  Medication(s) Plans:  Acyclovir 3 times daily  Treatment Plan:  Continue twice weekly surveillance deliver 37-39 weeks as clinically indicated  Follow up in 4 days for OB appt, NST, HROB We will obtain growth ultrasound every 3 weeks   By signing my name below, I, [name], attest that this documentation has been prepared under the direction and in the presence of Tilda BurrowFerguson, John V, MD. Electronically Signed: Redge GainerIzna Ahmed, ED Scribe. 03/28/17. 2:41 PM.  I personally performed the services described in this documentation, which was SCRIBED in my presence. The recorded information has been reviewed and considered accurate. It has been edited as necessary during review. Tilda BurrowFERGUSON,JOHN V,  MD

## 2017-03-30 LAB — GC/CHLAMYDIA PROBE AMP
CHLAMYDIA, DNA PROBE: NEGATIVE
Neisseria gonorrhoeae by PCR: NEGATIVE

## 2017-04-01 ENCOUNTER — Other Ambulatory Visit: Payer: Medicaid Other | Admitting: Women's Health

## 2017-04-02 ENCOUNTER — Encounter: Payer: Self-pay | Admitting: Advanced Practice Midwife

## 2017-04-02 ENCOUNTER — Ambulatory Visit (INDEPENDENT_AMBULATORY_CARE_PROVIDER_SITE_OTHER): Payer: Medicaid Other | Admitting: Advanced Practice Midwife

## 2017-04-02 VITALS — BP 108/46 | HR 94 | Wt 127.0 lb

## 2017-04-02 DIAGNOSIS — O09893 Supervision of other high risk pregnancies, third trimester: Secondary | ICD-10-CM | POA: Diagnosis not present

## 2017-04-02 DIAGNOSIS — O365990 Maternal care for other known or suspected poor fetal growth, unspecified trimester, not applicable or unspecified: Secondary | ICD-10-CM

## 2017-04-02 DIAGNOSIS — O0990 Supervision of high risk pregnancy, unspecified, unspecified trimester: Secondary | ICD-10-CM

## 2017-04-02 DIAGNOSIS — O365930 Maternal care for other known or suspected poor fetal growth, third trimester, not applicable or unspecified: Secondary | ICD-10-CM | POA: Diagnosis not present

## 2017-04-02 DIAGNOSIS — Z3A37 37 weeks gestation of pregnancy: Secondary | ICD-10-CM | POA: Diagnosis not present

## 2017-04-02 DIAGNOSIS — Z331 Pregnant state, incidental: Secondary | ICD-10-CM | POA: Diagnosis not present

## 2017-04-02 DIAGNOSIS — Z1389 Encounter for screening for other disorder: Secondary | ICD-10-CM

## 2017-04-02 LAB — POCT URINALYSIS DIPSTICK
GLUCOSE UA: NEGATIVE
KETONES UA: NEGATIVE
Leukocytes, UA: NEGATIVE
Nitrite, UA: NEGATIVE

## 2017-04-02 LAB — CULTURE, BETA STREP (GROUP B ONLY): Strep Gp B Culture: NEGATIVE

## 2017-04-02 NOTE — Progress Notes (Addendum)
Fetal Surveillance Testing today:  NST   High Risk Pregnancy Diagnosis(es):    FGR IUGR BASED ON NARROW AC LESS than 10th percentile  G1P0000 8044w0d Estimated Date of Delivery: 04/23/17  Blood pressure (!) 108/46, pulse 94, weight 127 lb (57.6 kg), last menstrual period 07/05/2016.  Urinalysis: Negative   HPI: The patient is being seen today for ongoing management of the abovea. Today she reports `no complaints  BP weight and urine results all reviewed and noted. Patient reports good fetal movement, denies any bleeding and no rupture of membranes symptoms or regular contractions.   Fetal Heart rate:  145 Edema:  no  Patient is without complaints other than noted in her HPI. All questions were answered.  All lab and sonogram results have been reviewed. Comments:  NST reactive, accels _+, mod variability  Assessment:  1.  Pregnancy at 6944w0d,  Estimated Date of Delivery: 04/23/17 :                          2.  FGR (low AC)                        3.    Medication(s) Plans:  Acyclovir 400mg  TID  Treatment Plan:  Growth US/BPP Friday  Deliver 38-ish weeks or as clinically indicated  Return in about 3 days (around 04/05/2017) for growth US/HROB. for appointment for high risk OB care  No orders of the defined types were placed in this encounter.  Orders Placed This Encounter  Procedures  . POCT Urinalysis Dipstick

## 2017-04-02 NOTE — Patient Instructions (Addendum)
AM I IN LABOR? What is labor? Labor is the work that your body does to birth your baby. Your uterus (the womb) contracts. Your cervix (the mouth of the uterus) opens. You will push your baby out into the world.  What do contractions (labor pains) feel like? When they first start, contractions usually feel like cramps during your period. Sometimes you feel pain in your back. Most often, contractions feel like muscles pulling painfully in your lower belly. At first, the contractions will probably be 15 to 20 minutes apart. They will not feel too painful. As labor goes on, the contractions get stronger, closer together, and more painful.  How do I time the contractions? Time your contractions by counting the number of minutes from the start of one contraction to the start of the next contraction.  What should I do when the contractions start? If it is night and you can sleep, sleep. If it happens during the day, here are some things you can do to take care of yourself at home: ? Walk. If the pains you are having are real labor, walking will make the contractions come faster and harder. If the contractions are not going to continue and be real labor, walking will make the contractions slow down. ? Take a shower or bath. This will help you relax. ? Eat. Labor is a big event. It takes a lot of energy. ? Drink water. Not drinking enough water can cause false labor (contractions that hurt but do not open your cervix). If this is true labor, drinking water will help you have strength to get through your labor. ? Take a nap. Get all the rest you can. ? Get a massage. If your labor is in your back, a strong massage on your lower back may feel very good. Getting a foot massage is always good. ? Don't panic. You can do this. Your body was made for this. You are strong!  When should I go to the hospital or call my health care provider? ? Your contractions have been 5 minutes apart or less for at least 1  hour. ? If several contractions are so painful you cannot walk or talk during one. ? Your bag of waters breaks. (You may have a big gush of water or just water that runs down your legs when you walk.)  Are there other reasons to call my health care provider? Yes, you should call your health care provider or go to the hospital if you start to bleed like you are having a period- blood that soaks your underwear or runs down your legs, if you have sudden severe pain, if your baby has not moved for several hours, or if you are leaking green fluid. The rule is as follows: If you are very concerned about something, call.   Sidney Ace.[place] Pediatricians/Family Doctors:  [facility] [phone]

## 2017-04-03 ENCOUNTER — Other Ambulatory Visit: Payer: Self-pay | Admitting: Advanced Practice Midwife

## 2017-04-03 DIAGNOSIS — O365931 Maternal care for other known or suspected poor fetal growth, third trimester, fetus 1: Secondary | ICD-10-CM

## 2017-04-04 ENCOUNTER — Ambulatory Visit (INDEPENDENT_AMBULATORY_CARE_PROVIDER_SITE_OTHER): Payer: Medicaid Other

## 2017-04-04 ENCOUNTER — Ambulatory Visit (INDEPENDENT_AMBULATORY_CARE_PROVIDER_SITE_OTHER): Payer: Medicaid Other | Admitting: Obstetrics and Gynecology

## 2017-04-04 VITALS — BP 96/68 | HR 88 | Wt 125.4 lb

## 2017-04-04 DIAGNOSIS — O0990 Supervision of high risk pregnancy, unspecified, unspecified trimester: Secondary | ICD-10-CM

## 2017-04-04 DIAGNOSIS — Z331 Pregnant state, incidental: Secondary | ICD-10-CM

## 2017-04-04 DIAGNOSIS — O0932 Supervision of pregnancy with insufficient antenatal care, second trimester: Secondary | ICD-10-CM

## 2017-04-04 DIAGNOSIS — Z1389 Encounter for screening for other disorder: Secondary | ICD-10-CM

## 2017-04-04 DIAGNOSIS — O365931 Maternal care for other known or suspected poor fetal growth, third trimester, fetus 1: Secondary | ICD-10-CM

## 2017-04-04 DIAGNOSIS — O365930 Maternal care for other known or suspected poor fetal growth, third trimester, not applicable or unspecified: Secondary | ICD-10-CM

## 2017-04-04 DIAGNOSIS — O09893 Supervision of other high risk pregnancies, third trimester: Secondary | ICD-10-CM

## 2017-04-04 DIAGNOSIS — Z3A37 37 weeks gestation of pregnancy: Secondary | ICD-10-CM

## 2017-04-04 DIAGNOSIS — K0889 Other specified disorders of teeth and supporting structures: Secondary | ICD-10-CM

## 2017-04-04 DIAGNOSIS — F418 Other specified anxiety disorders: Secondary | ICD-10-CM

## 2017-04-04 DIAGNOSIS — B009 Herpesviral infection, unspecified: Secondary | ICD-10-CM

## 2017-04-04 LAB — POCT URINALYSIS DIPSTICK
Blood, UA: NEGATIVE
Glucose, UA: NEGATIVE
KETONES UA: NEGATIVE
LEUKOCYTES UA: NEGATIVE
Nitrite, UA: NEGATIVE
PROTEIN UA: NEGATIVE

## 2017-04-04 NOTE — Progress Notes (Addendum)
Roberta SolarJacqueline M Parlow is a 31 y.o. female High Risk Pregnancy HROB Diagnosis(es):   FGR IUGR BASED ON NARROW ACLESS than 10th percentile  G1P0000 9333w2d Estimated Date of Delivery: 04/23/17    HPI: The patient is being seen today for ongoing management of FGR IUGR BASED ON NARROW ACLESS than 10th percentile. Today she reports no complaints Patient reports good fetal movement, denies any bleeding and no rupture of membranes symptoms or regular contractions.   BP weight and urine results reviewed and noted. Last menstrual period 07/05/2016.  Fundal Height:  32 cm Fetal Heart rate:  129 bpm Physical Examination: Abdomen - soft, nontender, nondistended, no masses or organomegaly                                     Pelvic - not indicated                                     Edema:  none  Urinalysis:  negative  Fetal Surveillance Testing today:  BPP              BIPARIETAL DIAMETER           8.7 cm         35 weeks   12.6%  HEAD CIRCUMFERENCE           32.10 cm         36+1 weeks   8.4%  ABDOMINAL CIRCUMFERENCE           29.89 cm         33+6 weeks   1.3%  FEMUR LENGTH           6.69 cm         34+2 weeks    2.3%                                                           AVERAGE EGA(BY THIS SCAN):  34+5 weeks                                                 ESTIMATED FETAL WEIGHT:       2415  grams, 6.9 %   BIOPHYSCIAL PROFILE:                                                                                                      COMMENTS GROSS BODY MOVEMENT                 2   TONE  2   RESPIRATIONS                2   AMNIOTIC FLUID                2                                                            SCORE:  8/8 (Note: NST was not performed as part of this antepartum testing)  DOPPLER FLOW STUDIES: UMBILICAL ARTERY RI RATIOS:   0.63, 0.66=84% S/D 2.8=78%   Lab and sonogram results have been reviewed.   Assessment:  1.   Pregnancy at 6564w2d,  G1P0000   :  Estimated Date of Delivery: 04/23/17                        2.  HROB: FGR IUGR BASED ON NARROW ACLESS than 10th percentile                         3.hx HSV II  Medication(s) Plans:  Acyclovir 400mg  TID  Treatment Plan:  Growth US/BPP Monday, Deliver 39-ish weeks or as clinically indicated  Follow up in 4 days for appointment for high risk OB care, growth U/S, FGR (low AC)   By signing my name below, I, Roberta Baker, attest that this documentation has been prepared under the direction and in the presence of Tilda BurrowFerguson, Vickey Boak V, MD. Electronically Signed: Redge GainerIzna Baker, Medical Scribe. 04/04/17. 3:41 PM.  I personally performed the services described in this documentation, which was SCRIBED in my presence. The recorded information has been reviewed and considered accurate. It has been edited as necessary during review. Tilda BurrowFERGUSON,Dea Bitting V, MD

## 2017-04-04 NOTE — Progress Notes (Signed)
US 37+2 wks,cephalic,fhr 129 bpm,ant pl gr 2,AFI 12 cm,RI .62,.66=84% s/d 2.8=77.7%,efw 2415 g 6.9%,HC 8.4 % AC 1.3%,FL 2.3%,BPP 8/8

## 2017-04-08 ENCOUNTER — Ambulatory Visit (INDEPENDENT_AMBULATORY_CARE_PROVIDER_SITE_OTHER): Payer: Medicaid Other | Admitting: Obstetrics and Gynecology

## 2017-04-08 ENCOUNTER — Encounter: Payer: Self-pay | Admitting: Obstetrics and Gynecology

## 2017-04-08 VITALS — BP 102/60 | HR 98 | Wt 127.6 lb

## 2017-04-08 DIAGNOSIS — Z331 Pregnant state, incidental: Secondary | ICD-10-CM | POA: Diagnosis not present

## 2017-04-08 DIAGNOSIS — O09893 Supervision of other high risk pregnancies, third trimester: Secondary | ICD-10-CM

## 2017-04-08 DIAGNOSIS — Z3A37 37 weeks gestation of pregnancy: Secondary | ICD-10-CM

## 2017-04-08 DIAGNOSIS — Z1389 Encounter for screening for other disorder: Secondary | ICD-10-CM | POA: Diagnosis not present

## 2017-04-08 DIAGNOSIS — O365930 Maternal care for other known or suspected poor fetal growth, third trimester, not applicable or unspecified: Secondary | ICD-10-CM | POA: Diagnosis not present

## 2017-04-08 DIAGNOSIS — O0990 Supervision of high risk pregnancy, unspecified, unspecified trimester: Secondary | ICD-10-CM

## 2017-04-08 LAB — POCT URINALYSIS DIPSTICK
Glucose, UA: NEGATIVE
Ketones, UA: NEGATIVE
NITRITE UA: NEGATIVE

## 2017-04-08 NOTE — Progress Notes (Signed)
.   High Risk Pregnancy HROB Diagnosis(es):   IUGR infant.   G1P0000 5780w6d Estimated Date of Delivery: 04/23/17    HPI: The patient is being seen today for ongoing management of SGA/IUGR with EFW at 37w2 of 2412 gm with AC <3 %ile, and weight <10%ile.. Today she reports that she's so big she thinks she'll burst. She feels continued growth. Patient reports good fetal movement, denies any bleeding and no rupture of membranes symptoms or regular contractions. + FM  BP weight and urine results reviewed and noted. Blood pressure 102/60, pulse 98, weight 127 lb 9.6 oz (57.9 kg), last menstrual period 07/05/2016.  Fundal Height:  32 Fetal Heart rate:  140 Physical Examination: Abdomen - soft, nontender, nondistended, no masses or organomegaly                                     Pelvic - normal external genitalia, vulva, vagina, cervix, uterus and adnexa                                     Edema:  -   Urinalysis:NEGATIVE for protein                   Fetal Surveillance Testing today:  NST reactive 140 with accels.  Lab and sonogram results have been reviewed. Comments: normal   Assessment:  1.  Pregnancy at 5780w6d,  G1P0000   :  IUGR                         2.  Reactive NST                        3. Mother perceives continued growth  Medication(s) Plans:  none  Treatment Plan:  NST   Follow up in 4d weeks for appointment for high risk OB care,

## 2017-04-10 ENCOUNTER — Other Ambulatory Visit: Payer: Self-pay | Admitting: Obstetrics and Gynecology

## 2017-04-10 DIAGNOSIS — O365931 Maternal care for other known or suspected poor fetal growth, third trimester, fetus 1: Secondary | ICD-10-CM

## 2017-04-11 ENCOUNTER — Other Ambulatory Visit: Payer: Medicaid Other

## 2017-04-11 ENCOUNTER — Encounter: Payer: Self-pay | Admitting: Women's Health

## 2017-04-11 ENCOUNTER — Ambulatory Visit (INDEPENDENT_AMBULATORY_CARE_PROVIDER_SITE_OTHER): Payer: Medicaid Other

## 2017-04-11 ENCOUNTER — Encounter: Payer: Medicaid Other | Admitting: Women's Health

## 2017-04-11 ENCOUNTER — Ambulatory Visit (INDEPENDENT_AMBULATORY_CARE_PROVIDER_SITE_OTHER): Payer: Medicaid Other | Admitting: Women's Health

## 2017-04-11 ENCOUNTER — Telehealth: Payer: Self-pay | Admitting: Obstetrics & Gynecology

## 2017-04-11 VITALS — BP 104/60 | HR 76 | Wt 124.2 lb

## 2017-04-11 DIAGNOSIS — O09893 Supervision of other high risk pregnancies, third trimester: Secondary | ICD-10-CM

## 2017-04-11 DIAGNOSIS — Z1389 Encounter for screening for other disorder: Secondary | ICD-10-CM

## 2017-04-11 DIAGNOSIS — Z3A38 38 weeks gestation of pregnancy: Secondary | ICD-10-CM | POA: Diagnosis not present

## 2017-04-11 DIAGNOSIS — O365930 Maternal care for other known or suspected poor fetal growth, third trimester, not applicable or unspecified: Secondary | ICD-10-CM

## 2017-04-11 DIAGNOSIS — B009 Herpesviral infection, unspecified: Secondary | ICD-10-CM

## 2017-04-11 DIAGNOSIS — O0990 Supervision of high risk pregnancy, unspecified, unspecified trimester: Secondary | ICD-10-CM

## 2017-04-11 DIAGNOSIS — O365990 Maternal care for other known or suspected poor fetal growth, unspecified trimester, not applicable or unspecified: Secondary | ICD-10-CM

## 2017-04-11 DIAGNOSIS — O365931 Maternal care for other known or suspected poor fetal growth, third trimester, fetus 1: Secondary | ICD-10-CM

## 2017-04-11 DIAGNOSIS — O0932 Supervision of pregnancy with insufficient antenatal care, second trimester: Secondary | ICD-10-CM

## 2017-04-11 DIAGNOSIS — Z331 Pregnant state, incidental: Secondary | ICD-10-CM

## 2017-04-11 DIAGNOSIS — F418 Other specified anxiety disorders: Secondary | ICD-10-CM

## 2017-04-11 DIAGNOSIS — O0993 Supervision of high risk pregnancy, unspecified, third trimester: Secondary | ICD-10-CM

## 2017-04-11 DIAGNOSIS — K0889 Other specified disorders of teeth and supporting structures: Secondary | ICD-10-CM

## 2017-04-11 LAB — POCT URINALYSIS DIPSTICK
Blood, UA: NEGATIVE
Glucose, UA: NEGATIVE
Ketones, UA: NEGATIVE
Nitrite, UA: NEGATIVE
PROTEIN UA: NEGATIVE

## 2017-04-11 NOTE — Patient Instructions (Signed)
Your induction is scheduled for Monday 8/13 @ 11:45pm. Go to [facility] hospital, Maternity Admissions Unit (Emergency) entrance and let them know you are there to be induced. They will send someone from Labor & Delivery to come get you.    Call the office [phone]) or go to [facility] if:  You begin to have strong, frequent contractions  Your water breaks.  Sometimes it is a big gush of fluid, sometimes it is just a trickle that keeps getting your panties wet or running down your legs  You have vaginal bleeding.  It is normal to have a small amount of spotting if your cervix was checked.   You don't feel your baby moving like normal.  If you don't, get you something to eat and drink and lay down and focus on feeling your baby move.  You should feel at least 10 movements in 2 hours.  If you don't, you should call the office or go to [facility].     [facility] Contractions Contractions of the uterus can occur throughout pregnancy, but they are not always a sign that you are in labor. You may have practice contractions called Braxton Hicks contractions. These false labor contractions are sometimes confused with true labor. What are Deberah Pelton contractions? Braxton Hicks contractions are tightening movements that occur in the muscles of the uterus before labor. Unlike true labor contractions, these contractions do not result in opening (dilation) and thinning of the cervix. Toward the end of pregnancy (32-34 weeks), Braxton Hicks contractions can happen more often and may become stronger. These contractions are sometimes difficult to tell apart from true labor because they can be very uncomfortable. You should not feel embarrassed if you go to the hospital with false labor. Sometimes, the only way to tell if you are in true labor is for your health care provider to look for changes in the cervix. The health care provider will do a physical exam and may monitor your contractions. If you  are not in true labor, the exam should show that your cervix is not dilating and your water has not broken. If there are no prenatal problems or other health problems associated with your pregnancy, it is completely safe for you to be sent home with false labor. You may continue to have Braxton Hicks contractions until you go into true labor. How can I tell the difference between true labor and false labor?  Differences ? False labor ? Contractions last 30-70 seconds.: Contractions are usually shorter and not as strong as true labor contractions. ? Contractions become very regular.: Contractions are usually irregular. ? Discomfort is usually felt in the top of the uterus, and it spreads to the lower abdomen and low back.: Contractions are often felt in the front of the lower abdomen and in the groin. ? Contractions do not go away with walking.: Contractions may go away when you walk around or change positions while lying down. ? Contractions usually become more intense and increase in frequency.: Contractions get weaker and are shorter-lasting as time goes on. ? The cervix dilates and gets thinner.: The cervix usually does not dilate or become thin. Follow these instructions at home:  Take over-the-counter and prescription medicines only as told by your health care provider.  Keep up with your usual exercises and follow other instructions from your health care provider.  Eat and drink lightly if you think you are going into labor.  If Braxton Hicks contractions are making you uncomfortable: ?  Change your position from lying down or resting to walking, or change from walking to resting. ? Sit and rest in a tub of warm water. ? Drink enough fluid to keep your urine clear or pale yellow. Dehydration may cause these contractions. ? Do slow and deep breathing several times an hour.  Keep all follow-up prenatal visits as told by your health care provider. This is important. Contact a health care  provider if:  You have a fever.  You have continuous pain in your abdomen. Get help right away if:  Your contractions become stronger, more regular, and closer together.  You have fluid leaking or gushing from your vagina.  You pass blood-tinged mucus (bloody show).  You have bleeding from your vagina.  You have low back pain that you never had before.  You feel your baby's head pushing down and causing pelvic pressure.  Your baby is not moving inside you as much as it used to. Summary  Contractions that occur before labor are called Braxton Hicks contractions, false labor, or practice contractions.  Braxton Hicks contractions are usually shorter, weaker, farther apart, and less regular than true labor contractions. True labor contractions usually become progressively stronger and regular and they become more frequent.  Manage discomfort from [facility] contractions by changing position, resting in a warm bath, drinking plenty of water, or practicing deep breathing. This information is not intended to replace advice given to you by your health care provider. Make sure you discuss any questions you have with your health care provider. Document Released: 08/20/2005 Document Revised: 07/09/2016 Document Reviewed: 07/09/2016 Elsevier Interactive Patient Education  2017 [employer]

## 2017-04-11 NOTE — Telephone Encounter (Signed)
Patient called stating that she can not come to her appointment today because she has no ride. Pt want to come in tomorrow but there is no availability. Please contact pt

## 2017-04-11 NOTE — Progress Notes (Signed)
High Risk Pregnancy Diagnosis(es): IUGR G1P0000 603w2d Estimated Date of Delivery: 04/23/17 BP 104/60   Pulse 76   Wt 124 lb 3.2 oz (56.3 kg)   LMP 07/05/2016   BMI 24.26 kg/m   Urinalysis: Positive for mod leuks HPI:  Doing well BP, weight, and urine reviewed.  Reports good fm. Denies regular uc's, lof, vb, uti s/s. No complaints.  Fundal Height:  30 Fetal Heart rate:  122 u/s Edema:  None SVE per request: 1.5/50/-2, vtx  Reviewed today's u/s: bpp 8/8, dopp 75%, vtx, afi low-normal 7.9cm All questions were answered Assessment: 783w2d IUGR Medication(s) Plans:  Acyclovir for HSV2 suppression Treatment Plan:  IOL @ 39wks, scheduled for 8/14 @ [place], IOL form faxed in EPIC and orders placed Follow up on Monday for high-risk OB appt and NST

## 2017-04-11 NOTE — Treatment Plan (Signed)
   Induction Assessment Scheduling Form: Fax to [facility]:  [phone]  Roberta SolarJacqueline M Baker                                                                                   DOB:  June 02, 1986                                                            MRN:  [id number]                                                                     Phone #:   [phone]                         Provider:  [facility]  GP:  G1P0000                                                            Estimated Date of Delivery: 04/23/17  Dating Criteria: 18wk u/s    Medical Indications for induction:  IUGR Admission Date/Time:  8/14 @ [facility] Gestational age on admission:  39.0   Filed Weights   04/11/17 1619  Weight: 124 lb 3.2 oz (56.3 kg)   HIV:   neg GBS:  neg  1.5/50/-2, vtx   Method of induction(proposed):  Foley bulb   Scheduling Provider Signature:  Marge DuncansBooker, Zully Frane Randall, [place]                                            Today's Date:  04/11/2017

## 2017-04-11 NOTE — Progress Notes (Signed)
US 38+2 wks,cephalic,fhr 122 bpm,BPP 8/8,anterior pl gr 3,afi 7.9 cm,RI .60,.61=75%

## 2017-04-12 ENCOUNTER — Telehealth (HOSPITAL_COMMUNITY): Payer: Self-pay | Admitting: *Deleted

## 2017-04-12 ENCOUNTER — Encounter (HOSPITAL_COMMUNITY): Payer: Self-pay | Admitting: *Deleted

## 2017-04-12 NOTE — Telephone Encounter (Signed)
Preadmission screen  

## 2017-04-15 ENCOUNTER — Ambulatory Visit (INDEPENDENT_AMBULATORY_CARE_PROVIDER_SITE_OTHER): Payer: Medicaid Other | Admitting: Obstetrics and Gynecology

## 2017-04-15 DIAGNOSIS — Z5329 Procedure and treatment not carried out because of patient's decision for other reasons: Secondary | ICD-10-CM

## 2017-04-15 DIAGNOSIS — Z91199 Patient's noncompliance with other medical treatment and regimen due to unspecified reason: Secondary | ICD-10-CM

## 2017-04-16 ENCOUNTER — Encounter (HOSPITAL_COMMUNITY): Payer: Self-pay

## 2017-04-16 ENCOUNTER — Inpatient Hospital Stay (HOSPITAL_COMMUNITY): Payer: Medicaid Other | Admitting: Anesthesiology

## 2017-04-16 ENCOUNTER — Inpatient Hospital Stay (HOSPITAL_COMMUNITY)
Admission: RE | Admit: 2017-04-16 | Discharge: 2017-04-19 | DRG: 774 | Disposition: A | Discharge status: Home / Self Care | Payer: Medicaid Other | Source: Ambulatory Visit | Attending: Family Medicine | Admitting: Family Medicine

## 2017-04-16 DIAGNOSIS — F1290 Cannabis use, unspecified, uncomplicated: Secondary | ICD-10-CM | POA: Diagnosis present

## 2017-04-16 DIAGNOSIS — Z87891 Personal history of nicotine dependence: Secondary | ICD-10-CM | POA: Diagnosis not present

## 2017-04-16 DIAGNOSIS — O9832 Other infections with a predominantly sexual mode of transmission complicating childbirth: Secondary | ICD-10-CM | POA: Diagnosis present

## 2017-04-16 DIAGNOSIS — F329 Major depressive disorder, single episode, unspecified: Secondary | ICD-10-CM | POA: Diagnosis present

## 2017-04-16 DIAGNOSIS — O99344 Other mental disorders complicating childbirth: Secondary | ICD-10-CM | POA: Diagnosis present

## 2017-04-16 DIAGNOSIS — F418 Other specified anxiety disorders: Secondary | ICD-10-CM

## 2017-04-16 DIAGNOSIS — Z3A39 39 weeks gestation of pregnancy: Secondary | ICD-10-CM

## 2017-04-16 DIAGNOSIS — O99324 Drug use complicating childbirth: Secondary | ICD-10-CM | POA: Diagnosis present

## 2017-04-16 DIAGNOSIS — B009 Herpesviral infection, unspecified: Secondary | ICD-10-CM

## 2017-04-16 DIAGNOSIS — O365930 Maternal care for other known or suspected poor fetal growth, third trimester, not applicable or unspecified: Principal | ICD-10-CM | POA: Diagnosis present

## 2017-04-16 DIAGNOSIS — D649 Anemia, unspecified: Secondary | ICD-10-CM | POA: Diagnosis present

## 2017-04-16 DIAGNOSIS — O9902 Anemia complicating childbirth: Secondary | ICD-10-CM | POA: Diagnosis present

## 2017-04-16 DIAGNOSIS — Z8759 Personal history of other complications of pregnancy, childbirth and the puerperium: Secondary | ICD-10-CM | POA: Diagnosis present

## 2017-04-16 DIAGNOSIS — O0990 Supervision of high risk pregnancy, unspecified, unspecified trimester: Secondary | ICD-10-CM

## 2017-04-16 DIAGNOSIS — K0889 Other specified disorders of teeth and supporting structures: Secondary | ICD-10-CM

## 2017-04-16 DIAGNOSIS — F419 Anxiety disorder, unspecified: Secondary | ICD-10-CM | POA: Diagnosis present

## 2017-04-16 DIAGNOSIS — O0932 Supervision of pregnancy with insufficient antenatal care, second trimester: Secondary | ICD-10-CM

## 2017-04-16 DIAGNOSIS — A6000 Herpesviral infection of urogenital system, unspecified: Secondary | ICD-10-CM | POA: Diagnosis present

## 2017-04-16 LAB — TYPE AND SCREEN
ABO/RH(D): A POS
ANTIBODY SCREEN: NEGATIVE

## 2017-04-16 LAB — ABO/RH: ABO/RH(D): A POS

## 2017-04-16 LAB — CBC
HEMATOCRIT: 33.2 % — AB (ref 36.0–46.0)
Hemoglobin: 11.4 g/dL — ABNORMAL LOW (ref 12.0–15.0)
MCH: 30.5 pg (ref 26.0–34.0)
MCHC: 34.3 g/dL (ref 30.0–36.0)
MCV: 88.8 fL (ref 78.0–100.0)
PLATELETS: 336 10*3/uL (ref 150–400)
RBC: 3.74 MIL/uL — ABNORMAL LOW (ref 3.87–5.11)
RDW: 14.4 % (ref 11.5–15.5)
WBC: 17 10*3/uL — AB (ref 4.0–10.5)

## 2017-04-16 LAB — RAPID URINE DRUG SCREEN, HOSP PERFORMED
AMPHETAMINES: NOT DETECTED
BARBITURATES: NOT DETECTED
BENZODIAZEPINES: NOT DETECTED
Cocaine: NOT DETECTED
Opiates: NOT DETECTED
Tetrahydrocannabinol: POSITIVE — AB

## 2017-04-16 LAB — RPR: RPR Ser Ql: NONREACTIVE

## 2017-04-16 MED ORDER — LACTATED RINGERS IV SOLN
INTRAVENOUS | Status: DC
  Administered 2017-04-16: 23:00:00 via INTRAUTERINE

## 2017-04-16 MED ORDER — ACETAMINOPHEN 325 MG PO TABS: 650.0000 mg | ORAL_TABLET | ORAL | Status: DC | PRN

## 2017-04-16 MED ORDER — MISOPROSTOL 25 MCG QUARTER TABLET
25.0000 ug | ORAL_TABLET | ORAL | Status: DC | PRN
  Administered 2017-04-16: 25 ug via VAGINAL
  Filled 2017-04-16: qty 1

## 2017-04-16 MED ORDER — LIDOCAINE HCL (PF) 1 % IJ SOLN
INTRAMUSCULAR | Status: DC | PRN
  Administered 2017-04-16: 7 mL via EPIDURAL
  Administered 2017-04-16: 6 mL via EPIDURAL

## 2017-04-16 MED ORDER — OXYTOCIN 40 UNITS IN LACTATED RINGERS INFUSION - SIMPLE MED
1.0000 m[IU]/min | INTRAVENOUS | Status: DC
  Administered 2017-04-16: 2 m[IU]/min via INTRAVENOUS

## 2017-04-16 MED ORDER — MISOPROSTOL 200 MCG PO TABS: 50.0000 ug | ORAL_TABLET | ORAL | Status: DC | PRN

## 2017-04-16 MED ORDER — TERBUTALINE SULFATE 1 MG/ML IJ SOLN
0.2500 mg | Freq: Once | INTRAMUSCULAR | Status: DC | PRN
  Filled 2017-04-16: qty 1

## 2017-04-16 MED ORDER — LIDOCAINE HCL (PF) 1 % IJ SOLN
30.0000 mL | INTRAMUSCULAR | Status: DC | PRN
  Filled 2017-04-16: qty 30

## 2017-04-16 MED ORDER — FENTANYL CITRATE (PF) 100 MCG/2ML IJ SOLN
50.0000 ug | INTRAMUSCULAR | Status: DC | PRN
  Administered 2017-04-16 (×2): 50 ug via INTRAVENOUS
  Filled 2017-04-16 (×2): qty 2

## 2017-04-16 MED ORDER — SOD CITRATE-CITRIC ACID 500-334 MG/5ML PO SOLN
30.0000 mL | ORAL | Status: DC | PRN
  Administered 2017-04-16: 30 mL via ORAL
  Filled 2017-04-16: qty 15

## 2017-04-16 MED ORDER — FENTANYL 2.5 MCG/ML BUPIVACAINE 1/10 % EPIDURAL INFUSION (WH - ANES)
14.0000 mL/h | INTRAMUSCULAR | Status: DC | PRN
  Administered 2017-04-16 (×3): 14 mL/h via EPIDURAL
  Filled 2017-04-16 (×4): qty 100

## 2017-04-16 MED ORDER — LACTATED RINGERS IV SOLN
500.0000 mL | Freq: Once | INTRAVENOUS | Status: AC
  Administered 2017-04-16: 500 mL via INTRAVENOUS

## 2017-04-16 MED ORDER — OXYCODONE-ACETAMINOPHEN 5-325 MG PO TABS: 1.0000 | ORAL_TABLET | ORAL | Status: DC | PRN

## 2017-04-16 MED ORDER — OXYTOCIN 40 UNITS IN LACTATED RINGERS INFUSION - SIMPLE MED
2.5000 [IU]/h | INTRAVENOUS | Status: DC
  Filled 2017-04-16: qty 1000

## 2017-04-16 MED ORDER — OXYTOCIN BOLUS FROM INFUSION: 500.0000 mL | Freq: Once | INTRAVENOUS | Status: DC

## 2017-04-16 MED ORDER — PHENYLEPHRINE 40 MCG/ML (10ML) SYRINGE FOR IV PUSH (FOR BLOOD PRESSURE SUPPORT)
80.0000 ug | PREFILLED_SYRINGE | INTRAVENOUS | Status: DC | PRN
  Administered 2017-04-16: 80 ug via INTRAVENOUS
  Filled 2017-04-16: qty 10
  Filled 2017-04-16: qty 5
  Filled 2017-04-16: qty 10

## 2017-04-16 MED ORDER — LACTATED RINGERS IV SOLN: 500.0000 mL | INTRAVENOUS | Status: DC | PRN

## 2017-04-16 MED ORDER — DIPHENHYDRAMINE HCL 50 MG/ML IJ SOLN
12.5000 mg | INTRAMUSCULAR | Status: DC | PRN
  Administered 2017-04-16: 12.5 mg via INTRAVENOUS
  Filled 2017-04-16: qty 1

## 2017-04-16 MED ORDER — FLEET ENEMA 7-19 GM/118ML RE ENEM: 1.0000 | ENEMA | RECTAL | Status: DC | PRN

## 2017-04-16 MED ORDER — OXYCODONE-ACETAMINOPHEN 5-325 MG PO TABS: 2.0000 | ORAL_TABLET | ORAL | Status: DC | PRN

## 2017-04-16 MED ORDER — HYDROXYZINE HCL 50 MG PO TABS
50.0000 mg | ORAL_TABLET | Freq: Four times a day (QID) | ORAL | Status: DC | PRN
  Administered 2017-04-16: 50 mg via ORAL
  Filled 2017-04-16 (×2): qty 1

## 2017-04-16 MED ORDER — LACTATED RINGERS IV SOLN
INTRAVENOUS | Status: DC
  Administered 2017-04-16 (×3): via INTRAVENOUS

## 2017-04-16 MED ORDER — PHENYLEPHRINE 40 MCG/ML (10ML) SYRINGE FOR IV PUSH (FOR BLOOD PRESSURE SUPPORT)
80.0000 ug | PREFILLED_SYRINGE | INTRAVENOUS | Status: AC | PRN
  Administered 2017-04-16 (×3): 80 ug via INTRAVENOUS

## 2017-04-16 MED ORDER — EPHEDRINE 5 MG/ML INJ
10.0000 mg | INTRAVENOUS | Status: DC | PRN
  Filled 2017-04-16: qty 2

## 2017-04-16 MED ORDER — ONDANSETRON HCL 4 MG/2ML IJ SOLN
4.0000 mg | Freq: Four times a day (QID) | INTRAMUSCULAR | Status: DC | PRN
Start: 2017-04-16 — End: 2017-04-17
  Administered 2017-04-16 (×3): 4 mg via INTRAVENOUS
  Filled 2017-04-16 (×3): qty 2

## 2017-04-16 NOTE — Progress Notes (Signed)
Patient ID: Roberta Baker, female   DOB: 04/30/1986, 31 y.o.   MRN: [id number] Labor Progress Note  S: Patient examined for progress of labor. Patient comfortable with epidural and sleeping.  O: BP (!) 91/54   Pulse 71   Temp 98.3 F (36.8 C) (Axillary)   Resp 16   Ht 5\' 3"  (1.6 m)   Wt 57.2 kg (126 lb)   LMP 07/05/2016   SpO2 100%   BMI 22.32 kg/m   FHT: 150bpm, mod var, +accels, variables and lates decels TOCO: q1-552min, patient looks comfortable during contractions  CVE: Dilation: 4.5 Effacement (%): 50 Cervical Position: Middle Station: -2 Presentation: Vertex Exam by:: Londell Mohhristy Goodman, RN  A&P: 31 y.o. G1P0000 4653w0d here for IOL for IUGR  S/p cytotec x1 and foley bulb Continue current management Anticipate SVD  [place] Marise Knapper, DO FM Resident PGY-1 04/16/2017 6:55 AM

## 2017-04-16 NOTE — Anesthesia Pain Management Evaluation Note (Signed)
  CRNA Pain Management Visit Note  Patient: Roberta Baker, 31 y.o., female  "Hello I am a [facility] of the [facility]. We have an anesthesia team available at all times to provide care throughout the hospital, including epidural management and anesthesia for C-section. I don't know your plan for the delivery whether it a natural birth, water birth, IV sedation, nitrous supplementation, doula or epidural, but we want to meet your pain goals."   1.Was your pain managed to your expectations on prior hospitalizations?   No prior hospitalizations  2.What is your expectation for pain management during this hospitalization?     Epidural  3.How can we help you reach that goal? Epidural in place and working well  Record the patient's initial score and the patient's pain goal.   Pain: 0  Pain Goal: 5 [facility] wants you to be able to say your pain was always managed very well.  Roberta Baker 04/16/2017

## 2017-04-16 NOTE — Progress Notes (Signed)
Patient ID: Roberta HatchetJacqueline M Grullon, female   DOB: [birth year], 31 y.o.   MRN: [id number] Labor Progress Note  S: Patient seen & examined for progress of labor. Patient anxious and withdrawn.   O: BP 106/67   Pulse 98   Temp 98.5 F (36.9 C) (Oral)   Resp 18   Ht 5\' 3"  (1.6 m)   Wt 57.2 kg (126 lb)   LMP 07/05/2016   BMI 22.32 kg/m   FHT: 120bpm, mod var, +accels, no decels- when able to trace TOCO: irregular, patient looks comfortable during contractions  CVE:    A&P: 31 y.o. G1P0000 7116w0d here for IOL for IUGR  Currently on cytotec x1, Foley bulb placed at 0245 Continue induction management Anticipate SVD  [name], DO FM Resident PGY-1 04/16/2017 3:00 AM

## 2017-04-16 NOTE — Anesthesia Preprocedure Evaluation (Signed)
Anesthesia Evaluation  Patient identified by MRN, date of birth, ID band Patient awake    Reviewed: Allergy & Precautions, H&P , NPO status , Patient's Chart, lab work & pertinent test results  Airway Mallampati: I  TM Distance: >3 FB Neck ROM: full    Dental no notable dental hx. (+) Teeth Intact   Pulmonary neg pulmonary ROS, former smoker,    Pulmonary exam normal breath sounds clear to auscultation       Cardiovascular negative cardio ROS Normal cardiovascular exam Rhythm:regular Rate:Normal     Neuro/Psych    GI/Hepatic negative GI ROS, Neg liver ROS,   Endo/Other  negative endocrine ROS  Renal/GU   negative genitourinary   Musculoskeletal negative musculoskeletal ROS (+)   Abdominal Normal abdominal exam  (+)   Peds  Hematology  (+) anemia ,   Anesthesia Other Findings   Reproductive/Obstetrics (+) Pregnancy                             Anesthesia Physical Anesthesia Plan  ASA: II  Anesthesia Plan: Epidural   Post-op Pain Management:    Induction:   PONV Risk Score and Plan:   Airway Management Planned:   Additional Equipment:   Intra-op Plan:   Post-operative Plan:   Informed Consent: I have reviewed the patients History and Physical, chart, labs and discussed the procedure including the risks, benefits and alternatives for the proposed anesthesia with the patient or authorized representative who has indicated his/her understanding and acceptance.     Plan Discussed with:   Anesthesia Plan Comments:         Anesthesia Quick Evaluation

## 2017-04-16 NOTE — Progress Notes (Signed)
Labor Progress Note Roberta Baker is a 31 y.o. G1P0000 at 5597w0d presented for IOL for IUGR S: Patient doing well, pain managed with epidural   O:  BP 99/63   Pulse 74   Temp 98.3 F (36.8 C) (Oral)   Resp 18   Ht 5\' 3"  (1.6 m)   Wt 57.2 kg (126 lb)   LMP 07/05/2016   SpO2 100%   BMI 22.32 kg/m  EFM: 135/moderate variability/no decels  CVE: Dilation: (P) 5 Effacement (%): 60 Cervical Position: Anterior Station: -2, -1 Presentation: (P) Vertex Exam by:: (P) Caryl AdaJazma Phelps   A&P: 30 y.o. G1P0000 6097w0d in for IOL for IUGR #Labor: Was not progressing on pitocin along; AROM at  Around 1815 #Pain: epidural  #FWB: cat 1 #GBS negative HSV - no lesions, Depression - SW consult (chronic/other problems)  Ignacia MarvelKendrick C Rewa Weissberg, MD 6:38 PM

## 2017-04-16 NOTE — Progress Notes (Signed)
Labor Progress Note Roberta Baker is a 31 y.o. G1P0000 at 6764w0d presented for IOL for IUGR  S: Patient doing well, pain managed with epidural. AROM @ 18:20. S/p cytotec x1.   O:  BP (!) 92/56   Pulse 71   Temp 98.3 F (36.8 C) (Oral)   Resp 18   Ht 5\' 3"  (1.6 m)   Wt 57.2 kg (126 lb)   LMP 07/05/2016   SpO2 100%   Breastfeeding? Unknown   BMI 22.32 kg/m  EFM: 130 BPM baseline, moderate variability, + accels, variable decels  CVE: Dilation: 5 Effacement (%): 60 Cervical Position: Anterior Station: -2, -1 Presentation: Vertex Exam by:: Roberta Baker   A&P: 31 y.o. 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

## 2017-04-16 NOTE — Progress Notes (Signed)
-  Correction from resident progress note; patient is not on pitocin at this time. Adequate contractions at this time and patient making changes. Augment with pitocin as needed. -Epidural for pain management -CSW consult placed for h/o anxiety/depression, [facility], and +THC -Category 1 tracing  [name], DO OB Fellow Faculty Practice, [facility] 04/16/2017, 1:02 PM

## 2017-04-16 NOTE — Progress Notes (Signed)
Geraldine SolarJacqueline M Loden is a 31 y.o. G1P0000 at 1273w0d by ultrasound admitted for induction of labor due to IUGR.  Subjective: Patient doing well after epidural, still uncomfortable during contraction, but improved.   Objective: BP 99/65   Pulse 93   Temp 98.7 F (37.1 C) (Oral)   Resp 20   Ht 5\' 3"  (1.6 m)   Wt 57.2 kg (126 lb)   LMP 07/05/2016   SpO2 100%   BMI 22.32 kg/m  No intake/output data recorded. No intake/output data recorded.  FHT: 140bpm/ unable to comment on variability/ accels/decel, tracing is not consistent.  UC:   regular, every 2-4 minutes SVE:   Dilation: 5.5 Effacement (%): 70 Station: -2 Exam by:: raney gagnon rnc  Labs: Lab Results  Component Value Date   WBC 17.0 (H) 04/16/2017   HGB 11.4 (L) 04/16/2017   HCT 33.2 (L) 04/16/2017   MCV 88.8 04/16/2017   PLT 336 04/16/2017    Assessment / Plan:2 IOL for IUGR; Will augement with pit 2x2  Labor: Progressing on Pitocin, will continue to increase then AROM  Fetal Wellbeing:  Category I Pain Control:  Epidural I/D:  n/a Anticipated MOD:  NSVD  Freedom Lopezperez C Jessalyn Hinojosa 04/16/2017, 11:38 AM

## 2017-04-16 NOTE — Anesthesia Procedure Notes (Signed)
Epidural Patient location during procedure: OB Start time: 04/16/2017 5:26 AM End time: 04/16/2017 5:28 AM  Staffing Anesthesiologist: Leilani AbleHATCHETT, Analisia Kingsford Performed: anesthesiologist   Preanesthetic Checklist Completed: patient identified, surgical consent, pre-op evaluation, timeout performed, IV checked, risks and benefits discussed and monitors and equipment checked  Epidural Patient position: sitting Prep: site prepped and draped and DuraPrep Patient monitoring: continuous pulse ox and blood pressure Approach: midline Location: L3-L4 Injection technique: LOR air  Needle:  Needle type: Tuohy  Needle gauge: 17 G Needle length: 9 cm and 9 Needle insertion depth: 5 cm cm Catheter type: closed end flexible Catheter size: 19 Gauge Catheter at skin depth: 10 cm Test dose: negative and Other  Assessment Sensory level: T9 Events: blood not aspirated, injection not painful, no injection resistance, negative IV test and no paresthesia  Additional Notes Reason for block:procedure for pain

## 2017-04-16 NOTE — H&P (Signed)
LABOR AND DELIVERY ADMISSION HISTORY AND PHYSICAL NOTE  Roberta Baker is a 31 y.o. female G1P0000 with IUP at 1926w0d by 18wk US presenting for IOL for IUGR.   She reports positive fetal movement. She denies leakage of fluid or vaginal bleeding. She denies headaches, blurry vision, RUQ pain.  Prenatal History/Complications:  Past Medical History: Past Medical History:  Diagnosis Date  . Anxiety   . Headache   . Infection    UTI  . Kidney infection   . Kidney stones   . Nausea and vomiting in pregnancy 02/07/2017  . Pain, dental 02/07/2017  . 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 BREAST ENHANCEMENT SURGERY    . BREAST SURGERY      Obstetrical History: OB History    Gravida Para Term Preterm AB Living   1       0 0   SAB TAB Ectopic Multiple Live Births                  Social History: Social History   Social History  . Marital status: Single    Spouse name: N/A  . Number of children: N/A  . Years of education: N/A   Social History Main Topics  . Smoking status: Former Smoker    Packs/day: 0.25    Years: 0.50    Types: Cigarettes  . Smokeless tobacco: Never Used  . Alcohol use No  . Drug use: No     Comment: last was ? sometime in the Spring as of 8/10 exposed second hand  . Sexual activity: Yes    Birth control/ protection: None   Other Topics Concern  . Not on file   Social History Narrative  . No narrative on file    Family History: Family History  Problem Relation Age of Onset  . Mental illness Mother   . Cancer Maternal Uncle   . Cancer Maternal Grandmother        breast cancer  . Heart attack Maternal Grandfather     Allergies: Allergies  Allergen Reactions  . Sulfa Antibiotics Nausea And Vomiting    Prescriptions Prior to Admission  Medication Sig Dispense Refill Last Dose  . acetaminophen (TYLENOL) 500 MG tablet Take 500 mg by mouth every 6  (six) hours as needed for moderate pain.   Taking  . acyclovir (ZOVIRAX) 400 MG tablet Take 1 tablet (400 mg total) by mouth 3 (three) times daily. 90 tablet 3 Taking  . flintstones complete (FLINTSTONES) 60 MG chewable tablet Chew 2 tablets by mouth daily.    Taking  . omeprazole (PRILOSEC) 20 MG capsule Take 1 capsule (20 mg total) by mouth daily. 1 tablet a day (Patient not taking: Reported on 04/11/2017) 30 capsule 6 Not Taking     Review of Systems   All systems reviewed and negative except as stated in HPI  Last menstrual period 07/05/2016.  General appearance: alert, cooperative and no distress Abdomen: soft, non-tender;  Extremities: No calf swelling or tenderness Presentation: cephalic Fetal monitoring: Category 1. Baseline  Uterine activity: No ctx     Prenatal labs: ABO, Rh: --/--/A POS (03/02 16100916) Antibody: Negative (07/12 0848) Rubella: Immune RPR: Non Reactive (07/12 0848)  HBsAg: Negative (04/11 1150)  HIV:  Non Reactive  GBS: Negative (07/26 0000)  3 hr GTT: 79, 134, 102 Genetic screening: N/a Anatomy US: Normal  Prenatal Transfer Tool  Maternal Diabetes: No Genetic Screening: Declined Maternal Ultrasounds/Referrals: Normal Fetal Ultrasounds or other Referrals:  None Maternal Substance Abuse:  No Significant Maternal Medications:  Meds include: Other: Acyclovir Significant Maternal Lab Results: Lab values include: Group B Strep negative  No results found for this or any previous visit (from the past 24 hour(s)).  Patient Active Problem List   Diagnosis Date Noted  . IUGR (intrauterine growth restriction) affecting care of mother 03/14/2017  . 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 Pain, dental 02/07/2017  . Supervision of high risk pregnancy, antepartum 12/12/2016  . Late and limited prenatal care 12/12/2016  . 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 Depression with anxiety 12/12/2016    Assessment: Roberta Baker is a 31 y.o. G1P0000 at [redacted]w[redacted]d here for IOL for  IUGR.  #Labor: Induction protocol, foley bulb and cytotec #Pain: Epidural upon request #FWB: Category 1 #ID: GBS Neg #MOF: Breast #MOC: Depo #Circ: outpatient #HSV infection: On suppression since 34wks, no reported outbreaks since 2007  Dannette Barbara, Medical Student 04/16/2017, 12:55 AM

## 2017-04-17 ENCOUNTER — Encounter (HOSPITAL_COMMUNITY): Payer: Self-pay

## 2017-04-17 DIAGNOSIS — Z8759 Personal history of other complications of pregnancy, childbirth and the puerperium: Secondary | ICD-10-CM

## 2017-04-17 DIAGNOSIS — Z3A39 39 weeks gestation of pregnancy: Secondary | ICD-10-CM

## 2017-04-17 DIAGNOSIS — O365930 Maternal care for other known or suspected poor fetal growth, third trimester, not applicable or unspecified: Secondary | ICD-10-CM

## 2017-04-17 MED ORDER — ZOLPIDEM TARTRATE 5 MG PO TABS: 5.0000 mg | ORAL_TABLET | Freq: Every evening | ORAL | Status: DC | PRN

## 2017-04-17 MED ORDER — ONDANSETRON HCL 4 MG PO TABS: 4.0000 mg | ORAL_TABLET | ORAL | Status: DC | PRN

## 2017-04-17 MED ORDER — SENNOSIDES-DOCUSATE SODIUM 8.6-50 MG PO TABS
2.0000 | ORAL_TABLET | ORAL | Status: DC
  Administered 2017-04-19: 2 via ORAL
  Filled 2017-04-17 (×2): qty 2

## 2017-04-17 MED ORDER — BENZOCAINE-MENTHOL 20-0.5 % EX AERO: 1.0000 "application " | INHALATION_SPRAY | CUTANEOUS | Status: DC | PRN

## 2017-04-17 MED ORDER — WITCH HAZEL-GLYCERIN EX PADS: 1.0000 "application " | MEDICATED_PAD | CUTANEOUS | Status: DC | PRN

## 2017-04-17 MED ORDER — OXYCODONE-ACETAMINOPHEN 5-325 MG PO TABS
1.0000 | ORAL_TABLET | Freq: Four times a day (QID) | ORAL | Status: DC | PRN
  Administered 2017-04-17 – 2017-04-18 (×5): 1 via ORAL
  Filled 2017-04-17 (×5): qty 1

## 2017-04-17 MED ORDER — DIBUCAINE 1 % RE OINT: 1.0000 "application " | TOPICAL_OINTMENT | RECTAL | Status: DC | PRN

## 2017-04-17 MED ORDER — IBUPROFEN 600 MG PO TABS
600.0000 mg | ORAL_TABLET | Freq: Four times a day (QID) | ORAL | Status: DC
  Administered 2017-04-17 – 2017-04-19 (×8): 600 mg via ORAL
  Filled 2017-04-17 (×8): qty 1

## 2017-04-17 MED ORDER — DIPHENHYDRAMINE HCL 25 MG PO CAPS
25.0000 mg | ORAL_CAPSULE | Freq: Four times a day (QID) | ORAL | Status: DC | PRN
  Administered 2017-04-17: 25 mg via ORAL
  Filled 2017-04-17: qty 1

## 2017-04-17 MED ORDER — COCONUT OIL OIL
1.0000 "application " | TOPICAL_OIL | Status: DC | PRN
  Administered 2017-04-18: 1 via TOPICAL
  Filled 2017-04-17: qty 120

## 2017-04-17 MED ORDER — ONDANSETRON HCL 4 MG/2ML IJ SOLN: 4.0000 mg | INTRAMUSCULAR | Status: DC | PRN

## 2017-04-17 MED ORDER — PRENATAL MULTIVITAMIN CH
1.0000 | ORAL_TABLET | Freq: Every day | ORAL | Status: DC
  Administered 2017-04-17 – 2017-04-18 (×2): 1 via ORAL
  Filled 2017-04-17 (×2): qty 1

## 2017-04-17 MED ORDER — TETANUS-DIPHTH-ACELL PERTUSSIS 5-2.5-18.5 LF-MCG/0.5 IM SUSP: 0.5000 mL | Freq: Once | INTRAMUSCULAR | Status: DC

## 2017-04-17 MED ORDER — SIMETHICONE 80 MG PO CHEW: 80.0000 mg | CHEWABLE_TABLET | ORAL | Status: DC | PRN

## 2017-04-17 MED ORDER — ACETAMINOPHEN 325 MG PO TABS
650.0000 mg | ORAL_TABLET | ORAL | Status: DC | PRN
Start: 2017-04-17 — End: 2017-04-19
  Administered 2017-04-17 (×2): 650 mg via ORAL
  Filled 2017-04-17 (×2): qty 2

## 2017-04-17 NOTE — Progress Notes (Signed)
Patient c/o pain, score of 9-10 of lower back and sides. Pt states it is "unbearable and not able to fully take care of baby and breastfeed". RN spoke with Dr. Emelda FearFerguson to ask for stronger medication than Ibuprofen.

## 2017-04-17 NOTE — Lactation Note (Signed)
This note was copied from a baby's chart. Lactation Consultation Note New mom w/semi flat nipples, round breast. Hand expression taught, easy flow.  Mom encouraged to feed baby 8-12 times/24 hours and with feeding cues. Encouraged to wake baby if hasn't cued to feed 3 hrs. Massage breast occasionally to express more during feedings.  Hand pump given to evert nipple. Fitted mom w/#16 NS for latching. Baby didn't have any trouble latching. Shells given to wear in bra to assist in everting nipple. Hand expression taught w/colostrum noted. Encouraged cheeks to breast. Educated I&O, cluster feeding, supply and demand.  WH/LC brochure given w/resources, support groups and LC services. Patient Name: Roberta Baker UEAVW'UToday's Date: 04/17/2017 Reason for consult: Initial assessment   Maternal Data Has patient been taught Hand Expression?: Yes Does the patient have breastfeeding experience prior to this delivery?: No  Feeding Feeding Type: Breast Fed Length of feed: 10 min (still BF)  LATCH Score Latch: Repeated attempts needed to sustain latch, nipple held in mouth throughout feeding, stimulation needed to elicit sucking reflex.  Audible Swallowing: A few with stimulation  Type of Nipple: Everted at rest and after stimulation (semi flat)  Comfort (Breast/Nipple): Soft / non-tender  Hold (Positioning): Assistance needed to correctly position infant at breast and maintain latch.  LATCH Score: 7  Interventions Interventions: Breast feeding basics reviewed;Support pillows;Assisted with latch;Position options;Skin to skin;Expressed milk;Breast massage;Hand express;Shells;Pre-pump if needed;Hand pump;Breast compression;Adjust position  Lactation Tools Discussed/Used Tools: Shells;Pump Shell Type: Inverted Breast pump type: Manual Pump Review: Setup, frequency, and cleaning;Milk Storage Initiated by:: Peri JeffersonL. Duaine Radin RN  Date initiated:: 04/17/17   Consult Status Consult Status:  Follow-up Date: 04/17/17 (in pm) Follow-up type: In-patient    Charyl DancerCARVER, Roberta Baker 04/17/2017, 4:36 AM

## 2017-04-17 NOTE — Anesthesia Postprocedure Evaluation (Signed)
Anesthesia Post Note  Patient: Roberta SolarJacqueline M Spilman  Procedure(s) Performed: * No procedures listed *     Patient location during evaluation: Mother Baby Anesthesia Type: Epidural Level of consciousness: awake and alert Pain management: pain level controlled Vital Signs Assessment: post-procedure vital signs reviewed and stable Respiratory status: spontaneous breathing, nonlabored ventilation and respiratory function stable Cardiovascular status: stable Postop Assessment: no headache, no backache and epidural receding Anesthetic complications: no    Last Vitals:  Vitals:   04/17/17 0320 04/17/17 0800  BP: 91/63 (!) 101/56  Pulse: 68 60  Resp: 18 20  Temp: 36.7 C 36.7 C  SpO2:      Last Pain:  Vitals:   04/17/17 0800  TempSrc: Oral  PainSc:    Pain Goal:                 Junious SilkGILBERT,Milley Vining

## 2017-04-18 LAB — BIRTH TISSUE RECOVERY COLLECTION (PLACENTA DONATION)

## 2017-04-18 MED ORDER — MEDROXYPROGESTERONE ACETATE 150 MG/ML IM SUSP
150.0000 mg | Freq: Once | INTRAMUSCULAR | Status: AC
  Administered 2017-04-18: 150 mg via INTRAMUSCULAR
  Filled 2017-04-18: qty 1

## 2017-04-18 MED ORDER — IBUPROFEN 600 MG PO TABS: 600.0000 mg | ORAL_TABLET | Freq: Four times a day (QID) | ORAL | 0 refills | Status: DC

## 2017-04-18 NOTE — Progress Notes (Signed)
Post Partum Day #1 Subjective: no complaints, up ad lib, voiding, tolerating PO and would like to stay until tomorrow as she does not have a ride today.  Objective: Blood pressure 100/60, pulse 79, temperature 97.8 F (36.6 C), temperature source Oral, resp. rate 18, height 5\' 3"  (1.6 m), weight 57.2 kg (126 lb), last menstrual period 07/05/2016, SpO2 100 %, unknown if currently breastfeeding.  Physical Exam:  General: alert Lochia: appropriate Uterine Fundus: firm and NT at U DVT Evaluation: No evidence of DVT seen on physical exam.   Recent Labs  04/16/17 0115  HGB 11.4*  HCT 33.2*    Assessment/Plan: Plan for discharge tomorrow  Breast Depo provera   LOS: 2 days   Allie BossierMyra C Burns Timson 04/18/2017, 6:44 AM

## 2017-04-18 NOTE — Lactation Note (Signed)
This note was copied from a baby's chart. Lactation Consultation Note F/u w/mom on BF. MGM walking baby in rm. Stated baby wanting to BF all night. Reminded cluster feeding. [facility] to [facility], asking for RN d/t pain.  LC noted on first visit MGM pushing mom to BF. ? If mom really wants to BF. Appears to be grumpy, ? D/t pain. Slightly withdrawn on first visit.  LC talked slow on this visit, stated LC talked to fast on first visit.  Encouraged to call if needs assistance or has questions.  Patient Name: Roberta Baker ZOXWR'UToday's Date: 04/18/2017 Reason for consult: Follow-up assessment   Maternal Data    Feeding Feeding Type: Breast Fed  LATCH Score                   Interventions    Lactation Tools Discussed/Used     Consult Status Consult Status: Follow-up Date: 04/18/17 Follow-up type: In-patient    Charyl DancerCARVER, Riyansh Gerstner G 04/18/2017, 3:31 AM

## 2017-04-18 NOTE — Clinical Social Work Maternal (Signed)
CLINICAL SOCIAL WORK MATERNAL/CHILD NOTE  Patient Details  Name: Roberta Baker MRN: [id number] Date of Birth: 03/06/1986  Date:  04/18/2017  Clinical Social Worker Initiating Note:  Roberta Baker Date/ Time Initiated:  04/18/17/1045     Child's Name:  Roberta Baker   Legal Guardian:  Mother (FOB  is Roberta Baker)   Need for Interpreter:  None   Date of Referral:  04/18/17     Reason for Referral:  Current Substance Use/Substance Use During Pregnancy , Behavioral Health Issues, including SI    Referral Source:  Central Nursery   Address:  [address] [place] [address]  Phone number:  [phone]   Household Members:  Self, Roommate   Natural Supports (not living in the home):  Immediate Family (FOB's will also provide support.)   Professional Supports: None   Employment: Unemployed   Type of Work:     Education:  High school graduate   Financial Resources:  Medicaid   Other Resources:  WIC, Food Stamps    Cultural/Religious Considerations Which May Impact Care:  Per [name]'s Face Sheet, [name] is [employer].   Strengths:  Ability to meet basic needs , Understanding of illness, Home prepared for child    Risk Factors/Current Problems:  Substance Use , Mental Health Concerns    Cognitive State:  Able to Concentrate , Alert , Insightful , Linear Thinking , Goal Oriented    Mood/Affect:  Calm , Happy , Relaxed , Interested , Comfortable    CSW Assessment: CSW met with [name] to complete an assessment for hx of substance abuse in pregnancy and hx of anxiety/depression. With [name]'s permission, CSW asked [name] guest to leave the room in effort to meet  with [name] in private. When CSW arrived, [name] was in bed attaching and bonding with infant as evident by engaging in breastfeeding.  [name] appeared relaxed and comfortable.CSW inquired about [name]'s substance use and [name] reported utilizing marijuana during pregnancy to decrease [name]'s nausea and to increase [name]'s  appetite. CSW informed [name] of the hospital's drug screen policy. CSW was made aware of the 2 drug screenings for the infant.  [name] was understanding and again openly disclosed smoking marijuana during pregnancy. CSW informed [name] that the infant's UDS was positive and informed [name] that CSW was making a report to [place] CPS. [name] was made aware that CSW will continue to monitor infant's CDS and will report the results to CPS; [name] was understanding. CSW explained CPS investigation process and encouraged [name] to ask questions.   CSW asked about [name]'s MH hx and [name] acknowledged a hx of anxiety.  [name] stated that [name] was prescribed Klonopin for 10 years and [name]'s signs and symptoms were managed.  [name] expressed that [name] discontinued Klonopin after pregnancy confirmation. CSW educated [name] PPD. CSW informed [name] of possible supports and interventions to decrease PPD.  CSW also encouraged [name] to seek medical attention if needed for increased signs and symptoms of PPD. CSW provided [name] with a NEW Mom Checklist developed by [facility]partum [facility] and encouraged [name] to notify a medical professional if symptoms arise.   CSW offered resources and referral for substance abuse counseling and parenting and [name] declined the information.     CSW made a report to [place] CPS Intake Worker Roberta Baker.  CPS will follow-up with family with-in 48 hours.  There are no barriers to d/c.  CSW Plan/Description:  Information/Referral to Community Resources , Patient/Family Education , Child Protective Service Report , No   Further Intervention Required/No Barriers to Discharge (CSW will montior infant's CDS and will report results to [place] CPS.)   Roberta Baker, MSW, LCSW Clinical Social Work [phone]   [name], LCSW 04/18/2017, 2:09 PM 

## 2017-04-18 NOTE — Lactation Note (Signed)
This note was copied from a baby's chart. Lactation Consultation Note  Patient Name: Boy Recardo EvangelistJacqueline Gulick JYNWG'NToday's Date: 04/18/2017 Reason for consult: Follow-up assessment  Patient is requesting another nipple shield. Mom's anatomy suggests that she may not need a nipple shield, but Mom wants one. Mom was shown how to use DEBP & how to disassemble, clean, & reassemble parts & encouraged to pump 4-6 x/day. Mom was observed pumping; size 21 flanges are appropriate for her at this time. Nipple shield (size 20) was provided. Mom was shown a different way to apply nipple shield to ensure it stayed on well. A small amount of colostrum/transitional milk (with a slight grayish-greenish tint) was expressed. It was spoon-fed/finger-fed to infant. Infant was noted to have a higher palate along with a labial frenulum that reaches to the bottom of the top gum.   Mom says she had breast augmentation surgery 3-4 years ago. Mom reports that her breasts appeared normal prior to surgery, but she wanted them larger.   Mom reported that she did not take Lexapro during pregnancy. She does not anticipate taking any meds post-discharge (besides Flintstones, IB). THC use during breastfeeding was not discussed during consult b/c of constant presence of MGM.   Lurline HareRichey, Sebastyan Snodgrass [facility] 04/18/2017, 12:25 PM

## 2017-04-18 NOTE — Discharge Instructions (Signed)
Breastfeeding °Deciding to breastfeed is one of the best choices you can make for you and your baby. A change in hormones during pregnancy causes your breast tissue to grow and increases the number and size of your milk ducts. These hormones also allow proteins, sugars, and fats from your blood supply to make breast milk in your milk-producing glands. Hormones prevent breast milk from being released before your baby is born as well as prompt milk flow after birth. Once breastfeeding has begun, thoughts of your baby, as well as his or her sucking or crying, can stimulate the release of milk from your milk-producing glands. °Benefits of breastfeeding °For Your Baby °· Your first milk (colostrum) helps your baby's digestive system function better. °· There are antibodies in your milk that help your baby fight off infections. °· Your baby has a lower incidence of asthma, allergies, and sudden infant death syndrome. °· The nutrients in breast milk are better for your baby than infant formulas and are designed uniquely for your baby’s needs. °· Breast milk improves your baby's brain development. °· Your baby is less likely to develop other conditions, such as childhood obesity, asthma, or type 2 diabetes mellitus. ° °For You °· Breastfeeding helps to create a very special bond between you and your baby. °· Breastfeeding is convenient. Breast milk is always available at the correct temperature and costs nothing. °· Breastfeeding helps to burn calories and helps you lose the weight gained during pregnancy. °· Breastfeeding makes your uterus contract to its prepregnancy size faster and slows bleeding (lochia) after you give birth. °· Breastfeeding helps to lower your risk of developing type 2 diabetes mellitus, osteoporosis, and breast or ovarian cancer later in life. ° °Signs that your baby is hungry °Early Signs of Hunger °· Increased alertness or activity. °· Stretching. °· Movement of the head from side to  side. °· Movement of the head and opening of the mouth when the corner of the mouth or cheek is stroked (rooting). °· Increased sucking sounds, smacking lips, cooing, sighing, or squeaking. °· Hand-to-mouth movements. °· Increased sucking of fingers or hands. ° °Late Signs of Hunger °· Fussing. °· Intermittent crying. ° °Extreme Signs of Hunger °Signs of extreme hunger will require calming and consoling before your baby will be able to breastfeed successfully. Do not wait for the following signs of extreme hunger to occur before you initiate breastfeeding: °· Restlessness. °· A loud, strong cry. °· Screaming. ° °Breastfeeding basics °Breastfeeding Initiation °· Find a comfortable place to sit or lie down, with your neck and back well supported. °· Place a pillow or rolled up blanket under your baby to bring him or her to the level of your breast (if you are seated). Nursing pillows are specially designed to help support your arms and your baby while you breastfeed. °· Make sure that your baby's abdomen is facing your abdomen. °· Gently massage your breast. With your fingertips, massage from your chest wall toward your nipple in a circular motion. This encourages milk flow. You may need to continue this action during the feeding if your milk flows slowly. °· Support your breast with 4 fingers underneath and your thumb above your nipple. Make sure your fingers are well away from your nipple and your baby’s mouth. °· Stroke your baby's lips gently with your finger or nipple. °· When your baby's mouth is open wide enough, quickly bring your baby to your breast, placing your entire nipple and as much of the colored area   around your nipple (areola) as possible into your baby's mouth. °? More areola should be visible above your baby's upper lip than below the lower lip. °? Your baby's tongue should be between his or her lower gum and your breast. °· Ensure that your baby's mouth is correctly positioned around your nipple  (latched). Your baby's lips should create a seal on your breast and be turned out (everted). °· It is common for your baby to suck about 2-3 minutes in order to start the flow of breast milk. ° °Latching °Teaching your baby how to latch on to your breast properly is very important. An improper latch can cause nipple pain and decreased milk supply for you and poor weight gain in your baby. Also, if your baby is not latched onto your nipple properly, he or she may swallow some air during feeding. This can make your baby fussy. Burping your baby when you switch breasts during the feeding can help to get rid of the air. However, teaching your baby to latch on properly is still the best way to prevent fussiness from swallowing air while breastfeeding. °Signs that your baby has successfully latched on to your nipple: °· Silent tugging or silent sucking, without causing you pain. °· Swallowing heard between every 3-4 sucks. °· Muscle movement above and in front of his or her ears while sucking. ° °Signs that your baby has not successfully latched on to nipple: °· Sucking sounds or smacking sounds from your baby while breastfeeding. °· Nipple pain. ° °If you think your baby has not latched on correctly, slip your finger into the corner of your baby’s mouth to break the suction and place it between your baby's gums. Attempt breastfeeding initiation again. °Signs of Successful Breastfeeding °Signs from your baby: °· A gradual decrease in the number of sucks or complete cessation of sucking. °· Falling asleep. °· Relaxation of his or her body. °· Retention of a small amount of milk in his or her mouth. °· Letting go of your breast by himself or herself. ° °Signs from you: °· Breasts that have increased in firmness, weight, and size 1-3 hours after feeding. °· Breasts that are softer immediately after breastfeeding. °· Increased milk volume, as well as a change in milk consistency and color by the fifth day of  breastfeeding. °· Nipples that are not sore, cracked, or bleeding. ° °Signs That Your Baby is Getting Enough Milk °· Wetting at least 1-2 diapers during the first 24 hours after birth. °· Wetting at least 5-6 diapers every 24 hours for the first week after birth. The urine should be clear or pale yellow by 5 days after birth. °· Wetting 6-8 diapers every 24 hours as your baby continues to grow and develop. °· At least 3 stools in a 24-hour period by age 5 days. The stool should be soft and yellow. °· At least 3 stools in a 24-hour period by age 7 days. The stool should be seedy and yellow. °· No loss of weight greater than 10% of birth weight during the first 3 days of age. °· Average weight gain of 4-7 ounces (113-198 g) per week after age 4 days. °· Consistent daily weight gain by age 5 days, without weight loss after the age of 2 weeks. ° °After a feeding, your baby may spit up a small amount. This is common. °Breastfeeding frequency and duration °Frequent feeding will help you make more milk and can prevent sore nipples and breast engorgement. Breastfeed when   you feel the need to reduce the fullness of your breasts or when your baby shows signs of hunger. This is called "breastfeeding on demand." Avoid introducing a pacifier to your baby while you are working to establish breastfeeding (the first 4-6 weeks after your baby is born). After this time you may choose to use a pacifier. Research has shown that pacifier use during the first year of a baby's life decreases the risk of sudden infant death syndrome (SIDS). °Allow your baby to feed on each breast as long as he or she wants. Breastfeed until your baby is finished feeding. When your baby unlatches or falls asleep while feeding from the first breast, offer the second breast. Because newborns are often sleepy in the first few weeks of life, you may need to awaken your baby to get him or her to feed. °Breastfeeding times will vary from baby to baby. However,  the following rules can serve as a guide to help you ensure that your baby is properly fed: °· Newborns (babies 4 weeks of age or younger) may breastfeed every 1-3 hours. °· Newborns should not go longer than 3 hours during the day or 5 hours during the night without breastfeeding. °· You should breastfeed your baby a minimum of 8 times in a 24-hour period until you begin to introduce solid foods to your baby at around 6 months of age. ° °Breast milk pumping °Pumping and storing breast milk allows you to ensure that your baby is exclusively fed your breast milk, even at times when you are unable to breastfeed. This is especially important if you are going back to work while you are still breastfeeding or when you are not able to be present during feedings. Your lactation consultant can give you guidelines on how long it is safe to store breast milk. °A breast pump is a machine that allows you to pump milk from your breast into a sterile bottle. The pumped breast milk can then be stored in a refrigerator or freezer. Some breast pumps are operated by hand, while others use electricity. Ask your lactation consultant which type will work best for you. Breast pumps can be purchased, but some hospitals and breastfeeding support groups lease breast pumps on a monthly basis. A lactation consultant can teach you how to hand express breast milk, if you prefer not to use a pump. °Caring for your breasts while you breastfeed °Nipples can become dry, cracked, and sore while breastfeeding. The following recommendations can help keep your breasts moisturized and healthy: °· Avoid using soap on your nipples. °· Wear a supportive bra. Although not required, special nursing bras and tank tops are designed to allow access to your breasts for breastfeeding without taking off your entire bra or top. Avoid wearing underwire-style bras or extremely tight bras. °· Air dry your nipples for 3-4 minutes after each feeding. °· Use only cotton  bra pads to absorb leaked breast milk. Leaking of breast milk between feedings is normal. °· Use lanolin on your nipples after breastfeeding. Lanolin helps to maintain your skin's normal moisture barrier. If you use pure lanolin, you do not need to wash it off before feeding your baby again. Pure lanolin is not toxic to your baby. You may also hand express a few drops of breast milk and gently massage that milk into your nipples and allow the milk to air dry. ° °In the first few weeks after giving birth, some women experience extremely full breasts (engorgement). Engorgement can make your   breasts feel heavy, warm, and tender to the touch. Engorgement peaks within 3-5 days after you give birth. The following recommendations can help ease engorgement: °· Completely empty your breasts while breastfeeding or pumping. You may want to start by applying warm, moist heat (in the shower or with warm water-soaked hand towels) just before feeding or pumping. This increases circulation and helps the milk flow. If your baby does not completely empty your breasts while breastfeeding, pump any extra milk after he or she is finished. °· Wear a snug bra (nursing or regular) or tank top for 1-2 days to signal your body to slightly decrease milk production. °· Apply ice packs to your breasts, unless this is too uncomfortable for you. °· Make sure that your baby is latched on and positioned properly while breastfeeding. ° °If engorgement persists after 48 hours of following these recommendations, contact your health care provider or a lactation consultant. °Overall health care recommendations while breastfeeding °· Eat healthy foods. Alternate between meals and snacks, eating 3 of each per day. Because what you eat affects your breast milk, some of the foods may make your baby more irritable than usual. Avoid eating these foods if you are sure that they are negatively affecting your baby. °· Drink milk, fruit juice, and water to  satisfy your thirst (about 10 glasses a day). °· Rest often, relax, and continue to take your prenatal vitamins to prevent fatigue, stress, and anemia. °· Continue breast self-awareness checks. °· Avoid chewing and smoking tobacco. Chemicals from cigarettes that pass into breast milk and exposure to secondhand smoke may harm your baby. °· Avoid alcohol and drug use, including marijuana. °Some medicines that may be harmful to your baby can pass through breast milk. It is important to ask your health care provider before taking any medicine, including all over-the-counter and prescription medicine as well as vitamin and herbal supplements. °It is possible to become pregnant while breastfeeding. If birth control is desired, ask your health care provider about options that will be safe for your baby. °Contact a health care provider if: °· You feel like you want to stop breastfeeding or have become frustrated with breastfeeding. °· You have painful breasts or nipples. °· Your nipples are cracked or bleeding. °· Your breasts are red, tender, or warm. °· You have a swollen area on either breast. °· You have a fever or chills. °· You have nausea or vomiting. °· You have drainage other than breast milk from your nipples. °· Your breasts do not become full before feedings by the fifth day after you give birth. °· You feel sad and depressed. °· Your baby is too sleepy to eat well. °· Your baby is having trouble sleeping. °· Your baby is wetting less than 3 diapers in a 24-hour period. °· Your baby has less than 3 stools in a 24-hour period. °· Your baby's skin or the white part of his or her eyes becomes yellow. °· Your baby is not gaining weight by 5 days of age. °Get help right away if: °· Your baby is overly tired (lethargic) and does not want to wake up and feed. °· Your baby develops an unexplained fever. °This information is not intended to replace advice given to you by your health care provider. Make sure you discuss  any questions you have with your health care provider. °Document Released: 08/20/2005 Document Revised: 02/01/2016 Document Reviewed: 02/11/2013 °Elsevier Interactive Patient Education © 2017 [employer] °Home Care Instructions for Mom °ACTIVITY °·   Gradually return to your regular activities. °· Let yourself rest. Nap while your baby sleeps. °· Avoid lifting anything that is heavier than 10 lb (4.5 kg) until your health care provider says it is okay. °· Avoid activities that take a lot of effort and energy (are strenuous) until approved by your health care provider. Walking at a slow-to-moderate pace is usually safe. °· If you had a cesarean delivery: °? Do not vacuum, climb stairs, or drive a car for 4-6 weeks. °? Have someone help you at home until you feel like you can do your usual activities yourself. °? Do exercises as told by your health care provider, if this applies. ° °VAGINAL BLEEDING °You may continue to bleed for 4-6 weeks after delivery. Over time, the amount of blood usually decreases and the color of the blood usually gets lighter. However, the flow of bright red blood may increase if you have been too active. If you need to use more than one pad in an hour because your pad gets soaked, or if you pass a large clot: °· Lie down. °· Raise your feet. °· Place a cold compress on your lower abdomen. °· Rest. °· Call your health care provider. ° °If you are breastfeeding, your period should return anytime between 8 weeks after delivery and the time that you stop breastfeeding. If you are not breastfeeding, your period should return 6-8 weeks after delivery. °PERINEAL CARE °The perineal area, or perineum, is the part of your body between your thighs. After delivery, this area needs special care. Follow these instructions as told by your health care provider. °· Take warm tub baths for 15-20 minutes. °· Use medicated pads and pain-relieving sprays and creams as told. °· Do not use tampons or douches until  vaginal bleeding has stopped. °· Each time you go to the bathroom: °? Use a peri bottle. °? Change your pad. °? Use towelettes in place of toilet paper until your stitches have healed. °· Do Kegel exercises every day. Kegel exercises help to maintain the muscles that support the vagina, bladder, and bowels. You can do these exercises while you are standing, sitting, or lying down. To do Kegel exercises: °? Tighten the muscles of your abdomen and the muscles that surround your birth canal. °? Hold for a few seconds. °? Relax. °? Repeat until you have done this 5 times in a row. °· To prevent hemorrhoids from developing or getting worse: °? Drink enough fluid to keep your urine clear or pale yellow. °? Avoid straining when having a bowel movement. °? Take over-the-counter medicines and stool softeners as told by your health care provider. ° °BREAST CARE °· Wear a tight-fitting bra. °· Avoid taking over-the-counter pain medicine for breast discomfort. °· Apply ice to the breasts to help with discomfort as needed: °? Put ice in a plastic bag. °? Place a towel between your skin and the bag. °? Leave the ice on for 20 minutes or as told by your health care provider. ° °NUTRITION °· Eat a well-balanced diet. °· Do not try to lose weight quickly by cutting back on calories. °· Take your prenatal vitamins until your postpartum checkup or until your health care provider tells you to stop. ° °POSTPARTUM DEPRESSION °You may find yourself crying for no apparent reason and unable to cope with all of the changes that come with having a newborn. This mood is called postpartum depression. Postpartum depression happens because your hormone levels change after delivery. If you have   postpartum depression, get support from your partner, friends, and family. If the depression does not go away on its own after several weeks, contact your health care provider. °BREAST SELF-EXAM °Do a breast self-exam each month, at the same time of the  month. If you are breastfeeding, check your breasts just after a feeding, when your breasts are less full. If you are breastfeeding and your period has started, check your breasts on day 5, 6, or 7 of your period. °Report any lumps, bumps, or discharge to your health care provider. Know that breasts are normally lumpy if you are breastfeeding. This is temporary, and it is not a health risk. °INTIMACY AND SEXUALITY °Avoid sexual activity for at least 3-4 weeks after delivery or until the brownish-red vaginal flow is completely gone. If you want to avoid pregnancy, use some form of birth control. You can get pregnant after delivery, even if you have not had your period. °SEEK MEDICAL CARE IF: °· You feel unable to cope with the changes that a child brings to your life, and these feelings do not go away after several weeks. °· You notice a lump, a bump, or discharge on your breast. ° °SEEK IMMEDIATE MEDICAL CARE IF: °· Blood soaks your pad in 1 hour or less. °· You have: °? Severe pain or cramping in your lower abdomen. °? A bad-smelling vaginal discharge. °? A fever that is not controlled by medicine. °? A fever, and an area of your breast is red and sore. °? Pain or redness in your calf. °? Sudden, severe chest pain. °? Shortness of breath. °? Painful or bloody urination. °? Problems with your vision. °· You vomit for 12 hours or longer. °· You develop a severe headache. °· You have serious thoughts about hurting yourself, your child, or anyone else. ° °This information is not intended to replace advice given to you by your health care provider. Make sure you discuss any questions you have with your health care provider. °Document Released: 08/17/2000 Document Revised: 01/26/2016 Document Reviewed: 02/21/2015 °Elsevier Interactive Patient Education © 2017 [employer] ° °

## 2017-04-18 NOTE — Progress Notes (Signed)
Mom refusing to answer questions on Edinburgh score.  States I do not want to be bothered.  Everyone is bothering me

## 2017-04-18 NOTE — Progress Notes (Signed)
Patient rating pain a 9/10 in her back and abdomen. Ibuprofen and heat given. Percocet offered but patient would like to eat. RN told her to call when ready for medicine. RN advised her to talk to MD on rounds about pain.Dr. Marice Potterove assessed patient and she denied any issues. Mom walking on the unit.  RN talked to Dr. Marice Potterove about pain on the unit and no new orders. Royston CowperIsley, Amelya Mabry E, RN

## 2017-04-19 MED ORDER — PROMETHAZINE HCL 25 MG PO TABS: 25.0000 mg | ORAL_TABLET | Freq: Four times a day (QID) | ORAL | 2 refills | Status: DC | PRN

## 2017-04-19 NOTE — Discharge Summary (Signed)
OB Discharge Summary  Patient Name: Roberta Baker DOB: 1986/05/13 MRN: [id number]  Date of admission: 04/16/2017 Delivering MD: Pincus Large   Date of discharge: 04/19/2017  Admitting diagnosis:   INDUCTION Intrauterine pregnancy: [redacted]w[redacted]d     Secondary diagnosis:Active Problems:   IUGR (intrauterine growth restriction) affecting care of mother   Status post vacuum-assisted vaginal delivery  Additional problems: anxiety/depression, + THC- had social services consult post partum      Discharge diagnosis: Term Pregnancy Delivered        Augmentation: AROM, Pitocin, Cytotec and Foley Balloon  Complications: None  Hospital course:  Induction of Labor With Vaginal Delivery   31 y.o. yo G1P1000 at [redacted]w[redacted]d was admitted to the hospital 04/16/2017 for induction of labor.  Indication for induction: IUGR.  Patient had an uncomplicated labor course as follows: Membrane Rupture Time/Date: 6:15 PM ,04/16/2017   Intrapartum Procedures: Episiotomy: None [1]                                         Lacerations:  None [1]  Patient had delivery of a Viable infant.  Information for the patient's newborn:  Sonia, Thomure [[id number]]  Delivery Method: Vaginal, Vacuum (Extractor) (Filed from Delivery Summary)   04/17/2017  Details of delivery can be found in separate delivery note.  Patient had a routine postpartum course. Patient is discharged home 04/19/17.  Physical exam  Vitals:   04/17/17 1809 04/18/17 0540 04/18/17 1850 04/19/17 0519  BP: (!) 101/54 100/60 (!) 106/56 114/68  Pulse: 79 79 77 78  Resp: 20 18 20 18   Temp: 98.2 F (36.8 C) 97.8 F (36.6 C) 98.5 F (36.9 C) 98.1 F (36.7 C)  TempSrc: Oral Oral Oral Oral  SpO2:  100%    Weight:      Height:       General: alert Lochia: appropriate Uterine Fundus: firm Incision: N/A DVT Evaluation: No evidence of DVT seen on physical exam. Labs: Lab Results  Component Value Date   WBC 17.0 (H) 04/16/2017   HGB  11.4 (L) 04/16/2017   HCT 33.2 (L) 04/16/2017   MCV 88.8 04/16/2017   PLT 336 04/16/2017   CMP Latest Ref Rng & Units 03/25/2017  Glucose 65 - 99 mg/dL 83  BUN 6 - 20 mg/dL <4(L)  Creatinine 9.37 - 1.00 mg/dL 9.02(I)  Sodium 097 - 353 mmol/L 138  Potassium 3.5 - 5.1 mmol/L 4.1  Chloride 101 - 111 mmol/L 104  CO2 22 - 32 mmol/L -  Calcium 8.9 - 10.3 mg/dL -  Total Protein 6.5 - 8.1 g/dL -  Total Bilirubin 0.3 - 1.2 mg/dL -  Alkaline Phos 38 - 299 U/L -  AST 15 - 41 U/L -  ALT 14 - 54 U/L -    Discharge instruction: per After Visit Summary and "Baby and Me Booklet".  After Visit Meds:  Allergies as of 04/19/2017      Reactions   Sulfa Antibiotics Nausea And Vomiting      Medication List    TAKE these medications   acetaminophen 500 MG tablet Commonly known as:  TYLENOL Take 500 mg by mouth every 6 (six) hours as needed for moderate pain.   acyclovir 400 MG tablet Commonly known as:  ZOVIRAX Take 1 tablet (400 mg total) by mouth 3 (three) times daily.   flintstones complete 60 MG  chewable tablet Chew 2 tablets by mouth daily.   ibuprofen 600 MG tablet Commonly known as:  ADVIL,MOTRIN Take 1 tablet (600 mg total) by mouth every 6 (six) hours.   omeprazole 20 MG capsule Commonly known as:  PRILOSEC Take 1 capsule (20 mg total) by mouth daily. 1 tablet a day       Diet: routine diet  Activity: Advance as tolerated. Pelvic rest for 6 weeks.   Outpatient follow up:4 weeks Follow up Appt:Future Appointments Date Time Provider Department Center  05/28/2017 1:30 PM Cresenzo-Dishmon, Scarlette Calico, CNM [facility]   Follow up visit: No Follow-up on file.  Postpartum contraception: Depo Provera  Newborn Data: Live born female  Birth Weight: 6 lb 1.4 oz (2761 g) APGAR: 8, 9  Baby Feeding: Bottle Disposition:home with mother   04/19/2017 Allie Bossier, MD

## 2017-04-19 NOTE — Lactation Note (Signed)
This note was copied from a baby's chart. Lactation Consultation Note  Patient Name: Roberta Baker VVOHY'W Date: 04/19/2017   Visited with Mom on day of discharge, baby 35 hrs old.  Mom on the phone, but stated she had decided to strictly formula feed.  She wants to knows more about types of formula, and Mom to ask her RN about this before leaving.   Started talking about engorgement for non-nursing Moms, and Mom not engaging, but on the phone.  GMOB told about Ice packs, cold cabbage leaves in bra, and supportive bra.  Mom can pump a little to relieve pressure if breasts very uncomfortable, but not to empty breasts.  GMOB acknowledged information.     Roberta Baker 04/19/2017, 9:14 AM

## 2017-05-03 NOTE — Progress Notes (Signed)
Patient has admission tomorrow at 0:00, no appt today Schedule not reflective of this.

## 2017-05-18 ENCOUNTER — Emergency Department (HOSPITAL_COMMUNITY)
Admission: EM | Admit: 2017-05-18 | Discharge: 2017-05-18 | Disposition: A | Discharge status: Home / Self Care | Payer: Medicaid Other | Attending: Emergency Medicine | Admitting: Emergency Medicine

## 2017-05-18 ENCOUNTER — Encounter (HOSPITAL_COMMUNITY): Payer: Self-pay | Admitting: Emergency Medicine

## 2017-05-18 DIAGNOSIS — Z79899 Other long term (current) drug therapy: Secondary | ICD-10-CM | POA: Diagnosis not present

## 2017-05-18 DIAGNOSIS — K0889 Other specified disorders of teeth and supporting structures: Secondary | ICD-10-CM | POA: Insufficient documentation

## 2017-05-18 DIAGNOSIS — F17210 Nicotine dependence, cigarettes, uncomplicated: Secondary | ICD-10-CM | POA: Diagnosis not present

## 2017-05-18 MED ORDER — IBUPROFEN 600 MG PO TABS: 600.0000 mg | ORAL_TABLET | Freq: Four times a day (QID) | ORAL | 0 refills | Status: DC | PRN

## 2017-05-18 MED ORDER — ONDANSETRON HCL 4 MG PO TABS
4.0000 mg | ORAL_TABLET | Freq: Once | ORAL | Status: AC
  Administered 2017-05-18: 4 mg via ORAL
  Filled 2017-05-18: qty 1

## 2017-05-18 MED ORDER — TRAMADOL HCL 50 MG PO TABS
100.0000 mg | ORAL_TABLET | Freq: Once | ORAL | Status: AC
  Administered 2017-05-18: 100 mg via ORAL
  Filled 2017-05-18: qty 2

## 2017-05-18 MED ORDER — IBUPROFEN 400 MG PO TABS
400.0000 mg | ORAL_TABLET | Freq: Once | ORAL | Status: AC
  Administered 2017-05-18: 400 mg via ORAL
  Filled 2017-05-18: qty 1

## 2017-05-18 MED ORDER — TRAMADOL HCL 50 MG PO TABS: 50.0000 mg | ORAL_TABLET | Freq: Four times a day (QID) | ORAL | 0 refills | Status: DC | PRN

## 2017-05-18 MED ORDER — AMOXICILLIN 250 MG PO CAPS
500.0000 mg | ORAL_CAPSULE | Freq: Once | ORAL | Status: AC
  Administered 2017-05-18: 500 mg via ORAL
  Filled 2017-05-18: qty 2

## 2017-05-18 MED ORDER — AMOXICILLIN 500 MG PO CAPS: 500.0000 mg | ORAL_CAPSULE | Freq: Three times a day (TID) | ORAL | 0 refills | Status: DC

## 2017-05-18 NOTE — ED Triage Notes (Signed)
Patient c/o right upper dental pain that radiates into jaw ,started today. Denies any fevers. Patient states taking tylenol with no relief-last dose "4 hours ago."

## 2017-05-18 NOTE — ED Provider Notes (Signed)
[facility] Provider Note   CSN: [id number] Arrival date & time: 05/18/17  1725     History   Chief Complaint Chief Complaint  Patient presents with  . Dental Pain    HPI Roberta Baker is a 31 y.o. female.  The history is provided by the patient.  Dental Pain   This is a new problem. The current episode started more than 2 days ago. The problem occurs hourly. The problem has been gradually worsening. The pain is moderate. She has tried acetaminophen for the symptoms. The treatment provided no relief.    Past Medical History:  Diagnosis Date  . Anxiety   . Headache   . Infection    UTI  . Kidney infection   . Kidney stones   . Nausea and vomiting in pregnancy 02/07/2017  . Pain, dental 02/07/2017  . 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 Status post vacuum-assisted vaginal delivery 04/17/2017  . IUGR (intrauterine growth restriction) affecting care of mother 03/14/2017  . 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 Pain, dental 02/07/2017  . Supervision of high risk pregnancy, antepartum 12/12/2016  . Late and limited prenatal care 12/12/2016  . 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 Depression with anxiety 12/12/2016    Past Surgical History:  Procedure Laterality Date  . BREAST ENHANCEMENT SURGERY    . BREAST SURGERY      OB History    Gravida Para Term Preterm AB Living   0 0   SAB TAB Ectopic Multiple Live Births         0         Home Medications    Prior to Admission medications   Medication Sig Start Date End Date Taking? Authorizing Provider  acetaminophen (TYLENOL) 500 MG tablet Take 500 mg by mouth every 6 (six) hours as needed for moderate pain.    [provider]  acyclovir (ZOVIRAX) 400 MG tablet Take 1 tablet (400 mg total) by mouth 3 (three) times daily. 03/12/17   Cheral Marker, CNM  amoxicillin (AMOXIL) 500 MG capsule Take 1 capsule (500 mg total) by mouth 3  (three) times daily. 05/18/17   Ivery Quale, PA-C  flintstones complete (FLINTSTONES) 60 MG chewable tablet Chew 2 tablets by mouth daily.     [provider]  ibuprofen (ADVIL,MOTRIN) 600 MG tablet Take 1 tablet (600 mg total) by mouth every 6 (six) hours as needed. 05/18/17   Ivery Quale, PA-C  omeprazole (PRILOSEC) 20 MG capsule Take 1 capsule (20 mg total) by mouth daily. 1 tablet a day 03/21/17   Lazaro Arms, MD  promethazine (PHENERGAN) 25 MG tablet Take 1 tablet (25 mg total) by mouth every 6 (six) hours as needed for nausea or vomiting. 04/19/17   Allie Bossier, MD  traMADol (ULTRAM) 50 MG tablet Take 1 tablet (50 mg total) by mouth every 6 (six) hours as needed. 05/18/17   Ivery Quale, PA-C    Family History Family History  Problem Relation Age of Onset  . Mental illness Mother   . Cancer Maternal Uncle   . Cancer Maternal Grandmother        breast cancer  . Heart attack Maternal Grandfather     Social History Social History  Substance Use Topics  . Smoking status: Current Some Day Smoker    Packs/day: 0.25    Years: 0.50  Types: Cigarettes    Last attempt to quit: 01/14/2017  . Smokeless tobacco: Never Used  . Alcohol use No     Allergies   Sulfa antibiotics   Review of Systems Review of Systems  Constitutional: Negative for activity change.       All ROS Neg except as noted in HPI  HENT: Positive for dental problem. Negative for nosebleeds.   Eyes: Negative for photophobia and discharge.  Respiratory: Negative for cough, shortness of breath and wheezing.   Cardiovascular: Negative for chest pain and palpitations.  Gastrointestinal: Negative for abdominal pain and blood in stool.  Genitourinary: Negative for dysuria, frequency and hematuria.  Musculoskeletal: Negative for arthralgias, back pain and neck pain.  Skin: Negative.   Neurological: Negative for dizziness, seizures and speech difficulty.  Psychiatric/Behavioral: Negative for confusion  and hallucinations.     Physical Exam Updated Vital Signs BP 101/67 (BP Location: Right Arm)   Pulse 100   Temp 98.5 F (36.9 C) (Oral)   Resp 18   Ht 5' (1.524 m)   Wt 54.9 kg (121 lb)   SpO2 99%   Breastfeeding? No   BMI 23.63 kg/m   Physical Exam  Constitutional: She is oriented to person, place, and time. She appears well-developed and well-nourished.  Non-toxic appearance.  HENT:  Head: Normocephalic.  Right Ear: Tympanic membrane and external ear normal.  Left Ear: Tympanic membrane and external ear normal.  Tenderness to palpation/percussion of the upper right molar area. No visible abscess appreciated. Airway is patent. No swelling under the tongue.  Mild to moderate nasal congestion noted.  Eyes: Pupils are equal, round, and reactive to light. EOM and lids are normal.  Neck: Normal range of motion. Neck supple. Carotid bruit is not present.  Cardiovascular: Normal rate, regular rhythm, normal heart sounds, intact distal pulses and normal pulses.   Pulmonary/Chest: Breath sounds normal. No respiratory distress.  Abdominal: Soft. Bowel sounds are normal. There is no tenderness. There is no guarding.  Musculoskeletal: Normal range of motion.  Lymphadenopathy:       Head (right side): No submandibular adenopathy present.       Head (left side): No submandibular adenopathy present.    She has no cervical adenopathy.  Neurological: She is alert and oriented to person, place, and time. She has normal strength. No cranial nerve deficit or sensory deficit.  Skin: Skin is warm and dry.  Psychiatric: She has a normal mood and affect. Her speech is normal.  Nursing note and vitals reviewed.    ED Treatments / Results  Labs (all labs ordered are listed, but only abnormal results are displayed) Labs Reviewed - No data to display  EKG  EKG Interpretation None       Radiology No results found.  Procedures Procedures (including critical care time)  Medications  Ordered in ED Medications  amoxicillin (AMOXIL) capsule 500 mg (not administered)  ibuprofen (ADVIL,MOTRIN) tablet 400 mg (not administered)  traMADol (ULTRAM) tablet 100 mg (not administered)  ondansetron (ZOFRAN) tablet 4 mg (not administered)     Initial Impression / Assessment and Plan / ED Course  I have reviewed the triage vital signs and the nursing notes.  Pertinent labs & imaging results that were available during my care of the patient were reviewed by me and considered in my medical decision making (see chart for details).       Final Clinical Impressions(s) / ED Diagnoses MDM Vital signs within normal limits. There is pain to palpation/percussion of  the upper molar area on the right. No visible abscess noted. No unusual swelling. Patient able to mobilize secretions without problems. No evidence for Ludwig's Angina. Have given the patient dental resource information. I've asked her to see a dentist as sone as possible for additional evaluation and management. Prescription for Amoxil, ibuprofen, and Ultram given to the patient. Patient is in agreement with this plan.    Final diagnoses:  Pain, dental    New Prescriptions New Prescriptions   AMOXICILLIN (AMOXIL) 500 MG CAPSULE    Take 1 capsule (500 mg total) by mouth 3 (three) times daily.   IBUPROFEN (ADVIL,MOTRIN) 600 MG TABLET    Take 1 tablet (600 mg total) by mouth every 6 (six) hours as needed.   TRAMADOL (ULTRAM) 50 MG TABLET    Take 1 tablet (50 mg total) by mouth every 6 (six) hours as needed.     Ivery Quale, PA-C 05/18/17 Rowland Lathe, MD 05/18/17 [phone]

## 2017-05-18 NOTE — ED Notes (Signed)
Pt alert & oriented x4, stable gait. Patient given discharge instructions, paperwork & prescription(s). Patient  instructed to stop at the registration desk to finish any additional paperwork. Patient verbalized understanding. Pt left department w/ no further questions. 

## 2017-05-18 NOTE — Discharge Instructions (Signed)
Your vital signs within normal limits. Please see a dentist as sone as possible for appropriate x-rays and management of your pain. Use Amoxil 3 times daily with food. Use ibuprofen every 6 hours as needed for pain. May use Ultram for more severe pain. This medication may cause drowsiness. Please do not drink, drive, or participate in activity that requires concentration while taking this medication.

## 2017-05-18 NOTE — ED Notes (Signed)
C/o dental pain to right jaw, pt not sure if its upper or lower.  Rates pain 9/10.

## 2017-05-28 ENCOUNTER — Ambulatory Visit: Payer: Medicaid Other | Admitting: Advanced Practice Midwife

## 2017-05-30 ENCOUNTER — Ambulatory Visit: Payer: Medicaid Other | Admitting: Women's Health

## 2017-06-01 ENCOUNTER — Emergency Department (HOSPITAL_COMMUNITY)
Admission: EM | Admit: 2017-06-01 | Discharge: 2017-06-01 | Disposition: A | Discharge status: Home / Self Care | Payer: Medicaid Other | Attending: Emergency Medicine | Admitting: Emergency Medicine

## 2017-06-01 ENCOUNTER — Encounter (HOSPITAL_COMMUNITY): Payer: Self-pay | Admitting: Emergency Medicine

## 2017-06-01 DIAGNOSIS — Z79899 Other long term (current) drug therapy: Secondary | ICD-10-CM | POA: Diagnosis not present

## 2017-06-01 DIAGNOSIS — F17210 Nicotine dependence, cigarettes, uncomplicated: Secondary | ICD-10-CM | POA: Insufficient documentation

## 2017-06-01 DIAGNOSIS — K0889 Other specified disorders of teeth and supporting structures: Secondary | ICD-10-CM | POA: Insufficient documentation

## 2017-06-01 MED ORDER — CLINDAMYCIN HCL 150 MG PO CAPS: 150.0000 mg | ORAL_CAPSULE | Freq: Four times a day (QID) | ORAL | 0 refills | Status: DC

## 2017-06-01 MED ORDER — DICLOFENAC SODIUM 75 MG PO TBEC: 75.0000 mg | DELAYED_RELEASE_TABLET | Freq: Two times a day (BID) | ORAL | 0 refills | Status: DC

## 2017-06-01 NOTE — Discharge Instructions (Signed)
Follow-up with a dentist soon.   °

## 2017-06-01 NOTE — ED Triage Notes (Addendum)
Patient c/o right upper dental pain. Per patient was seen here in ED 2 weeks ago and given antibiotics. Patient states that pain started again 3 days ago. Patient states she has contacted her dentist but is not able to "get into office yet." Patient reports taking extra strength tylenol-last dose at 12pm today. Denies any fevers.

## 2017-06-01 NOTE — ED Provider Notes (Signed)
[facility] Provider Note   CSN: [id number] Arrival date & time: 06/01/17  1429     History   Chief Complaint Chief Complaint  Patient presents with  . Dental Pain    HPI Roberta Baker is a 31 y.o. female.   HPI   Roberta Baker is a 31 y.o. female who presents to the Emergency Department complaining of continued right upper dental pain.  She was seen here 2 weeks ago for same.  Has completed course of amoxil and taken tramadol and ibuprofen without relief.  She contacted her dentist but has been unable to get an appt.  Pain is worse with chewing and radiates toward her right ear.  She denies facial swelling, neck pain, difficulty swallowing or chewing.     Past Medical History:  Diagnosis Date  . Anxiety   . Headache   . Infection    UTI  . Kidney infection   . Kidney stones   . Nausea and vomiting in pregnancy 02/07/2017  . Pain, dental 02/07/2017  . 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 Status post vacuum-assisted vaginal delivery 04/17/2017  . IUGR (intrauterine growth restriction) affecting care of mother 03/14/2017  . 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 Pain, dental 02/07/2017  . Supervision of high risk pregnancy, antepartum 12/12/2016  . Late and limited prenatal care 12/12/2016  . 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 Depression with anxiety 12/12/2016    Past Surgical History:  Procedure Laterality Date  . BREAST ENHANCEMENT SURGERY    . BREAST SURGERY      OB History    Gravida Para Term Preterm AB Living   0 0   SAB TAB Ectopic Multiple Live Births         0         Home Medications    Prior to Admission medications   Medication Sig Start Date End Date Taking? Authorizing Provider  acetaminophen (TYLENOL) 500 MG tablet Take 500 mg by mouth every 6 (six) hours as needed for moderate pain.    [provider]  acyclovir (ZOVIRAX) 400 MG tablet Take 1 tablet  (400 mg total) by mouth 3 (three) times daily. 03/12/17   Cheral Marker, CNM  amoxicillin (AMOXIL) 500 MG capsule Take 1 capsule (500 mg total) by mouth 3 (three) times daily. 05/18/17   Ivery Quale, PA-C  flintstones complete (FLINTSTONES) 60 MG chewable tablet Chew 2 tablets by mouth daily.     [provider]  ibuprofen (ADVIL,MOTRIN) 600 MG tablet Take 1 tablet (600 mg total) by mouth every 6 (six) hours as needed. 05/18/17   Ivery Quale, PA-C  omeprazole (PRILOSEC) 20 MG capsule Take 1 capsule (20 mg total) by mouth daily. 1 tablet a day 03/21/17   Lazaro Arms, MD  promethazine (PHENERGAN) 25 MG tablet Take 1 tablet (25 mg total) by mouth every 6 (six) hours as needed for nausea or vomiting. 04/19/17   Allie Bossier, MD  traMADol (ULTRAM) 50 MG tablet Take 1 tablet (50 mg total) by mouth every 6 (six) hours as needed. 05/18/17   Ivery Quale, PA-C    Family History Family History  Problem Relation Age of Onset  . Mental illness Mother   . Cancer Maternal Uncle   . Cancer Maternal Grandmother        breast cancer  . Heart attack  Maternal Grandfather     Social History Social History  Substance Use Topics  . Smoking status: Current Some Day Smoker    Packs/day: 0.25    Years: 0.50    Types: Cigarettes    Last attempt to quit: 01/14/2017  . Smokeless tobacco: Never Used  . Alcohol use No     Allergies   Sulfa antibiotics   Review of Systems Review of Systems  Constitutional: Negative for appetite change and fever.  HENT: Positive for dental problem. Negative for congestion, facial swelling, sore throat and trouble swallowing.   Eyes: Negative for pain and visual disturbance.  Musculoskeletal: Negative for neck pain and neck stiffness.  Neurological: Negative for dizziness, facial asymmetry and headaches.  Hematological: Negative for adenopathy.  All other systems reviewed and are negative.    Physical Exam Updated Vital Signs BP 113/63 (BP  Location: Right Arm)   Pulse 89   Temp 97.9 F (36.6 C) (Oral)   Resp 18   Ht 5' (1.524 m)   Wt 54.9 kg (121 lb)   LMP 05/28/2017   SpO2 96%   Breastfeeding? No   BMI 23.63 kg/m   Physical Exam  Constitutional: She is oriented to person, place, and time. She appears well-developed and well-nourished. No distress.  HENT:  Head: Normocephalic and atraumatic.  Right Ear: Tympanic membrane and ear canal normal.  Left Ear: Tympanic membrane and ear canal normal.  Mouth/Throat: Uvula is midline, oropharynx is clear and moist and mucous membranes are normal. No trismus in the jaw. Dental caries present. No dental abscesses or uvula swelling.  Tenderness and dental caries of the right upper second premolar. No facial swelling, obvious dental abscess, trismus, or sublingual abnml.    Neck: Normal range of motion. Neck supple.  Cardiovascular: Normal rate, regular rhythm and normal heart sounds.   No murmur heard. Pulmonary/Chest: Effort normal and breath sounds normal.  Musculoskeletal: Normal range of motion.  Lymphadenopathy:    She has no cervical adenopathy.  Neurological: She is alert and oriented to person, place, and time. No sensory deficit. She exhibits normal muscle tone. Coordination normal.  Skin: Skin is warm and dry.  Nursing note and vitals reviewed.    ED Treatments / Results  Labs (all labs ordered are listed, but only abnormal results are displayed) Labs Reviewed - No data to display  EKG  EKG Interpretation None       Radiology No results found.  Procedures Procedures (including critical care time)  Medications Ordered in ED Medications - No data to display   Initial Impression / Assessment and Plan / ED Course  I have reviewed the triage vital signs and the nursing notes.  Pertinent labs & imaging results that were available during my care of the patient were reviewed by me and considered in my medical decision making (see chart for details).       Patient well-appearing. No obvious dental abscess. No concerning symptoms for Lawanda Cousins' angina.  Dental f/u  Final Clinical Impressions(s) / ED Diagnoses   Final diagnoses:  Pain, dental    New Prescriptions New Prescriptions   No medications on file     Pauline Aus, PA-C 06/01/17 1554    Pricilla Loveless, MD 06/02/17 [phone]

## 2017-06-07 ENCOUNTER — Encounter: Payer: Self-pay | Admitting: Women's Health

## 2017-06-07 ENCOUNTER — Encounter (INDEPENDENT_AMBULATORY_CARE_PROVIDER_SITE_OTHER): Payer: Self-pay

## 2017-06-07 ENCOUNTER — Ambulatory Visit (INDEPENDENT_AMBULATORY_CARE_PROVIDER_SITE_OTHER): Payer: Medicaid Other | Admitting: Women's Health

## 2017-06-07 NOTE — Progress Notes (Signed)
   [facility] Postpartum Visit  Patient name: Roberta Baker MRN [id number]  Date of birth: 1986-04-10 CC & HPI:  Roberta Baker is a 31 y.o. G60P1001 Caucasian female being seen today for a postpartum visit. She is 7 weeks postpartum following a vacuum, low at 39.1 gestational weeks after IOL for IUGR. Baby weighed 6lb 1.4oz. Anesthesia: epidural. I have fully reviewed the prenatal and intrapartum course. Postpartum course has been complicated by 2 visits to ED for dental pain and back pain. Reports back pain as Lt sided under scapula.   Baby's course has been uncomplicated. Baby is feeding by bottle.  Bleeding on period. Bowel function is normal. Bladder function is normal.  Patient is sexually active. Last sexual activity: ~2wks ago.  Contraception method is received depo 8/16 on d/c from hospital.  Postpartum depression screening: negative. Score 0.   Last pap 12/12/16.  Results were neg w/ -HRHPV .  Patient's last menstrual period was 05/28/2017. Review of Systems:   Denies Abnormal vaginal discharge w/ itching/odor/irritation, headaches, visual changes, shortness of breath, chest pain, abdominal pain, severe nausea/vomiting, or problems with urination or bowel movements. Pertinent History Reviewed:  Reviewed past medical,surgical and family history.  Reviewed problem list, medications and allergies. OB History  Gravida Para Term Preterm AB Living  0 1  SAB TAB Ectopic Multiple Live Births        0      # Outcome Date GA Lbr Len/2nd Weight Sex Delivery Anes PTL Lv  1 Term 04/17/17 [redacted]w[redacted]d 12:26 / 00:18 6 lb 1.4 oz (2.761 kg) M Vag-Vacuum EPI  LIV     Objective Findings:   Vitals:   06/07/17 1122  BP: 100/60  Pulse: 80  Weight: 121 lb 6.4 oz (55.1 kg)    Body mass index is 23.71 kg/m.  Physical Exam General:  alert, cooperative and no distress   Breasts:  deferred, no complaints  Lungs: clear to auscultation bilaterally  Heart:  regular rate and  rhythm  Abdomen: soft, nontender   Vulva: normal  Vagina: normal vagina  Cervix:  closed  Corpus: Well-involuted  Adnexa:  Non-palpable  Rectal Exam: No hemorrhoids        No results found for this or any previous visit (from the past 24 hour(s)).  Assessment & Plan:  1) Postpartum exam 2) 7 wks s/p SVB after IOL for IUGR 3) Bottlefeeding 4) Depression screening 5) Contraception counseling> received depo in hospital, due again 11/1  Return for 11/1 for depo, then after 4/11 for physical.   Marge Duncans CNM, [name] 06/07/2017 12:15 PM

## 2017-06-07 NOTE — Patient Instructions (Signed)
Musculoskeletal Pain  Musculoskeletal pain is muscle and bone aches and pains. This pain can occur in any part of the body.  Follow these instructions at home:  · Only take medicines for pain, discomfort, or fever as told by your health care provider.  · You may continue all activities unless the activities cause more pain. When the pain lessens, slowly resume normal activities. Gradually increase the intensity and duration of the activities or exercise.  · During periods of severe pain, bed rest may be helpful. Lie or sit in any position that is comfortable, but get out of bed and walk around at least every several hours.  · If directed, put ice on the injured area.  ? Put ice in a plastic bag.  ? Place a towel between your skin and the bag.  ? Leave the ice on for 20 minutes, 2-3 times a day.  Contact a health care provider if:  · Your pain is getting worse.  · Your pain is not relieved with medicines.  · You lose function in the area of the pain if the pain is in your arms, legs, or neck.  This information is not intended to replace advice given to you by your health care provider. Make sure you discuss any questions you have with your health care provider.  Document Released: 08/20/2005 Document Revised: 01/31/2016 Document Reviewed: 04/24/2013  Elsevier Interactive Patient Education © 2017 [employer]

## 2017-07-04 ENCOUNTER — Telehealth: Payer: Self-pay | Admitting: *Deleted

## 2017-07-04 ENCOUNTER — Other Ambulatory Visit: Payer: Self-pay | Admitting: Advanced Practice Midwife

## 2017-07-04 ENCOUNTER — Ambulatory Visit: Payer: Medicaid Other

## 2017-07-04 MED ORDER — MEDROXYPROGESTERONE ACETATE 150 MG/ML IM SUSP: 150.0000 mg | INTRAMUSCULAR | 3 refills | Status: DC

## 2017-07-04 NOTE — Telephone Encounter (Signed)
Informed patient prescription for Depo was sent to pharmacy. Verbalized understanding.

## 2017-07-04 NOTE — Progress Notes (Signed)
Depo rx w/3 refills

## 2017-07-04 NOTE — Telephone Encounter (Signed)
Pt had depo done before leaving hospital on 04-17-17. Pt has ov for tomorrow for injection. Can we send RX for depo? Postpartum visit was 06/07/17.

## 2017-07-04 NOTE — Telephone Encounter (Signed)
rx sent to pharmacy

## 2017-07-05 ENCOUNTER — Ambulatory Visit (INDEPENDENT_AMBULATORY_CARE_PROVIDER_SITE_OTHER): Payer: Medicaid Other | Admitting: *Deleted

## 2017-07-05 ENCOUNTER — Encounter: Payer: Self-pay | Admitting: *Deleted

## 2017-07-05 VITALS — Wt 124.0 lb

## 2017-07-05 DIAGNOSIS — Z3202 Encounter for pregnancy test, result negative: Secondary | ICD-10-CM | POA: Diagnosis not present

## 2017-07-05 DIAGNOSIS — Z3042 Encounter for surveillance of injectable contraceptive: Secondary | ICD-10-CM | POA: Diagnosis not present

## 2017-07-05 LAB — POCT URINE PREGNANCY: PREG TEST UR: NEGATIVE

## 2017-07-05 MED ORDER — MEDROXYPROGESTERONE ACETATE 150 MG/ML IM SUSP
150.0000 mg | Freq: Once | INTRAMUSCULAR | Status: AC
  Administered 2017-07-05: 150 mg via INTRAMUSCULAR

## 2017-07-05 NOTE — Progress Notes (Signed)
Depo Provera 150mg IM given in left deltoid with no complications. Pt to return in 12 weeks for next injection.  

## 2017-09-27 ENCOUNTER — Ambulatory Visit: Payer: Medicaid Other

## 2017-09-30 ENCOUNTER — Ambulatory Visit: Payer: Medicaid Other

## 2017-10-04 ENCOUNTER — Ambulatory Visit: Payer: Medicaid Other

## 2017-10-04 ENCOUNTER — Encounter: Payer: Self-pay | Admitting: *Deleted

## 2017-10-04 ENCOUNTER — Ambulatory Visit (INDEPENDENT_AMBULATORY_CARE_PROVIDER_SITE_OTHER): Payer: Medicaid Other | Admitting: *Deleted

## 2017-10-04 DIAGNOSIS — Z3042 Encounter for surveillance of injectable contraceptive: Secondary | ICD-10-CM

## 2017-10-04 DIAGNOSIS — Z3202 Encounter for pregnancy test, result negative: Secondary | ICD-10-CM

## 2017-10-04 DIAGNOSIS — Z308 Encounter for other contraceptive management: Secondary | ICD-10-CM

## 2017-10-04 LAB — POCT URINE PREGNANCY: PREG TEST UR: NEGATIVE

## 2017-10-04 MED ORDER — MEDROXYPROGESTERONE ACETATE 150 MG/ML IM SUSP
150.0000 mg | Freq: Once | INTRAMUSCULAR | Status: AC
  Administered 2017-10-04: 150 mg via INTRAMUSCULAR

## 2017-10-04 NOTE — Progress Notes (Signed)
Pt here for Depo. Pt tolerated shot well. Return in 12 weeks for next shot. JSY 

## 2017-12-27 ENCOUNTER — Other Ambulatory Visit: Payer: Self-pay

## 2017-12-27 ENCOUNTER — Encounter (HOSPITAL_COMMUNITY): Payer: Self-pay | Admitting: *Deleted

## 2017-12-27 ENCOUNTER — Ambulatory Visit: Payer: Medicaid Other

## 2017-12-27 ENCOUNTER — Emergency Department (HOSPITAL_COMMUNITY)
Admission: EM | Admit: 2017-12-27 | Discharge: 2017-12-27 | Disposition: A | Discharge status: Home / Self Care | Payer: Medicaid Other | Attending: Emergency Medicine | Admitting: Emergency Medicine

## 2017-12-27 ENCOUNTER — Encounter: Payer: Self-pay | Admitting: Obstetrics & Gynecology

## 2017-12-27 DIAGNOSIS — K029 Dental caries, unspecified: Secondary | ICD-10-CM | POA: Diagnosis not present

## 2017-12-27 DIAGNOSIS — R000 Tachycardia, unspecified: Secondary | ICD-10-CM | POA: Diagnosis not present

## 2017-12-27 DIAGNOSIS — Z79899 Other long term (current) drug therapy: Secondary | ICD-10-CM | POA: Insufficient documentation

## 2017-12-27 DIAGNOSIS — F17210 Nicotine dependence, cigarettes, uncomplicated: Secondary | ICD-10-CM | POA: Insufficient documentation

## 2017-12-27 DIAGNOSIS — K0889 Other specified disorders of teeth and supporting structures: Secondary | ICD-10-CM | POA: Diagnosis present

## 2017-12-27 MED ORDER — CLINDAMYCIN HCL 150 MG PO CAPS
300.0000 mg | ORAL_CAPSULE | Freq: Once | ORAL | Status: AC
  Administered 2017-12-27: 300 mg via ORAL
  Filled 2017-12-27: qty 2

## 2017-12-27 MED ORDER — CLINDAMYCIN HCL 150 MG PO CAPS: ORAL_CAPSULE | ORAL | 0 refills | Status: DC

## 2017-12-27 MED ORDER — IBUPROFEN 600 MG PO TABS: 600.0000 mg | ORAL_TABLET | Freq: Four times a day (QID) | ORAL | 0 refills | Status: DC

## 2017-12-27 MED ORDER — TRAMADOL HCL 50 MG PO TABS
100.0000 mg | ORAL_TABLET | Freq: Once | ORAL | Status: AC
Start: 2017-12-27 — End: 2017-12-27
  Administered 2017-12-27: 100 mg via ORAL
  Filled 2017-12-27: qty 2

## 2017-12-27 MED ORDER — ONDANSETRON HCL 4 MG PO TABS
4.0000 mg | ORAL_TABLET | Freq: Once | ORAL | Status: AC
  Administered 2017-12-27: 4 mg via ORAL
  Filled 2017-12-27: qty 1

## 2017-12-27 MED ORDER — IBUPROFEN 400 MG PO TABS
400.0000 mg | ORAL_TABLET | Freq: Once | ORAL | Status: AC
Start: 2017-12-27 — End: 2017-12-27
  Administered 2017-12-27: 400 mg via ORAL
  Filled 2017-12-27: qty 1

## 2017-12-27 MED ORDER — TRAMADOL HCL 50 MG PO TABS: ORAL_TABLET | ORAL | 0 refills | Status: DC

## 2017-12-27 NOTE — ED Provider Notes (Signed)
[facility] EMERGENCY DEPARTMENT Provider Note   CSN: [id number] Arrival date & time: 12/27/17  0845     History   Chief Complaint Chief Complaint  Patient presents with  . Dental Pain    HPI Velna HatchetJacqueline M Garfinkel is a 32 y.o. female.  Patient is a 32 year old female who presents to the emergency department with a complaint of dental pain.  The patient states she is seeing a dentist.  Over the last month she has been having some dental work done.  She states however that she now has pain on the right side of her mouth.  This is been going on for the last 4 days.  She has been trying conservative measures including Tylenol aspirin and ibuprofen and they are not helping.  The pain seems to be advancing from her jaw on the right up toward her ear.  She states that it is affecting her talking, as well as her eating.  She has no problems breathing and no problems swallowing.  The history is provided by the patient.    Past Medical History:  Diagnosis Date  . Anxiety   . Headache   . Infection    UTI  . Kidney infection   . Kidney stones   . Nausea and vomiting in pregnancy 02/07/2017  . Pain, dental 02/07/2017  . 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 History of prior pregnancy with IUGR newborn 03/14/2017  . 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 Pain, dental 02/07/2017  . 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 Depression with anxiety 12/12/2016    Past Surgical History:  Procedure Laterality Date  . BREAST ENHANCEMENT SURGERY    . BREAST SURGERY       OB History    Gravida  1   Para  1   Term  1   Preterm      AB  0   Living  1     SAB      TAB      Ectopic      Multiple  0   Live Births               Home Medications    Prior to Admission medications   Medication Sig Start Date End Date Taking? Authorizing Provider  aspirin-acetaminophen-caffeine (EXCEDRIN MIGRAINE) [phone] MG tablet Take 1  tablet by mouth as needed for headache.    [provider]  medroxyPROGESTERone (DEPO-PROVERA) 150 MG/ML injection Inject 1 mL (150 mg total) into the muscle every 3 (three) months. 07/04/17   Jacklyn Shellresenzo-Dishmon, Frances, CNM    Family History Family History  Problem Relation Age of Onset  . Mental illness Mother   . Cancer Maternal Uncle   . Cancer Maternal Grandmother        breast cancer  . Heart attack Maternal Grandfather     Social History Social History   Tobacco Use  . Smoking status: Current Some Day Smoker    Packs/day: 0.25    Years: 0.50    Pack years: 0.12    Types: Cigarettes    Last attempt to quit: 01/14/2017    Years since quitting: 0.9  . Smokeless tobacco: Never Used  Substance Use Topics  . Alcohol use: No  . Drug use: No     Allergies   Sulfa antibiotics   Review of Systems Review of Systems  Constitutional: Negative for activity change.  All ROS Neg except as noted in HPI  HENT: Positive for dental problem. Negative for nosebleeds.   Eyes: Negative for photophobia and discharge.  Respiratory: Negative for cough, shortness of breath and wheezing.   Cardiovascular: Negative for chest pain and palpitations.  Gastrointestinal: Negative for abdominal pain and blood in stool.  Genitourinary: Negative for dysuria, frequency and hematuria.  Musculoskeletal: Negative for arthralgias, back pain and neck pain.  Skin: Negative.   Neurological: Positive for headaches. Negative for dizziness, seizures and speech difficulty.  Psychiatric/Behavioral: Negative for confusion and hallucinations.     Physical Exam Updated Vital Signs BP 120/84 (BP Location: Right Arm)   Pulse (!) 116   Temp 98.5 F (36.9 C) (Oral)   Resp 20   Ht 5' (1.524 m)   Wt 54 kg (119 lb)   LMP 12/20/2017   SpO2 100%   BMI 23.24 kg/m   Physical Exam  Constitutional: She is oriented to person, place, and time. She appears well-developed and well-nourished.   Non-toxic appearance.  HENT:  Head: Normocephalic.  Right Ear: Tympanic membrane and external ear normal.  Left Ear: Tympanic membrane and external ear normal.  Patient has multiple dental caries present.  She has more pain to palpation/percussion over the upper premolar on the right.  There is some swelling of the gum in that area.  No visible abscess noted.  The airway is patent.  There is no swelling under the tongue.  Eyes: Pupils are equal, round, and reactive to light. EOM and lids are normal.  Neck: Normal range of motion. Neck supple. Carotid bruit is not present.  Cardiovascular: Regular rhythm, normal heart sounds, intact distal pulses and normal pulses. Tachycardia present.  No murmur heard. Pulmonary/Chest: Breath sounds normal. No respiratory distress.  Abdominal: Soft. Bowel sounds are normal. There is no tenderness. There is no guarding.  Musculoskeletal: Normal range of motion.  Lymphadenopathy:       Head (right side): No submandibular adenopathy present.       Head (left side): No submandibular adenopathy present.    She has no cervical adenopathy.  Neurological: She is alert and oriented to person, place, and time. She has normal strength. No cranial nerve deficit or sensory deficit.  Skin: Skin is warm and dry.  Psychiatric: She has a normal mood and affect. Her speech is normal.  Nursing note and vitals reviewed.    ED Treatments / Results  Labs (all labs ordered are listed, but only abnormal results are displayed) Labs Reviewed - No data to display  EKG None  Radiology No results found.  Procedures Procedures (including critical care time)  Medications Ordered in ED Medications - No data to display   Initial Impression / Assessment and Plan / ED Course  I have reviewed the triage vital signs and the nursing notes.  Pertinent labs & imaging results that were available during my care of the patient were reviewed by me and considered in my medical  decision making (see chart for details).       Final Clinical Impressions(s) / ED Diagnoses  MDm  Vital signs reviewed.  Patient noted to have multiple dental caries present, but pain and some swelling in the premolar area of the right upper jaw.  There is no evidence of Ludwig's angina or other emergent changes.  The patient will be treated with clindamycin and ibuprofen.  I have asked the patient to contact her dentist to inform him of the severity of her pain and its  effect on some of her activities of daily living.  The patient is in agreement with this plan. Patient given 10 tablets of Ultram for assistance with severe pain.   Final diagnoses:  Dental caries  Pain, dental    ED Discharge Orders        Ordered    clindamycin (CLEOCIN) 150 MG capsule     12/27/17 0939    ibuprofen (ADVIL,MOTRIN) 600 MG tablet  4 times daily     12/27/17 0939    traMADol (ULTRAM) 50 MG tablet     12/27/17 0939       Ivery Quale, PA-C 12/27/17 1610    Mancel Bale, MD 12/28/17 [phone]

## 2017-12-27 NOTE — ED Notes (Signed)
Pt given warm blanket.

## 2017-12-27 NOTE — Discharge Instructions (Addendum)
Please call your dentist to see if it will be possible for you to be seen earlier instead of later due to the severity of your dental pain.  Please use clindamycin 2 times daily with food.  Please use ibuprofen with breakfast, lunch, dinner, and at bedtime for swelling, inflammation and pain.  Use Ultram every 6 hours as needed for more severe pain.  This medication may cause drowsiness, please do not drive a vehicle, operate machinery, drink alcohol, or participate in activities requiring concentration when taking this medication.

## 2017-12-27 NOTE — ED Triage Notes (Signed)
Pt c/o right side mouth pain for the last 3-4 days; pt states the pain is so severe she is unable to eat, drink or rest

## 2017-12-28 ENCOUNTER — Encounter (HOSPITAL_COMMUNITY): Payer: Self-pay

## 2017-12-28 ENCOUNTER — Emergency Department (HOSPITAL_COMMUNITY)
Admission: EM | Admit: 2017-12-28 | Discharge: 2017-12-28 | Disposition: A | Discharge status: Home / Self Care | Payer: Medicaid Other | Attending: Emergency Medicine | Admitting: Emergency Medicine

## 2017-12-28 ENCOUNTER — Other Ambulatory Visit: Payer: Self-pay

## 2017-12-28 DIAGNOSIS — F1290 Cannabis use, unspecified, uncomplicated: Secondary | ICD-10-CM | POA: Diagnosis not present

## 2017-12-28 DIAGNOSIS — F17210 Nicotine dependence, cigarettes, uncomplicated: Secondary | ICD-10-CM | POA: Diagnosis not present

## 2017-12-28 DIAGNOSIS — R519 Headache, unspecified: Secondary | ICD-10-CM

## 2017-12-28 DIAGNOSIS — K047 Periapical abscess without sinus: Secondary | ICD-10-CM | POA: Diagnosis not present

## 2017-12-28 DIAGNOSIS — K029 Dental caries, unspecified: Secondary | ICD-10-CM

## 2017-12-28 DIAGNOSIS — Z79899 Other long term (current) drug therapy: Secondary | ICD-10-CM | POA: Insufficient documentation

## 2017-12-28 DIAGNOSIS — F329 Major depressive disorder, single episode, unspecified: Secondary | ICD-10-CM | POA: Insufficient documentation

## 2017-12-28 DIAGNOSIS — F419 Anxiety disorder, unspecified: Secondary | ICD-10-CM | POA: Diagnosis not present

## 2017-12-28 DIAGNOSIS — R51 Headache: Secondary | ICD-10-CM | POA: Insufficient documentation

## 2017-12-28 MED ORDER — PROMETHAZINE HCL 12.5 MG PO TABS: 12.5000 mg | ORAL_TABLET | Freq: Four times a day (QID) | ORAL | 0 refills | Status: DC | PRN

## 2017-12-28 MED ORDER — KETOROLAC TROMETHAMINE 30 MG/ML IJ SOLN
30.0000 mg | Freq: Once | INTRAMUSCULAR | Status: AC
Start: 2017-12-28 — End: 2017-12-28
  Administered 2017-12-28: 30 mg via INTRAVENOUS
  Filled 2017-12-28: qty 1

## 2017-12-28 MED ORDER — SODIUM CHLORIDE 0.9 % IV BOLUS
1000.0000 mL | Freq: Once | INTRAVENOUS | Status: AC
  Administered 2017-12-28: 1000 mL via INTRAVENOUS

## 2017-12-28 MED ORDER — PROCHLORPERAZINE EDISYLATE 10 MG/2ML IJ SOLN
10.0000 mg | Freq: Once | INTRAMUSCULAR | Status: AC
  Administered 2017-12-28: 10 mg via INTRAVENOUS
  Filled 2017-12-28: qty 2

## 2017-12-28 MED ORDER — DIPHENHYDRAMINE HCL 50 MG/ML IJ SOLN
25.0000 mg | Freq: Once | INTRAMUSCULAR | Status: AC
  Administered 2017-12-28: 25 mg via INTRAVENOUS
  Filled 2017-12-28: qty 1

## 2017-12-28 MED ORDER — CEFAZOLIN SODIUM-DEXTROSE 1-4 GM/50ML-% IV SOLN
1.0000 g | Freq: Once | INTRAVENOUS | Status: AC
  Administered 2017-12-28: 1 g via INTRAVENOUS
  Filled 2017-12-28: qty 50

## 2017-12-28 NOTE — ED Provider Notes (Signed)
[facility] EMERGENCY DEPARTMENT Provider Note   CSN: [id number] Arrival date & time: 12/28/17  0750     History   Chief Complaint Chief Complaint  Patient presents with  . Emesis    HPI Roberta Baker is a 32 y.o. female.  Pt is a 32 y/o female who presents to the ED with c/o headache and vomiting and dental pain. Vomiting and headache started after taking 2nd dose of Clindamycin. Pt reports hx of migraine headaches. She states she had completed a meal before the antibiotics. The patient continues to have right-sided facial pain and right-sided dental area pain.  The headache however is in the temporal areas where she usually has her migraines.  No vision changes reported, no difficulty using extremities, no difficulty with walking or coordination.     Past Medical History:  Diagnosis Date  . Anxiety   . Headache   . Infection    UTI  . Kidney infection   . Kidney stones   . Nausea and vomiting in pregnancy 02/07/2017  . Pain, dental 02/07/2017  . 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 History of prior pregnancy with IUGR newborn 03/14/2017  . 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 Pain, dental 02/07/2017  . 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 Depression with anxiety 12/12/2016    Past Surgical History:  Procedure Laterality Date  . BREAST ENHANCEMENT SURGERY    . BREAST SURGERY       OB History    Gravida  1   Para  1   Term  1   Preterm      AB  0   Living  1     SAB      TAB      Ectopic      Multiple  0   Live Births               Home Medications    Prior to Admission medications   Medication Sig Start Date End Date Taking? Authorizing Provider  aspirin-acetaminophen-caffeine (EXCEDRIN MIGRAINE) [phone] MG tablet Take 1 tablet by mouth as needed for headache.    [provider]  clindamycin (CLEOCIN) 150 MG capsule 2 po bid with food 12/27/17   Ivery Quale, PA-C    ibuprofen (ADVIL,MOTRIN) 600 MG tablet Take 1 tablet (600 mg total) by mouth 4 (four) times daily. 12/27/17   Ivery Quale, PA-C  medroxyPROGESTERone (DEPO-PROVERA) 150 MG/ML injection Inject 1 mL (150 mg total) into the muscle every 3 (three) months. 07/04/17   Cresenzo-Dishmon, Scarlette Calico, CNM  traMADol Janean Sark) 50 MG tablet 1 or 2 po q6h prn pain 12/27/17   Ivery Quale, PA-C    Family History Family History  Problem Relation Age of Onset  . Mental illness Mother   . Cancer Maternal Uncle   . Cancer Maternal Grandmother        breast cancer  . Heart attack Maternal Grandfather     Social History Social History   Tobacco Use  . Smoking status: Current Some Day Smoker    Packs/day: 0.25    Years: 0.50    Pack years: 0.12    Types: Cigarettes    Last attempt to quit: 01/14/2017    Years since quitting: 0.9  . Smokeless tobacco: Never Used  Substance Use Topics  . Alcohol use: No  . Drug use: No     Allergies  Sulfa antibiotics   Review of Systems Review of Systems  Constitutional: Positive for appetite change. Negative for activity change.       All ROS Neg except as noted in HPI  HENT: Positive for dental problem. Negative for nosebleeds.   Eyes: Negative for photophobia and discharge.  Respiratory: Negative for cough, shortness of breath and wheezing.   Cardiovascular: Negative for chest pain and palpitations.  Gastrointestinal: Positive for nausea and vomiting. Negative for abdominal pain and blood in stool.  Genitourinary: Negative for dysuria, frequency and hematuria.  Musculoskeletal: Negative for arthralgias, back pain and neck pain.  Skin: Negative.   Neurological: Positive for headaches. Negative for dizziness, seizures and speech difficulty.  Psychiatric/Behavioral: Negative for confusion and hallucinations.     Physical Exam Updated Vital Signs BP 90/80 (BP Location: Right Arm)   Pulse 89   Temp 98.3 F (36.8 C) (Oral)   Resp 16   Ht 5' (1.524  m)   Wt 54 kg (119 lb)   LMP 12/20/2017   SpO2 97%   Breastfeeding? No   BMI 23.24 kg/m   Physical Exam  Constitutional: She appears well-developed and well-nourished. No distress.  HENT:  Head: Normocephalic and atraumatic.  Right Ear: External ear normal.  Left Ear: External ear normal.  No significant facial asymmetry noted.  There is tenderness to percussion over the maxillary area over the right premolar area.  No visible abscess noted.  The airway is patent.  There is no swelling under the tongue.  Speech is clear.  No trismus noted.  Eyes: Conjunctivae are normal. Right eye exhibits no discharge. Left eye exhibits no discharge. No scleral icterus.  Neck: Neck supple. No tracheal deviation present.  Cardiovascular: Normal rate, regular rhythm and intact distal pulses.  Pulmonary/Chest: Effort normal and breath sounds normal. No stridor. No respiratory distress. She has no wheezes. She has no rales.  Abdominal: Soft. Bowel sounds are normal. She exhibits no distension. There is no tenderness. There is no rebound and no guarding.  Musculoskeletal: She exhibits no edema or tenderness.  Neurological: She is alert. She has normal strength. No cranial nerve deficit (no facial droop, extraocular movements intact, no slurred speech) or sensory deficit. She exhibits normal muscle tone. She displays no seizure activity. Coordination normal.  Gait is steady.  Grip is symmetrical.  No gross neurologic deficits appreciated on examination.  Skin: Skin is warm and dry. No rash noted.  Psychiatric: She has a normal mood and affect.  Nursing note and vitals reviewed.    ED Treatments / Results  Labs (all labs ordered are listed, but only abnormal results are displayed) Labs Reviewed - No data to display  EKG None  Radiology No results found.  Procedures Procedures (including critical care time)  Medications Ordered in ED Medications  ketorolac (TORADOL) 30 MG/ML injection 30 mg (has  no administration in time range)  prochlorperazine (COMPAZINE) injection 10 mg (has no administration in time range)  diphenhydrAMINE (BENADRYL) injection 25 mg (has no administration in time range)  sodium chloride 0.9 % bolus 1,000 mL (has no administration in time range)     Initial Impression / Assessment and Plan / ED Course  I have reviewed the triage vital signs and the nursing notes.  Pertinent labs & imaging results that were available during my care of the patient were reviewed by me and considered in my medical decision making (see chart for details).       Final Clinical Impressions(s) / ED Diagnoses  MDM  Vital signs reviewed.  Patient states that she developed a headache and vomiting after the second dose of antibiotics.  She states that she usually takes an anti-medic when she takes an antibiotic but this time she did not mention to the provider that she needed an anti-medic because she thought it would be okay with just taking the medicine with food.  The patient has a history of migraine headaches.  She was diagnosed on yesterday April 26 with dental cary/dental infection.  The patient will be treated with IV fluids, headache cocktail including Toradol, Compazine, and Benadryl.  After improvement in headache and nausea, will consider IV antibiotics.  10:14 AM.  Patient states the headache is feeling much better almost completely resolved.  Patient states she continues to have pain over the right upper premolar tooth area, but that is improving as well.  No vomiting since receiving the medication, and nausea is much improved.  IV Ancef given to the patient. Pt tolerated this without problem  Pt to be discharged home with Rx for promethazine. Will change clindamycin to  qid instead of  bid. Pt encouraged to see her dentist as soon as possible.   Final diagnoses:  Bad headache  Infected dental caries    ED Discharge Orders        Ordered    promethazine  (PHENERGAN) 12.5 MG tablet  Every 6 hours PRN     12/28/17 1056       Ivery Quale, PA-C 12/28/17 1110    Linwood Dibbles, MD 12/29/17 [phone]

## 2017-12-28 NOTE — ED Triage Notes (Signed)
Pt states was here yesterday for oral pain and prescribed antibiotics.  Pt says after taking the 2nd dose of antibiotics, she started having a migraine and has vomited multiple times.

## 2017-12-28 NOTE — ED Notes (Signed)
ED Provider at bedside. 

## 2017-12-28 NOTE — Discharge Instructions (Addendum)
There is a good possibility that your headache is related to your dental pain/infection.  This may also be the source of your nausea/vomiting.  Please change her clindamycin to 1 tablet with breakfast, lunch, dinner, and at bedtime instead of the 2 tablets twice a day.  Use promethazine/Phenergan every 6 hours as needed for nausea.  Continue your current medications.  The antibiotics are only a temporary help for the dental infection and pain.  It is important that she see the dentist soon as possible.

## 2018-01-16 ENCOUNTER — Other Ambulatory Visit: Payer: Medicaid Other

## 2018-01-16 ENCOUNTER — Ambulatory Visit (INDEPENDENT_AMBULATORY_CARE_PROVIDER_SITE_OTHER): Payer: Medicaid Other | Admitting: *Deleted

## 2018-01-16 DIAGNOSIS — Z3042 Encounter for surveillance of injectable contraceptive: Secondary | ICD-10-CM

## 2018-01-16 LAB — BETA HCG QUANT (REF LAB): hCG Quant: <1 m[IU]/mL

## 2018-01-16 MED ORDER — MEDROXYPROGESTERONE ACETATE 150 MG/ML IM SUSP
150.0000 mg | Freq: Once | INTRAMUSCULAR | Status: AC
  Administered 2018-01-16: 150 mg via INTRAMUSCULAR

## 2018-01-16 NOTE — Progress Notes (Signed)
Pt in for depo injection She was due on April 19-May3. She has a negative hcg drawn this morning. Depo Provera 150 mg given IM in left deltoid.

## 2018-04-10 ENCOUNTER — Ambulatory Visit: Payer: Medicaid Other

## 2018-04-10 ENCOUNTER — Encounter: Payer: Self-pay | Admitting: *Deleted

## 2018-06-08 ENCOUNTER — Other Ambulatory Visit: Payer: Self-pay

## 2018-06-08 ENCOUNTER — Encounter (HOSPITAL_COMMUNITY): Payer: Self-pay | Admitting: Emergency Medicine

## 2018-06-08 ENCOUNTER — Emergency Department (HOSPITAL_COMMUNITY)
Admission: EM | Admit: 2018-06-08 | Discharge: 2018-06-08 | Disposition: A | Discharge status: Home / Self Care | Payer: Medicaid Other | Attending: Emergency Medicine | Admitting: Emergency Medicine

## 2018-06-08 DIAGNOSIS — F17210 Nicotine dependence, cigarettes, uncomplicated: Secondary | ICD-10-CM | POA: Diagnosis not present

## 2018-06-08 DIAGNOSIS — Z79899 Other long term (current) drug therapy: Secondary | ICD-10-CM | POA: Insufficient documentation

## 2018-06-08 DIAGNOSIS — K0889 Other specified disorders of teeth and supporting structures: Secondary | ICD-10-CM | POA: Insufficient documentation

## 2018-06-08 LAB — URINALYSIS, ROUTINE W REFLEX MICROSCOPIC
BILIRUBIN URINE: NEGATIVE
Bacteria, UA: NONE SEEN
GLUCOSE, UA: NEGATIVE mg/dL
Ketones, ur: NEGATIVE mg/dL
LEUKOCYTES UA: NEGATIVE
Nitrite: NEGATIVE
Protein, ur: NEGATIVE mg/dL
SPECIFIC GRAVITY, URINE: 1.034 — AB (ref 1.005–1.030)
pH: 5 (ref 5.0–8.0)

## 2018-06-08 MED ORDER — PENICILLIN V POTASSIUM 500 MG PO TABS: 500.0000 mg | ORAL_TABLET | Freq: Three times a day (TID) | ORAL | 0 refills | Status: DC

## 2018-06-08 MED ORDER — KETOROLAC TROMETHAMINE 60 MG/2ML IM SOLN
60.0000 mg | Freq: Once | INTRAMUSCULAR | Status: AC
  Administered 2018-06-08: 60 mg via INTRAMUSCULAR
  Filled 2018-06-08: qty 2

## 2018-06-08 MED ORDER — PROMETHAZINE HCL 25 MG/ML IJ SOLN
25.0000 mg | Freq: Once | INTRAMUSCULAR | Status: AC
  Administered 2018-06-08: 25 mg via INTRAMUSCULAR
  Filled 2018-06-08: qty 1

## 2018-06-08 MED ORDER — PENICILLIN V POTASSIUM 250 MG PO TABS
500.0000 mg | ORAL_TABLET | Freq: Once | ORAL | Status: AC
  Administered 2018-06-08: 500 mg via ORAL
  Filled 2018-06-08: qty 2

## 2018-06-08 MED ORDER — IBUPROFEN 800 MG PO TABS: 800.0000 mg | ORAL_TABLET | Freq: Three times a day (TID) | ORAL | 0 refills | Status: DC

## 2018-06-08 MED ORDER — PROMETHAZINE HCL 25 MG PO TABS: 25.0000 mg | ORAL_TABLET | Freq: Four times a day (QID) | ORAL | 0 refills | Status: DC | PRN

## 2018-06-08 NOTE — ED Triage Notes (Signed)
Patient right upper and lower abd pain x2 days. Patient unsure of any fevers. Per patient woke this morning with nausea and vomiting.

## 2018-06-08 NOTE — ED Provider Notes (Signed)
[facility] EMERGENCY DEPARTMENT Provider Note   CSN: [id number] Arrival date & time: 06/08/18  1704     History   Chief Complaint Chief Complaint  Patient presents with  . Dental Pain    HPI Roberta Baker is a 32 y.o. female.   Dental Pain   This is a new problem. The current episode started 2 days ago. The problem occurs constantly. The problem has been gradually worsening. The pain is moderate. She has tried nothing for the symptoms. The treatment provided no relief.    Past Medical History:  Diagnosis Date  . Anxiety   . Headache   . Infection    UTI  . Kidney infection   . Kidney stones   . Nausea and vomiting in pregnancy 02/07/2017  . Pain, dental 02/07/2017  . 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 History of prior pregnancy with IUGR newborn 03/14/2017  . 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 Pain, dental 02/07/2017  . 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 Depression with anxiety 12/12/2016    Past Surgical History:  Procedure Laterality Date  . BREAST ENHANCEMENT SURGERY    . BREAST SURGERY       OB History    Gravida  1   Para  1   Term  1   Preterm      AB  0   Living  1     SAB      TAB      Ectopic      Multiple  0   Live Births               Home Medications    Prior to Admission medications   Medication Sig Start Date End Date Taking? Authorizing Provider  acetaminophen (TYLENOL) 500 MG tablet Take 1,000 mg by mouth every 6 (six) hours as needed for mild pain.   Yes [provider]  medroxyPROGESTERone (DEPO-PROVERA) 150 MG/ML injection Inject 1 mL (150 mg total) into the muscle every 3 (three) months. 07/04/17  Yes Cresenzo-Dishmon, Scarlette Calico, CNM  ibuprofen (ADVIL,MOTRIN) 800 MG tablet Take 1 tablet (800 mg total) by mouth 3 (three) times daily. 06/08/18   Avrohom Mckelvin, Barbara Cower, MD  penicillin v potassium (VEETID) 500 MG tablet Take 1 tablet (500 mg total) by mouth 3  (three) times daily. 06/08/18   Yuli Lanigan, Barbara Cower, MD  promethazine (PHENERGAN) 25 MG tablet Take 1 tablet (25 mg total) by mouth every 6 (six) hours as needed for nausea or vomiting. 06/08/18   Elidia Bonenfant, Barbara Cower, MD    Family History Family History  Problem Relation Age of Onset  . Mental illness Mother   . Cancer Maternal Uncle   . Cancer Maternal Grandmother        breast cancer  . Heart attack Maternal Grandfather     Social History Social History   Tobacco Use  . Smoking status: Current Some Day Smoker    Packs/day: 0.25    Years: 0.50    Pack years: 0.12    Types: Cigarettes    Last attempt to quit: 01/14/2017    Years since quitting: 1.3  . Smokeless tobacco: Never Used  Substance Use Topics  . Alcohol use: No  . Drug use: No     Allergies   Sulfa antibiotics   Review of Systems Review of Systems  Gastrointestinal: Positive for abdominal pain, nausea and vomiting.  All other  systems reviewed and are negative.    Physical Exam Updated Vital Signs BP 97/60   Pulse 87   Temp 98.3 F (36.8 C) (Oral)   Resp 18   Ht 5' (1.524 m)   Wt 52.2 kg   LMP 06/08/2018   SpO2 100%   BMI 22.46 kg/m   Physical Exam  Constitutional: She is oriented to person, place, and time. She appears well-developed and well-nourished.  HENT:  Head: Normocephalic and atraumatic.  Mouth/Throat: Mucous membranes are dry. Dental caries present.  Eyes: Conjunctivae and EOM are normal.  Neck: Normal range of motion.  Cardiovascular: Normal rate and regular rhythm.  Pulmonary/Chest: No stridor. No respiratory distress.  Abdominal: Soft. She exhibits no distension.  Musculoskeletal: Normal range of motion. She exhibits no edema or deformity.  Neurological: She is alert and oriented to person, place, and time. No cranial nerve deficit. Coordination normal.  Skin: Skin is warm and dry.  Nursing note and vitals reviewed.    ED Treatments / Results  Labs (all labs ordered are listed, but  only abnormal results are displayed) Labs Reviewed  URINALYSIS, ROUTINE W REFLEX MICROSCOPIC - Abnormal; Notable for the following components:      Result Value   APPearance HAZY (*)    Specific Gravity, Urine 1.034 (*)    Hgb urine dipstick SMALL (*)    All other components within normal limits    EKG None  Radiology No results found.  Procedures Procedures (including critical care time)  Medications Ordered in ED Medications  penicillin v potassium (VEETID) tablet 500 mg (500 mg Oral Given 06/08/18 1750)  promethazine (PHENERGAN) injection 25 mg (25 mg Intramuscular Given 06/08/18 1751)  ketorolac (TORADOL) injection 60 mg (60 mg Intramuscular Given 06/08/18 1750)     Initial Impression / Assessment and Plan / ED Course  I have reviewed the triage vital signs and the nursing notes.  Pertinent labs & imaging results that were available during my care of the patient were reviewed by me and considered in my medical decision making (see chart for details).     Patient with likely dental infection and postnasal drip is causing her nausea and vomiting.  Treated for both.  Tolerating p.o. and significant improved.  Will need to follow-up with dentist.  Final Clinical Impressions(s) / ED Diagnoses   Final diagnoses:  Pain, dental    ED Discharge Orders         Ordered    penicillin v potassium (VEETID) 500 MG tablet  3 times daily     06/08/18 2058    promethazine (PHENERGAN) 25 MG tablet  Every 6 hours PRN     06/08/18 2058    ibuprofen (ADVIL,MOTRIN) 800 MG tablet  3 times daily     06/08/18 2058           Amberlee Garvey, Barbara Cower, MD 06/08/18 2201

## 2018-06-09 ENCOUNTER — Emergency Department (HOSPITAL_COMMUNITY)
Admission: EM | Admit: 2018-06-09 | Discharge: 2018-06-09 | Disposition: A | Discharge status: Home / Self Care | Payer: Medicaid Other | Attending: Emergency Medicine | Admitting: Emergency Medicine

## 2018-06-09 ENCOUNTER — Encounter (HOSPITAL_COMMUNITY): Payer: Self-pay | Admitting: Emergency Medicine

## 2018-06-09 DIAGNOSIS — K0889 Other specified disorders of teeth and supporting structures: Secondary | ICD-10-CM | POA: Insufficient documentation

## 2018-06-09 DIAGNOSIS — T39395A Adverse effect of other nonsteroidal anti-inflammatory drugs [NSAID], initial encounter: Secondary | ICD-10-CM | POA: Diagnosis not present

## 2018-06-09 DIAGNOSIS — K2960 Other gastritis without bleeding: Secondary | ICD-10-CM | POA: Diagnosis not present

## 2018-06-09 DIAGNOSIS — F17210 Nicotine dependence, cigarettes, uncomplicated: Secondary | ICD-10-CM | POA: Insufficient documentation

## 2018-06-09 MED ORDER — GI COCKTAIL ~~LOC~~
30.0000 mL | Freq: Once | ORAL | Status: AC
  Administered 2018-06-09: 30 mL via ORAL
  Filled 2018-06-09: qty 30

## 2018-06-09 MED ORDER — KETOROLAC TROMETHAMINE 60 MG/2ML IM SOLN
60.0000 mg | Freq: Once | INTRAMUSCULAR | Status: AC
  Administered 2018-06-09: 60 mg via INTRAMUSCULAR
  Filled 2018-06-09: qty 2

## 2018-06-09 MED ORDER — FAMOTIDINE 20 MG PO TABS: 20.0000 mg | ORAL_TABLET | Freq: Two times a day (BID) | ORAL | 0 refills | Status: DC

## 2018-06-09 MED ORDER — ONDANSETRON HCL 4 MG PO TABS: 4.0000 mg | ORAL_TABLET | Freq: Four times a day (QID) | ORAL | 0 refills | Status: DC | PRN

## 2018-06-09 NOTE — ED Provider Notes (Signed)
[facility] EMERGENCY DEPARTMENT Provider Note   CSN: [id number] Arrival date & time: 06/09/18  1644     History   Chief Complaint Chief Complaint  Patient presents with  . Dental Problem    HPI Roberta Baker is a 32 y.o. female.  HPI Patient presents with 3 days of right upper and lower dental pain.  She was seen yesterday in the emergency department.  She has yet to see a dentist.  She states she has been taking increasing amounts of ibuprofen.  She typically takes ibuprofen 3 times daily for her chronic back pain.  She is been taking 800 mg of ibuprofen 5 times over the last 24 hours.  She states she has developed upper abdominal pain and some nausea.  No grossly bloody or melanotic stools. Past Medical History:  Diagnosis Date  . Anxiety   . Headache   . Infection    UTI  . Kidney infection   . Kidney stones   . Nausea and vomiting in pregnancy 02/07/2017  . Pain, dental 02/07/2017  . 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 History of prior pregnancy with IUGR newborn 03/14/2017  . 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 Pain, dental 02/07/2017  . 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 Depression with anxiety 12/12/2016    Past Surgical History:  Procedure Laterality Date  . BREAST ENHANCEMENT SURGERY    . BREAST SURGERY       OB History    Gravida  1   Para  1   Term  1   Preterm      AB  0   Living  1     SAB      TAB      Ectopic      Multiple  0   Live Births               Home Medications    Prior to Admission medications   Medication Sig Start Date End Date Taking? Authorizing Provider  acetaminophen (TYLENOL) 500 MG tablet Take 1,000 mg by mouth every 6 (six) hours as needed for mild pain.    [provider]  famotidine (PEPCID) 20 MG tablet Take 1 tablet (20 mg total) by mouth 2 (two) times daily. 06/09/18   Loren Racer, MD  ibuprofen (ADVIL,MOTRIN) 800 MG tablet  Take 1 tablet (800 mg total) by mouth 3 (three) times daily. 06/08/18   Mesner, Barbara Cower, MD  medroxyPROGESTERone (DEPO-PROVERA) 150 MG/ML injection Inject 1 mL (150 mg total) into the muscle every 3 (three) months. 07/04/17   Cresenzo-Dishmon, Scarlette Calico, CNM  ondansetron (ZOFRAN) 4 MG tablet Take 1 tablet (4 mg total) by mouth every 6 (six) hours as needed for nausea or vomiting. 06/09/18   Loren Racer, MD  penicillin v potassium (VEETID) 500 MG tablet Take 1 tablet (500 mg total) by mouth 3 (three) times daily. 06/08/18   Mesner, Barbara Cower, MD  promethazine (PHENERGAN) 25 MG tablet Take 1 tablet (25 mg total) by mouth every 6 (six) hours as needed for nausea or vomiting. 06/08/18   Mesner, Barbara Cower, MD    Family History Family History  Problem Relation Age of Onset  . Mental illness Mother   . Cancer Maternal Uncle   . Cancer Maternal Grandmother        breast cancer  . Heart attack Maternal Grandfather     Social History Social History  Tobacco Use  . Smoking status: Current Some Day Smoker    Packs/day: 0.25    Years: 0.50    Pack years: 0.12    Types: Cigarettes    Last attempt to quit: 01/14/2017    Years since quitting: 1.4  . Smokeless tobacco: Never Used  Substance Use Topics  . Alcohol use: No  . Drug use: No     Allergies   Sulfa antibiotics   Review of Systems Review of Systems  Constitutional: Negative for chills and fever.  HENT: Positive for dental problem. Negative for sore throat and trouble swallowing.   Respiratory: Negative for cough and shortness of breath.   Gastrointestinal: Positive for abdominal pain and nausea. Negative for blood in stool, constipation, diarrhea and vomiting.  Musculoskeletal: Negative for back pain, myalgias and neck pain.  Skin: Negative for rash and wound.  Neurological: Negative for dizziness, weakness, light-headedness, numbness and headaches.  All other systems reviewed and are negative.    Physical Exam Updated Vital Signs BP  (!) 122/96   Pulse 90   Temp 98.5 F (36.9 C) (Oral)   Resp 20   Ht 5' (1.524 m)   Wt 52 kg   LMP 06/08/2018   SpO2 97%   BMI 22.39 kg/m   Physical Exam  Constitutional: She is oriented to person, place, and time. She appears well-developed and well-nourished. No distress.  HENT:  Head: Normocephalic and atraumatic.  Mouth/Throat: Oropharynx is clear and moist. No oropharyngeal exudate.    Eyes: Pupils are equal, round, and reactive to light. EOM are normal.  Neck: Normal range of motion. Neck supple. No JVD present.  Cardiovascular: Normal rate.  Pulmonary/Chest: Effort normal and breath sounds normal.  Abdominal: Soft. Bowel sounds are normal. There is tenderness. There is no rebound and no guarding.  Mild epigastric tenderness to palpation.  Musculoskeletal: Normal range of motion. She exhibits no edema or tenderness.  No CVA tenderness bilaterally.  Lymphadenopathy:    She has no cervical adenopathy.  Neurological: She is alert and oriented to person, place, and time.  Moving all extremities without focal deficit.  Sensation intact.  Skin: Skin is warm and dry. Capillary refill takes less than 2 seconds. No rash noted. She is not diaphoretic. No erythema.  Psychiatric: She has a normal mood and affect. Her behavior is normal.  Nursing note and vitals reviewed.    ED Treatments / Results  Labs (all labs ordered are listed, but only abnormal results are displayed) Labs Reviewed - No data to display  EKG None  Radiology No results found.  Procedures Procedures (including critical care time)  Medications Ordered in ED Medications  gi cocktail (Maalox,Lidocaine,Donnatal) (30 mLs Oral Given 06/09/18 2021)  ketorolac (TORADOL) injection 60 mg (60 mg Intramuscular Given 06/09/18 2022)     Initial Impression / Assessment and Plan / ED Course  I have reviewed the triage vital signs and the nursing notes.  Pertinent labs & imaging results that were available during  my care of the patient were reviewed by me and considered in my medical decision making (see chart for details).     Suspect patient may have NSAID induced gastritis.  Encouraged to decrease amount of ibuprofen she is using and take Tylenol as per bottle instructions.  She is encouraged to continue antibiotics.  Will start on Pepcid and give Zofran as needed for nausea.  Return precautions given.  Final Clinical Impressions(s) / ED Diagnoses   Final diagnoses:  Pain, dental  NSAID induced gastritis    ED Discharge Orders         Ordered    famotidine (PEPCID) 20 MG tablet  2 times daily     06/09/18 2011    ondansetron (ZOFRAN) 4 MG tablet  Every 6 hours PRN     06/09/18 2011           Loren Racer, MD 06/09/18 2030

## 2018-06-09 NOTE — Discharge Instructions (Signed)
Avoid NSAIDs including high-dose ibuprofen.  You may take Tylenol as needed for pain.  Continue to take antibiotic as prescribed.  It is important to follow-up with a dentist.

## 2018-06-09 NOTE — ED Triage Notes (Signed)
Pt reports right lower dental pain.  Was given abx and ibuprofen on 10/6 and got filled today.  States it is not helping and she cannot eat.  No obvious swelling.

## 2018-07-16 ENCOUNTER — Telehealth: Payer: Self-pay | Admitting: Advanced Practice Midwife

## 2018-07-16 ENCOUNTER — Other Ambulatory Visit: Payer: Self-pay | Admitting: Advanced Practice Midwife

## 2018-07-16 NOTE — Telephone Encounter (Signed)
Voice mail not set up. Unable to speak with pt. Last Depo was in May. Pt needs an appt. JSY

## 2018-07-16 NOTE — Telephone Encounter (Signed)
Patient called and requested a refill for her depo, stated that [facility] said the refill she had has expired.  [facility]  [phone]

## 2018-07-17 ENCOUNTER — Ambulatory Visit: Payer: Medicaid Other

## 2018-07-24 ENCOUNTER — Ambulatory Visit: Payer: Medicaid Other | Admitting: Women's Health

## 2018-07-28 ENCOUNTER — Ambulatory Visit: Payer: Medicaid Other | Admitting: Obstetrics and Gynecology

## 2018-07-30 ENCOUNTER — Ambulatory Visit: Payer: Medicaid Other | Admitting: Obstetrics and Gynecology

## 2018-07-30 ENCOUNTER — Encounter: Payer: Self-pay | Admitting: Obstetrics and Gynecology

## 2018-07-30 VITALS — BP 104/69 | HR 117 | Ht 60.0 in | Wt 111.8 lb

## 2018-07-30 DIAGNOSIS — Z308 Encounter for other contraceptive management: Secondary | ICD-10-CM | POA: Diagnosis not present

## 2018-07-30 DIAGNOSIS — Z3202 Encounter for pregnancy test, result negative: Secondary | ICD-10-CM | POA: Diagnosis not present

## 2018-07-30 LAB — POCT URINE PREGNANCY: Preg Test, Ur: NEGATIVE

## 2018-07-30 MED ORDER — MEDROXYPROGESTERONE ACETATE 150 MG/ML IM SUSP
150.0000 mg | Freq: Once | INTRAMUSCULAR | Status: AC
  Administered 2018-07-30: 150 mg via INTRAMUSCULAR

## 2018-07-30 NOTE — Progress Notes (Signed)
Patient ID: DOT SPLINTER, female   DOB: 1985/10/18, 32 y.o.   MRN: [id number]    [facility] Clinic Visit  @DATE @            Patient name: Roberta Baker MRN [id number]  Date of birth: 1985-10-04  CC & HPI:  Roberta Baker is a 32 y.o. female presenting today for Depo injection. Last shot was in May but prescription got refilled and has it with her today. Has had had period non-stop since not having depo. Has been on depo for over 12 years. Body responds to Depo very well after receiving shot. Hasn't been sexually active since last depo shot, many months ago.  ROS:  ROS  -fever -chills All systems are negative except as noted in the HPI and PMH.   Pertinent History Reviewed:   Reviewed: Medical         Past Medical History:  Diagnosis Date  . Anxiety   . Headache   . Infection    UTI  . Kidney infection   . Kidney stones   . Nausea and vomiting in pregnancy 02/07/2017  . Pain, dental 02/07/2017  . 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                              Surgical Hx:    Past Surgical History:  Procedure Laterality Date  . BREAST ENHANCEMENT SURGERY    . BREAST SURGERY     Medications: Reviewed & Updated - see associated section                       Current Outpatient Medications:  .  acetaminophen (TYLENOL) 500 MG tablet, Take 1,000 mg by mouth every 6 (six) hours as needed for mild pain., Disp: , Rfl:  .  famotidine (PEPCID) 20 MG tablet, Take 1 tablet (20 mg total) by mouth 2 (two) times daily., Disp: 30 tablet, Rfl: 0 .  ibuprofen (ADVIL,MOTRIN) 800 MG tablet, Take 1 tablet (800 mg total) by mouth 3 (three) times daily., Disp: 21 tablet, Rfl: 0 .  medroxyPROGESTERone Acetate 150 MG/ML SUSY,  INJECT 1 ML INTO THE MUSCLE EVERY 3 MONTHS AS DIRECTED, Disp: 1 mL, Rfl: 3 .  ondansetron (ZOFRAN) 4 MG tablet, Take 1 tablet (4 mg total) by mouth every 6 (six) hours as needed for nausea or vomiting., Disp: 12 tablet, Rfl: 0 .  penicillin v  potassium (VEETID) 500 MG tablet, Take 1 tablet (500 mg total) by mouth 3 (three) times daily., Disp: 30 tablet, Rfl: 0 .  promethazine (PHENERGAN) 25 MG tablet, Take 1 tablet (25 mg total) by mouth every 6 (six) hours as needed for nausea or vomiting., Disp: 30 tablet, Rfl: 0   Social History: Reviewed -  reports that she has been smoking cigarettes. She has a 0.13 pack-year smoking history. She has never used smokeless tobacco.  Objective Findings:  Vitals: not currently breastfeeding.  PHYSICAL EXAMINATION General appearance - alert, well appearing, and in no distress, oriented to person, place, and time and normal appearing weight Mental status - alert, oriented to person, place, and time, normal mood, behavior, speech, dress, motor activity, and thought processes, affect appropriate to mood  PELVIC Not done  Assessment & Plan:   A:  1.  Contraception management  P:  1.  Depo injection, in office today 2. F/u 3 month for next Depo  shot refil in place x 1 yr.    By signing my name below, I, Arnette NorrisMari Johnson, attest that this documentation has been prepared under the direction and in the presence of Tilda BurrowFerguson, Mireya Meditz V, MD. Electronically Signed: Arnette NorrisMari Johnson Medical Scribe. 07/30/18. 11:02 AM.  I personally performed the services described in this documentation, which was SCRIBED in my presence. The recorded information has been reviewed and considered accurate. It has been edited as necessary during review. Tilda BurrowJohn V Duana Benedict, MD

## 2018-07-30 NOTE — Addendum Note (Signed)
Addended by: Colen DarlingYOUNG, Janasia Coverdale S on: 07/30/2018 11:31 AM   Modules accepted: Orders

## 2018-08-20 ENCOUNTER — Encounter: Payer: Self-pay | Admitting: Obstetrics and Gynecology

## 2018-08-20 ENCOUNTER — Ambulatory Visit (INDEPENDENT_AMBULATORY_CARE_PROVIDER_SITE_OTHER): Payer: Medicaid Other | Admitting: Obstetrics and Gynecology

## 2018-08-20 VITALS — BP 106/72 | HR 101 | Temp 98.7°F | Ht 60.0 in | Wt 108.0 lb

## 2018-08-20 DIAGNOSIS — Z3202 Encounter for pregnancy test, result negative: Secondary | ICD-10-CM | POA: Diagnosis not present

## 2018-08-20 DIAGNOSIS — N946 Dysmenorrhea, unspecified: Secondary | ICD-10-CM | POA: Diagnosis not present

## 2018-08-20 LAB — POCT URINE PREGNANCY: PREG TEST UR: NEGATIVE

## 2018-08-20 MED ORDER — MEGESTROL ACETATE 40 MG PO TABS: 40.0000 mg | ORAL_TABLET | Freq: Three times a day (TID) | ORAL | 2 refills | Status: DC

## 2018-08-20 MED ORDER — KETOROLAC TROMETHAMINE 10 MG PO TABS: 10.0000 mg | ORAL_TABLET | Freq: Four times a day (QID) | ORAL | 0 refills | Status: DC | PRN

## 2018-08-20 NOTE — Progress Notes (Signed)
Patient ID: Roberta HatchetJacqueline M Stoll, female   DOB: 1986-05-27, 32 y.o.   MRN: [id number]    [facility]  @[facility] @            Patient name: Roberta Baker MRN [id number]  Date of birth: 1986-05-27  CC & HPI:  Roberta Baker is a 32 y.o. female presenting today for cramps. Having to leave work. Has had not stopped bleeding, says that normally Depo stops periods but after Depo shot bleeding stopped for 3 days then resumed with heavy flow and clots. She had gone through half of a 36 pack box of tampons. Mother is scared Roberta Baker might become anemic. She can take ibuprofen without stomach upset Yesterday had migraine and throwing up but has subsided  ROS:  ROS +dysmenorrhea +cramping -vomiting -migraine -fever -chills All systems are negative except as noted in the HPI and PMH.   Pertinent History Reviewed:   Reviewed: Medical         Past Medical History:  Diagnosis Date  . Anxiety   . Headache   . Infection    UTI  . Kidney infection   . Kidney stones   . Nausea and vomiting in pregnancy 02/07/2017  . Pain, dental 02/07/2017  . 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                              Surgical Hx:    Past Surgical History:  Procedure Laterality Date  . BREAST ENHANCEMENT SURGERY    . BREAST SURGERY     Medications: Reviewed & Updated - see associated section                       Current Outpatient Medications:  .  ibuprofen (ADVIL,MOTRIN) 200 MG tablet, Take 600 mg by mouth daily., Disp: , Rfl:  .  medroxyPROGESTERone Acetate 150 MG/ML SUSY,  INJECT 1 ML INTO THE MUSCLE EVERY 3 MONTHS AS DIRECTED, Disp: 1 mL, Rfl: 3   Social History: Reviewed -  reports that she has been smoking cigarettes. She has a 0.13 pack-year smoking history. She has never used smokeless tobacco.  Objective Findings:  Vitals: Blood pressure 106/72, pulse (!) 101, temperature 98.7 F (37.1 C), height 5' (1.524 m), weight 108 lb (49 kg), not currently  breastfeeding.  PHYSICAL EXAMINATION General appearance - Mildly ill, oriented to person, place, and time and normal appearing weight Mental status - alert, oriented to person, place, and time, normal mood, behavior, speech, dress, motor activity, and thought processes, affect appropriate to mood  PELVIC Vagina - light bleeding Uterus -normal  Assessment & Plan:   A:  1.  dysmenorrhea  P:  1.  Rx Megace TID 2. Rx Toradol for only 5 day 3. Note for work, works third shift and needs a note for authorization to go the bathroom for tampon change    By signing my name below, I, Arnette NorrisMari Johnson, attest that this documentation has been prepared under the direction and in the presence of Tilda BurrowFerguson, Nakoma Gotwalt V, MD. Electronically Signed: Arnette NorrisMari Johnson Medical Scribe. 08/20/18. 11:18 AM.  I personally performed the services described in this documentation, which was SCRIBED in my presence. The recorded information has been reviewed and considered accurate. It has been edited as necessary during review. Tilda BurrowJohn V Arshia Rondon, MD

## 2018-09-05 ENCOUNTER — Ambulatory Visit: Payer: Medicaid Other | Admitting: Obstetrics and Gynecology

## 2018-10-22 ENCOUNTER — Ambulatory Visit: Payer: Medicaid Other

## 2018-10-22 ENCOUNTER — Encounter: Payer: Self-pay | Admitting: *Deleted

## 2018-10-25 ENCOUNTER — Emergency Department (HOSPITAL_COMMUNITY): Payer: Self-pay

## 2018-10-25 ENCOUNTER — Encounter (HOSPITAL_COMMUNITY): Payer: Self-pay | Admitting: Emergency Medicine

## 2018-10-25 ENCOUNTER — Emergency Department (HOSPITAL_COMMUNITY)
Admission: EM | Admit: 2018-10-25 | Discharge: 2018-10-25 | Disposition: A | Discharge status: Home / Self Care | Payer: Self-pay | Attending: Emergency Medicine | Admitting: Emergency Medicine

## 2018-10-25 ENCOUNTER — Other Ambulatory Visit: Payer: Self-pay

## 2018-10-25 DIAGNOSIS — F17210 Nicotine dependence, cigarettes, uncomplicated: Secondary | ICD-10-CM | POA: Insufficient documentation

## 2018-10-25 DIAGNOSIS — M545 Low back pain, unspecified: Secondary | ICD-10-CM

## 2018-10-25 DIAGNOSIS — K029 Dental caries, unspecified: Secondary | ICD-10-CM | POA: Insufficient documentation

## 2018-10-25 LAB — URINALYSIS, ROUTINE W REFLEX MICROSCOPIC
BILIRUBIN URINE: NEGATIVE
Glucose, UA: NEGATIVE mg/dL
Ketones, ur: NEGATIVE mg/dL
LEUKOCYTE UA: NEGATIVE
Nitrite: NEGATIVE
PH: 6 (ref 5.0–8.0)
Protein, ur: NEGATIVE mg/dL
Specific Gravity, Urine: 1.003 — ABNORMAL LOW (ref 1.005–1.030)

## 2018-10-25 LAB — CBC WITH DIFFERENTIAL/PLATELET
Abs Immature Granulocytes: 0.02 10*3/uL (ref 0.00–0.07)
Basophils Absolute: 0 10*3/uL (ref 0.0–0.1)
Basophils Relative: 0 %
EOS ABS: 0.1 10*3/uL (ref 0.0–0.5)
Eosinophils Relative: 1 %
HEMATOCRIT: 41.4 % (ref 36.0–46.0)
Hemoglobin: 13.1 g/dL (ref 12.0–15.0)
IMMATURE GRANULOCYTES: 0 %
LYMPHS ABS: 1.7 10*3/uL (ref 0.7–4.0)
Lymphocytes Relative: 19 %
MCH: 29.9 pg (ref 26.0–34.0)
MCHC: 31.6 g/dL (ref 30.0–36.0)
MCV: 94.5 fL (ref 80.0–100.0)
MONO ABS: 0.6 10*3/uL (ref 0.1–1.0)
MONOS PCT: 6 %
Neutro Abs: 6.9 10*3/uL (ref 1.7–7.7)
Neutrophils Relative %: 74 %
PLATELETS: 288 10*3/uL (ref 150–400)
RBC: 4.38 MIL/uL (ref 3.87–5.11)
RDW: 13.2 % (ref 11.5–15.5)
WBC: 9.3 10*3/uL (ref 4.0–10.5)
nRBC: 0 % (ref 0.0–0.2)

## 2018-10-25 LAB — COMPREHENSIVE METABOLIC PANEL
ALBUMIN: 4 g/dL (ref 3.5–5.0)
ALK PHOS: 49 U/L (ref 38–126)
ALT: 11 U/L (ref 0–44)
AST: 13 U/L — ABNORMAL LOW (ref 15–41)
Anion gap: 7 (ref 5–15)
BILIRUBIN TOTAL: 0.5 mg/dL (ref 0.3–1.2)
BUN: 5 mg/dL — AB (ref 6–20)
CO2: 23 mmol/L (ref 22–32)
Calcium: 8.4 mg/dL — ABNORMAL LOW (ref 8.9–10.3)
Chloride: 110 mmol/L (ref 98–111)
Creatinine, Ser: 0.65 mg/dL (ref 0.44–1.00)
GFR calc Af Amer: >60 mL/min (ref >60)
GFR calc non Af Amer: >60 mL/min (ref >60)
GLUCOSE: 83 mg/dL (ref 70–99)
Potassium: 3.1 mmol/L — ABNORMAL LOW (ref 3.5–5.1)
Sodium: 140 mmol/L (ref 135–145)
TOTAL PROTEIN: 6.5 g/dL (ref 6.5–8.1)

## 2018-10-25 LAB — RAPID URINE DRUG SCREEN, HOSP PERFORMED
Amphetamines: NOT DETECTED
BARBITURATES: NOT DETECTED
BENZODIAZEPINES: NOT DETECTED
COCAINE: NOT DETECTED
OPIATES: NOT DETECTED
Tetrahydrocannabinol: POSITIVE — AB

## 2018-10-25 LAB — PROTIME-INR
INR: 0.98
PROTHROMBIN TIME: 12.9 s (ref 11.4–15.2)

## 2018-10-25 LAB — PREGNANCY, URINE: PREG TEST UR: NEGATIVE

## 2018-10-25 LAB — ACETAMINOPHEN LEVEL: Acetaminophen (Tylenol), Serum: <10 ug/mL — ABNORMAL LOW (ref 10–30)

## 2018-10-25 LAB — SALICYLATE LEVEL

## 2018-10-25 LAB — LIPASE, BLOOD: Lipase: 27 U/L (ref 11–51)

## 2018-10-25 MED ORDER — METHOCARBAMOL 500 MG PO TABS
1000.0000 mg | ORAL_TABLET | Freq: Once | ORAL | Status: AC
  Administered 2018-10-25: 1000 mg via ORAL
  Filled 2018-10-25: qty 2

## 2018-10-25 MED ORDER — POTASSIUM CHLORIDE CRYS ER 20 MEQ PO TBCR
40.0000 meq | EXTENDED_RELEASE_TABLET | Freq: Once | ORAL | Status: AC
  Administered 2018-10-25: 40 meq via ORAL
  Filled 2018-10-25: qty 2

## 2018-10-25 MED ORDER — OXYCODONE HCL 5 MG PO TABS: 5.0000 mg | ORAL_TABLET | Freq: Four times a day (QID) | ORAL | 0 refills | Status: DC | PRN

## 2018-10-25 MED ORDER — METHOCARBAMOL 500 MG PO TABS: 1000.0000 mg | ORAL_TABLET | Freq: Four times a day (QID) | ORAL | 0 refills | Status: DC | PRN

## 2018-10-25 MED ORDER — PENICILLIN V POTASSIUM 250 MG PO TABS: 250.0000 mg | ORAL_TABLET | Freq: Four times a day (QID) | ORAL | 0 refills | Status: DC

## 2018-10-25 NOTE — ED Provider Notes (Signed)
[facility] EMERGENCY DEPARTMENT Provider Note   CSN: [id number] Arrival date & time: 10/25/18  0175    History   Chief Complaint Chief Complaint  Patient presents with  . Back Pain    HPI Roberta Baker is a 33 y.o. female.     HPI  Pt was seen at 0950.  Per pt, c/o gradual onset and persistence of constant low back "pain" for the past several weeks. Pain worsens with palpation of the area and body position changes. Pt states she does a lot of "standing and bending and lifting at work." Pt states when she is home, her "back doesn't hurt."  Pt states she "takes four 500mg  tylenol's over the course of 8 hours" when she is at work and "maybe another 2 at home." States she has been taking rx motrin. States the medications are not helping her pain. Denies incont/retention of bowel or bladder, no saddle anesthesia, no focal motor weakness, no tingling/numbness in extremities, no fevers, no injury, no abd pain.  Pt also c/o gradual onset and persistence of constant acute flair of her chronic left and right lower teeth "pain" for the past several weeks.  Denies fevers, no intra-oral edema, no rash, no facial swelling, no dysphagia, no neck pain, no CP/SOB.       Past Medical History:  Diagnosis Date  . Anxiety   . Headache   . Infection    UTI  . Kidney infection   . Kidney stones   . Nausea and vomiting in pregnancy 02/07/2017  . Pain, dental 02/07/2017  . 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 History of prior pregnancy with IUGR newborn 03/14/2017  . 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 Pain, dental 02/07/2017  . 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 Depression with anxiety 12/12/2016    Past Surgical History:  Procedure Laterality Date  . BREAST ENHANCEMENT SURGERY    . BREAST SURGERY       OB History    Gravida  1   Para  1   Term  1   Preterm      AB  0   Living  1     SAB      TAB      Ectopic        Multiple  0   Live Births               Home Medications    Prior to Admission medications   Medication Sig Start Date End Date Taking? Authorizing Provider  ibuprofen (ADVIL,MOTRIN) 200 MG tablet Take 600 mg by mouth daily.    [provider]  ketorolac (TORADOL) 10 MG tablet Take 1 tablet (10 mg total) by mouth every 6 (six) hours as needed (five day limit postop). 08/20/18   Tilda Burrow, MD  medroxyPROGESTERone Acetate 150 MG/ML SUSY  INJECT 1 ML INTO THE MUSCLE EVERY 3 MONTHS AS DIRECTED 07/16/18   Cresenzo-Dishmon, Scarlette Calico, CNM  megestrol (MEGACE) 40 MG tablet Take 1 tablet (40 mg total) by mouth 3 (three) times daily. Until bleeding stops, then qd x 1 wk. 08/20/18   Tilda Burrow, MD    Family History Family History  Problem Relation Age of Onset  . Mental illness Mother   . Cancer Maternal Uncle   . Cancer Maternal Grandmother        breast cancer  . Heart attack Maternal  Grandfather     Social History Social History   Tobacco Use  . Smoking status: Current Every Day Smoker    Packs/day: 0.25    Years: 0.50    Pack years: 0.12    Types: Cigarettes    Last attempt to quit: 01/14/2017    Years since quitting: 1.7  . Smokeless tobacco: Never Used  Substance Use Topics  . Alcohol use: No  . Drug use: No     Allergies   Sulfa antibiotics   Review of Systems Review of Systems ROS: Statement: All systems negative except as marked or noted in the HPI; Constitutional: Negative for fever and chills. ; ; Eyes: Negative for eye pain and discharge. ; ; ENMT: Positive for dental caries, dental hygiene poor and toothache. Negative for ear pain, bleeding gums, dental injury, facial deformity, facial swelling, hoarseness, nasal congestion, sinus pressure, sore throat, throat swelling and tongue swollen. ; ; ; Cardiovascular: Negative for chest pain, palpitations, diaphoresis, dyspnea and peripheral edema. ; ; Respiratory: Negative for cough, wheezing and  stridor. ; ; Gastrointestinal: Negative for nausea, vomiting, diarrhea, abdominal pain, blood in stool, hematemesis, jaundice and rectal bleeding. . ; ; Genitourinary: Negative for dysuria, flank pain and hematuria. ; ; Musculoskeletal: +LBP. Negative for neck pain. Negative for swelling and trauma.; ; Skin: Negative for pruritus, rash, abrasions, blisters, bruising and skin lesion.; ; Neuro: Negative for headache, lightheadedness and neck stiffness. Negative for weakness, altered level of consciousness, altered mental status, extremity weakness, paresthesias, involuntary movement, seizure and syncope.       Physical Exam Updated Vital Signs BP 121/80 (BP Location: Left Arm)   Pulse (!) 108   Temp 98.1 F (36.7 C) (Oral)   Resp 12   Ht 5' (1.524 m)   Wt 45.4 kg   LMP 10/25/2018   SpO2 99%   BMI 19.53 kg/m   Physical Exam 0955: Physical examination: Vital signs and O2 SAT: Reviewed; Constitutional: Well developed, Well nourished, Well hydrated, In no acute distress; Head and Face: Normocephalic, Atraumatic; Eyes: EOMI, PERRL, No scleral icterus; ENMT: Mouth and pharynx normal, Poor dentition, Widespread dental decay, Left TM normal, Right TM normal, Mucous membranes moist, +lower right 1st premolar and left lower canine with extensive dental decay.  No gingival erythema, edema, fluctuance, or drainage.  No intra-oral edema. No submandibular or sublingual edema. No hoarse voice, no drooling, no stridor. No trismus. ;  Neck: Supple, Full range of motion, No lymphadenopathy; Cardiovascular: Regular rate and rhythm, No gallop; Respiratory: Breath sounds clear & equal bilaterally, No wheezes.  Speaking full sentences with ease, Normal respiratory effort/excursion; Chest: Nontender, Movement normal; Abdomen: Soft, Nontender, Nondistended, Normal bowel sounds; Genitourinary: No CVA tenderness; Spine:  No midline CS, TS, LS tenderness. +TTP bilat lumbar paraspinal muscles.;; Extremities: Peripheral  pulses normal, No tenderness, No edema, No calf edema or asymmetry.; Neuro: AA&Ox3, Major CN grossly intact.  Speech clear. No gross focal motor or sensory deficits in extremities. Climbs on and off stretcher easily by herself. Gait steady.; Skin: Color normal, Warm, Dry.     ED Treatments / Results  Labs (all labs ordered are listed, but only abnormal results are displayed)   EKG None  Radiology   Procedures Procedures (including critical care time)  Medications Ordered in ED Medications - No data to display   Initial Impression / Assessment and Plan / ED Course  I have reviewed the triage vital signs and the nursing notes.  Pertinent labs & imaging results that were  available during my care of the patient were reviewed by me and considered in my medical decision making (see chart for details).    MDM Reviewed: previous chart, nursing note and vitals Reviewed previous: labs Interpretation: labs and x-ray     Results for orders placed or performed during the hospital encounter of 10/25/18  Pregnancy, urine  Result Value Ref Range   Preg Test, Ur NEGATIVE NEGATIVE  Urinalysis, Routine w reflex microscopic  Result Value Ref Range   Color, Urine STRAW (A) YELLOW   APPearance CLEAR CLEAR   Specific Gravity, Urine 1.003 (L) 1.005 - 1.030   pH 6.0 5.0 - 8.0   Glucose, UA NEGATIVE NEGATIVE mg/dL   Hgb urine dipstick SMALL (A) NEGATIVE   Bilirubin Urine NEGATIVE NEGATIVE   Ketones, ur NEGATIVE NEGATIVE mg/dL   Protein, ur NEGATIVE NEGATIVE mg/dL   Nitrite NEGATIVE NEGATIVE   Leukocytes,Ua NEGATIVE NEGATIVE   RBC / HPF 0-5 0 - 5 RBC/hpf   WBC, UA 0-5 0 - 5 WBC/hpf   Bacteria, UA RARE (A) NONE SEEN   Squamous Epithelial / LPF 0-5 0 - 5  Comprehensive metabolic panel  Result Value Ref Range   Sodium 140 135 - 145 mmol/L   Potassium 3.1 (L) 3.5 - 5.1 mmol/L   Chloride 110 98 - 111 mmol/L   CO2 23 22 - 32 mmol/L   Glucose, Bld 83 70 - 99 mg/dL   BUN 5 (L) 6 - 20  mg/dL   Creatinine, Ser 3.71 0.44 - 1.00 mg/dL   Calcium 8.4 (L) 8.9 - 10.3 mg/dL   Total Protein 6.5 6.5 - 8.1 g/dL   Albumin 4.0 3.5 - 5.0 g/dL   AST 13 (L) 15 - 41 U/L   ALT 11 0 - 44 U/L   Alkaline Phosphatase 49 38 - 126 U/L   Total Bilirubin 0.5 0.3 - 1.2 mg/dL   GFR calc non Af Amer >60 >60 mL/min   GFR calc Af Amer >60 >60 mL/min   Anion gap 7 5 - 15  CBC with Differential  Result Value Ref Range   WBC 9.3 4.0 - 10.5 K/uL   RBC 4.38 3.87 - 5.11 MIL/uL   Hemoglobin 13.1 12.0 - 15.0 g/dL   HCT 06.2 69.4 - 85.4 %   MCV 94.5 80.0 - 100.0 fL   MCH 29.9 26.0 - 34.0 pg   MCHC 31.6 30.0 - 36.0 g/dL   RDW 62.7 03.5 - 00.9 %   Platelets 288 150 - 400 K/uL   nRBC 0.0 0.0 - 0.2 %   Neutrophils Relative % 74 %   Neutro Abs 6.9 1.7 - 7.7 K/uL   Lymphocytes Relative 19 %   Lymphs Abs 1.7 0.7 - 4.0 K/uL   Monocytes Relative 6 %   Monocytes Absolute 0.6 0.1 - 1.0 K/uL   Eosinophils Relative 1 %   Eosinophils Absolute 0.1 0.0 - 0.5 K/uL   Basophils Relative 0 %   Basophils Absolute 0.0 0.0 - 0.1 K/uL   Immature Granulocytes 0 %   Abs Immature Granulocytes 0.02 0.00 - 0.07 K/uL  Lipase, blood  Result Value Ref Range   Lipase 27 11 - 51 U/L  Acetaminophen level  Result Value Ref Range   Acetaminophen (Tylenol), Serum <10 (L) 10 - 30 ug/mL  Salicylate level  Result Value Ref Range   Salicylate Lvl <7.0 2.8 - 30.0 mg/dL  Protime-INR  Result Value Ref Range   Prothrombin Time 12.9 11.4 - 15.2 seconds  INR 0.98   Urine rapid drug screen (hosp performed)  Result Value Ref Range   Opiates NONE DETECTED NONE DETECTED   Cocaine NONE DETECTED NONE DETECTED   Benzodiazepines NONE DETECTED NONE DETECTED   Amphetamines NONE DETECTED NONE DETECTED   Tetrahydrocannabinol POSITIVE (A) NONE DETECTED   Barbiturates NONE DETECTED NONE DETECTED   Dg Lumbar Spine Complete Result Date: 10/25/2018 CLINICAL DATA:  Lower back pain for 2 weeks. EXAM: LUMBAR SPINE - COMPLETE 4+ VIEW COMPARISON:   None. FINDINGS: There is no evidence of lumbar spine fracture. Alignment is normal. Intervertebral disc spaces are maintained. IMPRESSION: Negative. Electronically Signed   By: Ted Mcalpineobrinka  Dimitrova M.D.   On: 10/25/2018 10:56    1130:  Initial concern regarding amount of APAP pt initially told RN at Triage, but clarified as above. Workup reassuring. Tx symptomatically at this time. Pt encouraged to f/u with dentist or oral surgeon for her dental needs for good continuity of care and definitive treatment.  Pt verb understanding. Dx and testing d/w pt.  Questions answered.  Verb understanding, agreeable to d/c home with outpt f/u.      Final Clinical Impressions(s) / ED Diagnoses   Final diagnoses:  None    ED Discharge Orders    None       Samuel JesterMcManus, Sharea Guinther, DO 10/29/18 [id number]

## 2018-10-25 NOTE — ED Notes (Signed)
Left lower back pain that is sharp and stabbing. Lower bilateral dental pain from broken teeth.

## 2018-10-25 NOTE — Discharge Instructions (Signed)
Take the prescriptions as directed.  Apply moist heat or ice to the area(s) of discomfort, for 15 minutes at a time, several times per day for the next few days.  Do not fall asleep on a heating or ice pack.  Call your regular medical doctor and your Dentist on Monday to schedule a follow up appointment this week.  Return to the Emergency Department immediately if worsening.

## 2018-10-25 NOTE — ED Triage Notes (Signed)
Patient c/o lower back pain x2 weeks with no known injury. Per patient no complications with BMs or urination. Per patient with standing great toes start to become numb bilaterally. Patient also c/o possible dental infection with pain to lower jaw. Per patient taking large amounts of ibuprofen with no relief. Patient reports vomiting.

## 2018-10-30 ENCOUNTER — Encounter (HOSPITAL_COMMUNITY): Payer: Self-pay | Admitting: Emergency Medicine

## 2018-10-30 ENCOUNTER — Other Ambulatory Visit: Payer: Self-pay

## 2018-10-30 ENCOUNTER — Emergency Department (HOSPITAL_COMMUNITY)
Admission: EM | Admit: 2018-10-30 | Discharge: 2018-10-30 | Disposition: A | Discharge status: Home / Self Care | Payer: Medicaid Other | Attending: Emergency Medicine | Admitting: Emergency Medicine

## 2018-10-30 DIAGNOSIS — F17210 Nicotine dependence, cigarettes, uncomplicated: Secondary | ICD-10-CM | POA: Insufficient documentation

## 2018-10-30 DIAGNOSIS — Z79899 Other long term (current) drug therapy: Secondary | ICD-10-CM | POA: Insufficient documentation

## 2018-10-30 DIAGNOSIS — T887XXA Unspecified adverse effect of drug or medicament, initial encounter: Secondary | ICD-10-CM

## 2018-10-30 DIAGNOSIS — T788XXA Other adverse effects, not elsewhere classified, initial encounter: Secondary | ICD-10-CM | POA: Insufficient documentation

## 2018-10-30 MED ORDER — ONDANSETRON 8 MG PO TBDP
8.0000 mg | ORAL_TABLET | Freq: Once | ORAL | Status: AC
  Administered 2018-10-30: 8 mg via ORAL
  Filled 2018-10-30: qty 1

## 2018-10-30 MED ORDER — ONDANSETRON HCL 4 MG PO TABS: 4.0000 mg | ORAL_TABLET | Freq: Four times a day (QID) | ORAL | 0 refills | Status: DC

## 2018-10-30 NOTE — ED Provider Notes (Signed)
[name] EMERGENCY DEPARTMENT Provider Note   CSN: [id number] Arrival date & time: 10/30/18  4098    History   Chief Complaint Chief Complaint  Patient presents with  . Emesis    HPI Roberta Baker is a 33 y.o. female.     HPI   Roberta Baker is a 33 y.o. female who presents to the Emergency Department complaining of persistent nausea and intermittent vomiting for 5 days since starting Penicillin.  She was seen here on 10/25/18 for back pain and a dental infection.  She reports the vomiting began approximately 10 minutes after taking the first dose of antibiotic and has been intermittent since then.  She also complains of diffuse abdominal pain that she describes as "cramps" just before she vomits.  Abdominal cramping is intermittent and began only after several episodes of vomiting.  She reports that she continues to eat small amounts of food and drinks fluids without difficulty.  She is requesting a prescription for something for her nausea and vomiting.  She denies fever, chills, increasing dental or back pain, and increased facial swelling.      Past Medical History:  Diagnosis Date  . Anxiety   . Headache   . Infection    UTI  . Kidney infection   . Kidney stones   . Nausea and vomiting in pregnancy 02/07/2017  . Pain, dental 02/07/2017  . 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 History of prior pregnancy with IUGR newborn 03/14/2017  . 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 Pain, dental 02/07/2017  . 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 Depression with anxiety 12/12/2016    Past Surgical History:  Procedure Laterality Date  . BREAST ENHANCEMENT SURGERY    . BREAST SURGERY       OB History    Gravida  1   Para  1   Term  1   Preterm      AB  0   Living  1     SAB      TAB      Ectopic      Multiple  0   Live Births               Home Medications    Prior to Admission  medications   Medication Sig Start Date End Date Taking? Authorizing Provider  ibuprofen (ADVIL,MOTRIN) 200 MG tablet Take 600 mg by mouth daily.    [provider]  ketorolac (TORADOL) 10 MG tablet Take 1 tablet (10 mg total) by mouth every 6 (six) hours as needed (five day limit postop). 08/20/18   Tilda Burrow, MD  medroxyPROGESTERone Acetate 150 MG/ML SUSY  INJECT 1 ML INTO THE MUSCLE EVERY 3 MONTHS AS DIRECTED 07/16/18   Cresenzo-Dishmon, Scarlette Calico, CNM  megestrol (MEGACE) 40 MG tablet Take 1 tablet (40 mg total) by mouth 3 (three) times daily. Until bleeding stops, then qd x 1 wk. 08/20/18   Tilda Burrow, MD  methocarbamol (ROBAXIN) 500 MG tablet Take 2 tablets (1,000 mg total) by mouth 4 (four) times daily as needed for muscle spasms (muscle spasm/pain). 10/25/18   Samuel Jester, DO  oxyCODONE (ROXICODONE) 5 MG immediate release tablet Take 1 tablet (5 mg total) by mouth every 6 (six) hours as needed for moderate pain or severe pain. 10/25/18   Samuel Jester, DO  penicillin v potassium (VEETID) 250 MG tablet Take 1  tablet (250 mg total) by mouth 4 (four) times daily. 10/25/18   Samuel Jester, DO    Family History Family History  Problem Relation Age of Onset  . Mental illness Mother   . Cancer Maternal Uncle   . Cancer Maternal Grandmother        breast cancer  . Heart attack Maternal Grandfather     Social History Social History   Tobacco Use  . Smoking status: Current Every Day Smoker    Packs/day: 0.25    Years: 0.50    Pack years: 0.12    Types: Cigarettes    Last attempt to quit: 01/14/2017    Years since quitting: 1.7  . Smokeless tobacco: Never Used  Substance Use Topics  . Alcohol use: No  . Drug use: No     Allergies   Sulfa antibiotics   Review of Systems Review of Systems  Constitutional: Negative for appetite change, chills and fever.  HENT: Positive for dental problem. Negative for sore throat and trouble swallowing.     Respiratory: Negative for chest tightness and shortness of breath.   Cardiovascular: Negative for chest pain.  Gastrointestinal: Positive for abdominal pain, nausea and vomiting. Negative for blood in stool and diarrhea.  Genitourinary: Negative for decreased urine volume, difficulty urinating and dysuria.  Musculoskeletal: Negative for back pain.  Skin: Negative for color change and rash.  Neurological: Negative for dizziness, weakness, numbness and headaches.  Hematological: Negative for adenopathy.     Physical Exam Updated Vital Signs BP 104/66   Pulse 84   Temp 98.5 F (36.9 C) (Oral)   Resp 18   Ht 5' (1.524 m)   Wt 45.4 kg   LMP 10/25/2018   SpO2 98%   BMI 19.53 kg/m   Physical Exam Vitals signs and nursing note reviewed.  Constitutional:      General: She is not in acute distress.    Appearance: She is well-developed. She is not ill-appearing.  HENT:     Head: Atraumatic.     Mouth/Throat:     Mouth: Mucous membranes are moist.     Pharynx: Oropharynx is clear. Uvula midline. No posterior oropharyngeal erythema or uvula swelling.     Comments: Dental caries and tenderness of the left lower lateral incisor.  No facial edema or erythema.  No fluctuance of the surrounding gingiva.  No trismus Neck:     Musculoskeletal: Normal range of motion. No muscular tenderness.  Cardiovascular:     Rate and Rhythm: Normal rate and regular rhythm.  Pulmonary:     Effort: Pulmonary effort is normal. No respiratory distress.     Breath sounds: Normal breath sounds.  Abdominal:     General: Bowel sounds are normal. There is no distension.     Palpations: Abdomen is soft. There is no mass.     Tenderness: There is abdominal tenderness. There is no guarding or rebound.     Comments: Diffuse, mild ttp of the entire abdomen.  No guarding or rebound tenderness.    Musculoskeletal: Normal range of motion.  Lymphadenopathy:     Cervical: No cervical adenopathy.  Skin:    General:  Skin is warm.     Findings: No rash.  Neurological:     Mental Status: She is alert and oriented to person, place, and time.     Motor: No abnormal muscle tone.     Coordination: Coordination normal.      ED Treatments / Results  Labs (all labs ordered  are listed, but only abnormal results are displayed) Labs Reviewed - No data to display  EKG None  Radiology No results found.  Procedures Procedures (including critical care time)  Medications Ordered in ED Medications  ondansetron (ZOFRAN-ODT) disintegrating tablet 8 mg (has no administration in time range)     Initial Impression / Assessment and Plan / ED Course  I have reviewed the triage vital signs and the nursing notes.  Pertinent labs & imaging results that were available during my care of the patient were reviewed by me and considered in my medical decision making (see chart for details).       Pt with NV after starting PCN.  Diffuse abd tenderness with onset after vomiting.  She is well appearing.  Ambulates with steady gait.  Non toxic appearing.  Mucous membranes are moist.  Will try oral fluid challenge.  I have offered IVF's and to check baseline labs, but pt has her child with her and she prefers to try anti-emetic at home and agrees to return here if her abdominal pain continues.   She is well appearing, mucous membranes are moist and vitals reassuring.  Doubt surgical abdomen. I have discussed symptoms to prompt ER return and pt verbalized understanding and agrees  Final Clinical Impressions(s) / ED Diagnoses   Final diagnoses:  Non-dose-related adverse reaction to medication, initial encounter    ED Discharge Orders    None       Pauline Aus, PA-C 10/31/18 1246    Blane Ohara, MD 11/02/18 [phone]

## 2018-10-30 NOTE — ED Triage Notes (Signed)
Non stop N/V since starting abx given here for dental infection

## 2018-10-30 NOTE — Discharge Instructions (Addendum)
Drink plenty of fluids.  Take the nausea medication about 20-30 minutes before taking your antibiotic.  Return to ER if your abdominal pain worsens or is not improving or if you develop a fever.

## 2020-08-16 ENCOUNTER — Other Ambulatory Visit: Payer: Medicaid Other

## 2020-09-03 NOTE — L&D Delivery Note (Signed)
OB/[facility]N Faculty Practice Delivery Note  Roberta Baker is a 35 y.o. [id number] s/p SVD at [redacted]w[redacted]d. She was admitted for SOL.   ROM: 0h 29m with clear fluid GBS Status: Negative   Delivery Date/Time: 08/26/21 at 1321  Delivery: Called to room and patient was complete and pushing. Head delivered ROA. No nuchal cord present. Shoulder and body delivered in usual fashion. Infant with spontaneous cry, placed on mother's abdomen, dried and stimulated. Cord clamped x 2 after 1-minute delay and cut. Cord blood drawn. Placenta delivered spontaneously with gentle cord traction. Trailing membranes noted. Lower uterine sweep performed with removal of clot and residual membranes. Fundus firm with massage and Pitocin. Labia, perineum, vagina, and cervix were inspected, and a right labial laceration was noted that was hemostatic and not repaired.   Placenta: Intact, 3VC - sent to L&D Complications: None Lacerations: Right labial EBL: 300 cc Analgesia: None  Infant: Viable female   APGARs 7 and 9   2700 g  Evalina Field, MD OB/GYN Fellow, Faculty Practice

## 2020-10-10 ENCOUNTER — Other Ambulatory Visit: Payer: Self-pay

## 2020-10-10 ENCOUNTER — Emergency Department (HOSPITAL_COMMUNITY)
Admission: EM | Admit: 2020-10-10 | Discharge: 2020-10-10 | Disposition: A | Discharge status: Home / Self Care | Payer: Medicaid Other | Attending: Emergency Medicine | Admitting: Emergency Medicine

## 2020-10-10 ENCOUNTER — Emergency Department (HOSPITAL_COMMUNITY): Payer: Medicaid Other

## 2020-10-10 ENCOUNTER — Encounter (HOSPITAL_COMMUNITY): Payer: Self-pay

## 2020-10-10 DIAGNOSIS — R600 Localized edema: Secondary | ICD-10-CM | POA: Diagnosis present

## 2020-10-10 DIAGNOSIS — F17210 Nicotine dependence, cigarettes, uncomplicated: Secondary | ICD-10-CM | POA: Insufficient documentation

## 2020-10-10 DIAGNOSIS — K047 Periapical abscess without sinus: Secondary | ICD-10-CM | POA: Insufficient documentation

## 2020-10-10 DIAGNOSIS — R519 Headache, unspecified: Secondary | ICD-10-CM | POA: Insufficient documentation

## 2020-10-10 DIAGNOSIS — R220 Localized swelling, mass and lump, head: Secondary | ICD-10-CM

## 2020-10-10 LAB — I-STAT CHEM 8, ED
BUN: <3 mg/dL — ABNORMAL LOW (ref 6–20)
Calcium, Ion: 1.14 mmol/L — ABNORMAL LOW (ref 1.15–1.40)
Chloride: 105 mmol/L (ref 98–111)
Creatinine, Ser: 0.4 mg/dL — ABNORMAL LOW (ref 0.44–1.00)
Glucose, Bld: 92 mg/dL (ref 70–99)
HCT: 36 % (ref 36.0–46.0)
Hemoglobin: 12.2 g/dL (ref 12.0–15.0)
Potassium: 3.1 mmol/L — ABNORMAL LOW (ref 3.5–5.1)
Sodium: 142 mmol/L (ref 135–145)
TCO2: 26 mmol/L (ref 22–32)

## 2020-10-10 MED ORDER — IOHEXOL 300 MG/ML  SOLN
75.0000 mL | Freq: Once | INTRAMUSCULAR | Status: AC | PRN
  Administered 2020-10-10: 75 mL via INTRAVENOUS

## 2020-10-10 MED ORDER — CLINDAMYCIN PHOSPHATE 600 MG/50ML IV SOLN
600.0000 mg | Freq: Once | INTRAVENOUS | Status: AC
  Administered 2020-10-10: 600 mg via INTRAVENOUS
  Filled 2020-10-10: qty 50

## 2020-10-10 MED ORDER — ONDANSETRON HCL 4 MG/2ML IJ SOLN
2.0000 mg | Freq: Once | INTRAMUSCULAR | Status: AC
  Administered 2020-10-10: 2 mg via INTRAVENOUS
  Filled 2020-10-10: qty 2

## 2020-10-10 MED ORDER — MORPHINE SULFATE (PF) 2 MG/ML IV SOLN
2.0000 mg | Freq: Once | INTRAVENOUS | Status: AC
  Administered 2020-10-10: 2 mg via INTRAVENOUS
  Filled 2020-10-10: qty 1

## 2020-10-10 MED ORDER — ONDANSETRON 4 MG PO TBDP: 4.0000 mg | ORAL_TABLET | Freq: Three times a day (TID) | ORAL | 0 refills | Status: DC | PRN

## 2020-10-10 MED ORDER — CLINDAMYCIN HCL 150 MG PO CAPS: 300.0000 mg | ORAL_CAPSULE | Freq: Four times a day (QID) | ORAL | 0 refills | Status: AC

## 2020-10-10 MED ORDER — SODIUM CHLORIDE 0.9 % IV BOLUS
500.0000 mL | Freq: Once | INTRAVENOUS | Status: AC
  Administered 2020-10-10: 500 mL via INTRAVENOUS

## 2020-10-10 NOTE — Discharge Instructions (Addendum)
At this time there does not appear to be the presence of an emergent medical condition, however there is always the potential for conditions to change. Please read and follow the below instructions.  Please return to the Emergency Department immediately for any new or worsening symptoms or if your symptoms do not improve within 2 days. Please be sure to follow up with your Primary Care Provider within one week regarding your visit today; please call their office to schedule an appointment even if you are feeling better for a follow-up visit. Please take your antibiotic Clindamycin as prescribed until complete to help with your symptoms.  Please drink enough water to avoid dehydration and get plenty of rest. Please call your dentist first thing tomorrow morning to schedule follow-up appointment for reevaluation and treatment of your dental infection.  If you are unable to reach your dentist Dr. Aundria Rud please call the on-call dentist Dr. Mia Creek first thing tomorrow morning to schedule a follow-up appointment  Go to the nearest Emergency Department immediately if: You have fever or chills Your symptoms suddenly get worse. You have a very bad headache. You have problems breathing or swallowing. You have trouble opening your mouth. You have swelling below your jaw. You have swelling in your neck or close to your eye. You have any new/concerning or worsening of symptoms   Please read the additional information packets attached to your discharge summary.  Do not take your medicine if  develop an itchy rash, swelling in your mouth or lips, or difficulty breathing; call 911 and seek immediate emergency medical attention if this occurs.  You may review your lab tests and imaging results in their entirety on your MyChart account.  Please discuss all results of fully with your primary care provider and other specialist at your follow-up visit.  Note: Portions of this text may have been transcribed  using voice recognition software. Every effort was made to ensure accuracy; however, inadvertent computerized transcription errors may still be present.

## 2020-10-10 NOTE — ED Triage Notes (Signed)
Pt to er, pt states that two days ago she started having some pain and swelling in her L face, pt has swelling to her L lower jaw.  Pt states that she has a broken tooth in the area.

## 2020-10-10 NOTE — ED Provider Notes (Signed)
[name] EMERGENCY DEPARTMENT Provider Note   CSN: [id number] Arrival date & time: 10/10/20  1635     History Chief Complaint  Patient presents with  . Facial Swelling    Roberta Baker is a 35 y.o. female presents today for dental pain and left lower facial swelling onset 2 days ago describes pain as constant moderate-severe aching nonradiating worsened with palpation and chewing no alleviating factors.  Denies fever, chills, drooling, voice change, neck stiffness, difficulty swallowing, abdominal pain, nausea/vomiting or any additional concerns  HPI     Past Medical History:  Diagnosis Date  . Anxiety   . Headache   . Infection    UTI  . Kidney infection   . Kidney stones   . Nausea and vomiting in pregnancy 02/07/2017  . Pain, dental 02/07/2017  . 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 History of prior pregnancy with IUGR newborn 03/14/2017  . 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 Pain, dental 02/07/2017  . 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 Depression with anxiety 12/12/2016    Past Surgical History:  Procedure Laterality Date  . BREAST ENHANCEMENT SURGERY    . BREAST SURGERY       OB History    Gravida  1   Para  1   Term  1   Preterm      AB  0   Living  1     SAB      IAB      Ectopic      Multiple  0   Live Births              Family History  Problem Relation Age of Onset  . Mental illness Mother   . Cancer Maternal Uncle   . Cancer Maternal Grandmother        breast cancer  . Heart attack Maternal Grandfather     Social History   Tobacco Use  . Smoking status: Current Every Day Smoker    Packs/day: 0.25    Years: 0.50    Pack years: 0.12    Types: Cigarettes  . Smokeless tobacco: Never Used  Vaping Use  . Vaping Use: Never used  Substance Use Topics  . Alcohol use: No  . Drug use: No    Home Medications Prior to Admission medications   Medication Sig  Start Date End Date Taking? Authorizing Provider  clindamycin (CLEOCIN) 150 MG capsule Take 2 capsules (300 mg total) by mouth 4 (four) times daily for 7 days. 10/10/20 10/17/20 Yes Harlene Salts A, PA-C  ondansetron (ZOFRAN ODT) 4 MG disintegrating tablet Take 1 tablet (4 mg total) by mouth every 8 (eight) hours as needed for nausea or vomiting. 10/10/20  Yes Harlene Salts A, PA-C  ibuprofen (ADVIL,MOTRIN) 200 MG tablet Take 600 mg by mouth daily.    [provider]  ketorolac (TORADOL) 10 MG tablet Take 1 tablet (10 mg total) by mouth every 6 (six) hours as needed (five day limit postop). 08/20/18   Tilda Burrow, MD  medroxyPROGESTERone Acetate 150 MG/ML SUSY  INJECT 1 ML INTO THE MUSCLE EVERY 3 MONTHS AS DIRECTED 07/16/18   Cresenzo-Dishmon, Scarlette Calico, CNM  megestrol (MEGACE) 40 MG tablet Take 1 tablet (40 mg total) by mouth 3 (three) times daily. Until bleeding stops, then qd x 1 wk. 08/20/18   Tilda Burrow, MD  methocarbamol (ROBAXIN) 500 MG tablet  Take 2 tablets (1,000 mg total) by mouth 4 (four) times daily as needed for muscle spasms (muscle spasm/pain). 10/25/18   Samuel Jester, DO  ondansetron (ZOFRAN) 4 MG tablet Take 1 tablet (4 mg total) by mouth every 6 (six) hours. 10/30/18   Triplett, Tammy, PA-C  oxyCODONE (ROXICODONE) 5 MG immediate release tablet Take 1 tablet (5 mg total) by mouth every 6 (six) hours as needed for moderate pain or severe pain. 10/25/18   Samuel Jester, DO  penicillin v potassium (VEETID) 250 MG tablet Take 1 tablet (250 mg total) by mouth 4 (four) times daily. 10/25/18   Samuel Jester, DO    Allergies    Sulfa antibiotics  Review of Systems   Review of Systems Ten systems are reviewed and are negative for acute change except as noted in the HPI  Physical Exam Updated Vital Signs BP 119/87   Pulse (!) 102   Temp 98.7 F (37.1 C) (Oral)   Resp 18   Ht 5' (1.524 m)   Wt 45.3 kg   SpO2 99%   BMI 19.49 kg/m   Physical  Exam Constitutional:      General: She is not in acute distress.    Appearance: Normal appearance. She is well-developed. She is not ill-appearing or diaphoretic.  HENT:     Head: Normocephalic and atraumatic.     Jaw: There is normal jaw occlusion. No trismus.      Comments: Left anterior mandibular swelling    Right Ear: External ear normal.     Left Ear: External ear normal.     Nose: Nose normal.     Mouth/Throat:      Comments: Poor dentition overall multiple dental caries present.  Tooth #21 tender to percussion, large cavity present, swelling to the gumline extending below the left jaw.    The patient has normal phonation and is in control of secretions. No stridor.  Midline uvula without edema. Soft palate rises symmetrically. No tonsillar erythema, swelling or exudates. Tongue protrusion is normal, floor of mouth is soft. No trismus. No creptius on neck palpatio. No gingival erythema or fluctuance noted. Mucus membranes moist. No pallor noted. Eyes:     General: Vision grossly intact. Gaze aligned appropriately.     Extraocular Movements: Extraocular movements intact.     Conjunctiva/sclera: Conjunctivae normal.     Pupils: Pupils are equal, round, and reactive to light.  Neck:     Trachea: Trachea and phonation normal. No tracheal tenderness or tracheal deviation.   Pulmonary:     Effort: Pulmonary effort is normal. No respiratory distress.  Abdominal:     General: There is no distension.     Palpations: Abdomen is soft.     Tenderness: There is no abdominal tenderness. There is no guarding or rebound.  Musculoskeletal:        General: Normal range of motion.     Cervical back: Normal range of motion and neck supple. No edema, erythema or rigidity.  Skin:    General: Skin is warm and dry.  Neurological:     Mental Status: She is alert.     GCS: GCS eye subscore is 4. GCS verbal subscore is 5. GCS motor subscore is 6.     Comments: Speech is clear and goal oriented,  follows commands Major Cranial nerves without deficit, no facial droop Moves extremities without ataxia, coordination intact  Psychiatric:        Behavior: Behavior normal.     ED Results /  Procedures / Treatments   Labs (all labs ordered are listed, but only abnormal results are displayed) Labs Reviewed  I-STAT CHEM 8, ED - Abnormal; Notable for the following components:      Result Value   Potassium 3.1 (*)    BUN <3 (*)    Creatinine, Ser 0.40 (*)    Calcium, Ion 1.14 (*)    All other components within normal limits  I-STAT BETA HCG BLOOD, ED ([facility], [facility], [facility] ONLY)    EKG None  Radiology CT Soft Tissue Neck W Contrast  Result Date: 10/10/2020 CLINICAL DATA:  Initial evaluation for acute pain and swelling to left face. EXAM: CT NECK WITH CONTRAST TECHNIQUE: Multidetector CT imaging of the neck was performed using the standard protocol following the bolus administration of intravenous contrast. CONTRAST:  74mL OMNIPAQUE IOHEXOL 300 MG/ML  SOLN COMPARISON:  None. FINDINGS: Pharynx and larynx: There is asymmetric soft tissue swelling with inflammatory stranding adjacent to the parasymphyseal region of the left mandible, concerning for acute infection/cellulitis. Extension to involve the adjacent submental region and left submandibular space. No overt extension into the floor of mouth to suggest Ludwig's angina at this time. There is a superimposed rim enhancing collection measuring 1.4 x 1.0 x 1.6 cm, consistent with abscess. Periapical lucency noted at the left mandibular canine, suspected to be the source of infection (series 3, image 55). Remainder of the oral cavity is grossly within normal limits. Oropharynx and nasopharynx within normal limits. No retropharyngeal collection or swelling. Epiglottis normal. Vallecula clear. Remainder of the hypopharynx and supraglottic larynx within normal limits. Glottis normal. Subglottic airway clear. Salivary glands: Salivary glands including the parotid  and submandibular glands are within normal limits. Thyroid: Normal. Lymph nodes: Mildly prominent submental nodes measure up to 7 mm. Left level 1 B node measures 12 mm. Findings presumably reactive. No other enlarged or pathologic adenopathy within the neck. Vascular: Normal intravascular enhancement seen throughout the neck. Limited intracranial: Unremarkable. Visualized orbits: Unremarkable. Mastoids and visualized paranasal sinuses: Visualized paranasal sinuses are largely clear. Subcentimeter osteoma noted at the right the ethmoidal air cells. Mastoid air cells and middle ear cavities are well pneumatized and free of fluid. Skeleton: No acute osseous finding. No discrete or worrisome osseous lesions. Upper chest: Visualized upper chest demonstrates no acute finding. Partially visualized lungs are clear. Other: None. IMPRESSION: 1. Asymmetric soft tissue swelling with inflammatory stranding adjacent to the anterior left mandible, concerning for acute infection/cellulitis. Superimposed 1.4 x 1.0 x 1.6 cm abscess as above. 2. Periapical lucency at the root of the left mandibular canine, suspected to be the source of infection. 3. Mildly prominent cervical adenopathy as above, likely reactive. Electronically Signed   By: Rise Mu M.D.   On: 10/10/2020 21:36    Procedures Procedures   Medications Ordered in ED Medications  sodium chloride 0.9 % bolus 500 mL (0 mLs Intravenous Stopped 10/10/20 2100)  clindamycin (CLEOCIN) IVPB 600 mg (0 mg Intravenous Stopped 10/10/20 2100)  iohexol (OMNIPAQUE) 300 MG/ML solution 75 mL (75 mLs Intravenous Contrast Given 10/10/20 2110)  morphine 2 MG/ML injection 2 mg (2 mg Intravenous Given 10/10/20 2122)  ondansetron (ZOFRAN) injection 2 mg (2 mg Intravenous Given 10/10/20 2215)    ED Course  I have reviewed the triage vital signs and the nursing notes.  Pertinent labs & imaging results that were available during my care of the patient were reviewed by me and  considered in my medical decision making (see chart for details).  MDM Rules/Calculators/A&P                         Additional history obtained from: 1. Nursing notes from this visit. ------------ 35 year old female presented for left lower dental pain and swelling onset 2 days ago.  She has swelling of the left anterior mandible, there is no swelling below the jaw.  Unable to visualize or palpate abscess on physical examination.  Will obtain CT imaging for better evaluation and rule out of Ludewig's angina.  Discussed risk versus benefits of CT imaging with the patient and she elects to proceed with CT soft tissue neck.  Will start patient on IV clindamycin for concern of infection. - Informed by RN that patient's beta-hCG was negative.  I-STAT Chem-8 reviewed shows mild hypokalemia 3.1, no emergent electrolyte derangement or AKI, hemoglobin within normal limits.  CT Soft Tissue Neck:  IMPRESSION:  1. Asymmetric soft tissue swelling with inflammatory stranding  adjacent to the anterior left mandible, concerning for acute  infection/cellulitis. Superimposed 1.4 x 1.0 x 1.6 cm abscess as  above.  2. Periapical lucency at the root of the left mandibular canine,  suspected to be the source of infection.  3. Mildly prominent cervical adenopathy as above, likely reactive.   Patient seen and evaluated by Dr. Rhunette Croft, plan of care is to start patient on clindamycin 300 mg 4 times daily and have her follow-up closely with her dentist.  Abscess not palpable here in the ER.  She reports that she follows with Dr. Aundria Rud in [facility].  I attempted to call their office to speak with her on-call dentist however there was no answer.  Since patient is established with the practice I encouraged her to call them tomorrow morning to schedule follow-up appointment for further treatment.  I also gave patient referral for Dr. Mart Piggs office no answer.  Patient encouraged to call his office  tomorrow morning to schedule follow-up appointment if she is unable to reach her dentist Dr. Aundria Rud.  Extensive return precautions were discussed with patient and she stated understanding.  At discharge she is in no acute distress tolerating p.o. without difficulty, airway intact.  At this time there does not appear to be any evidence of an acute emergency medical condition and the patient appears stable for discharge with appropriate outpatient follow up. Diagnosis was discussed with patient who verbalizes understanding of care plan and is agreeable to discharge. I have discussed return precautions with patient who verbalizes understanding. Patient encouraged to follow-up with their PCP and dentist. All questions answered.  Patient seen and evaluated by Dr. Rhunette Croft during this visit who agrees with Clindamycin and discharge.  Note: Portions of this report may have been transcribed using voice recognition software. Every effort was made to ensure accuracy; however, inadvertent computerized transcription errors may still be present. Final Clinical Impression(s) / ED Diagnoses Final diagnoses:  Dental abscess  Facial swelling    Rx / DC Orders ED Discharge Orders         Ordered    clindamycin (CLEOCIN) 150 MG capsule  4 times daily        10/10/20 2244    ondansetron (ZOFRAN ODT) 4 MG disintegrating tablet  Every 8 hours PRN        10/10/20 2314           Elizabeth Palau 10/10/20 2317    Derwood Kaplan, MD 10/11/20 1517

## 2020-10-14 LAB — I-STAT BETA HCG BLOOD, ED (MC, WL, AP ONLY): I-stat hCG, quantitative: <5 m[IU]/mL (ref <5)

## 2020-12-31 ENCOUNTER — Encounter: Payer: Self-pay | Admitting: *Deleted

## 2020-12-31 ENCOUNTER — Ambulatory Visit
Admission: EM | Admit: 2020-12-31 | Discharge: 2020-12-31 | Disposition: A | Discharge status: Home / Self Care | Payer: Medicaid Other | Attending: Emergency Medicine | Admitting: Emergency Medicine

## 2020-12-31 ENCOUNTER — Other Ambulatory Visit: Payer: Self-pay

## 2020-12-31 DIAGNOSIS — R21 Rash and other nonspecific skin eruption: Secondary | ICD-10-CM

## 2020-12-31 DIAGNOSIS — Z3201 Encounter for pregnancy test, result positive: Secondary | ICD-10-CM | POA: Diagnosis not present

## 2020-12-31 DIAGNOSIS — L299 Pruritus, unspecified: Secondary | ICD-10-CM

## 2020-12-31 HISTORY — DX: Depression, unspecified: F32.A

## 2020-12-31 LAB — POCT URINE PREGNANCY: Preg Test, Ur: POSITIVE — AB

## 2020-12-31 MED ORDER — MUPIROCIN 2 % EX OINT: 1.0000 "application " | TOPICAL_OINTMENT | Freq: Two times a day (BID) | CUTANEOUS | 0 refills | Status: DC

## 2020-12-31 NOTE — Discharge Instructions (Addendum)
Urine pregnancy positive Use OTC zyrtec, claritin, OR allegra for itching May use OTC calamine lotion as well Oatmeal baths may also be helpful Bactroban for possible infection Avoid hot showers/ baths Moisturize skin daily  Follow up with OB for further evaluation and management Return or go to the ER if you have any new or worsening symptoms such as fever, chills, nausea, vomiting, redness, swelling, discharge, if symptoms do not improve with medications, etc..Marland Kitchen

## 2020-12-31 NOTE — ED Provider Notes (Signed)
[facility] CARE CENTER   [id number] 12/31/20 Arrival Time: 0847  CC: SKIN COMPLAINT  SUBJECTIVE:  Roberta Baker is a 35 y.o. female who presents with a rash to bilateral underarms x 2 days.  Denies precipitating event or trauma.  Denies changes in soaps, detergents, close contacts with similar rash, known trigger or environmental trigger, allergy. Denies medications change or starting a new medication recently.  Localizes the rash to underarms.  Describes it as painful, red, and itchy.  Has tried OTC medications without relief.  Symptoms are made worse to the touch.  Denies similar symptoms in the past.   Denies fever, chills, nausea, vomiting, discharge, SOB, chest pain, abdominal pain, changes in bowel or bladder function.    Also reports late period  ROS: As per HPI.  All other pertinent ROS negative.     Past Medical History:  Diagnosis Date  . Anxiety   . Depression   . Headache   . Infection    UTI  . Kidney infection   . Kidney stones   . Nausea and vomiting in pregnancy 02/07/2017  . Pain, dental 02/07/2017  . 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 BREAST ENHANCEMENT SURGERY    . BREAST SURGERY     Allergies  Allergen Reactions  . Sulfa Antibiotics Nausea And Vomiting   No current facility-administered medications on file prior to encounter.   Current Outpatient Medications on File Prior to Encounter  Medication Sig Dispense Refill  . [DISCONTINUED] medroxyPROGESTERone Acetate 150 MG/ML SUSY  INJECT 1 ML INTO THE MUSCLE EVERY 3 MONTHS AS DIRECTED 1 mL 3   Social History   Socioeconomic History  . Marital status: Single    Spouse name: Not on file  . Number of children: 1  . Years of education: Not on file  . Highest education level: Not on file  Occupational History  . Not on file  Tobacco Use  . Smoking status: Current Every Day Smoker    Packs/day: 0.25    Years: 0.50    Pack years:  0.12    Types: Cigarettes  . Smokeless tobacco: Never Used  Vaping Use  . Vaping Use: Never used  Substance and Sexual Activity  . Alcohol use: No  . Drug use: Yes    Types: Marijuana    Comment: occasional  . Sexual activity: Yes    Birth control/protection: None  Other Topics Concern  . Not on file  Social History Narrative  . Not on file   Social Determinants of Health   Financial Resource Strain: Not on file  Food Insecurity: Not on file  Transportation Needs: Not on file  Physical Activity: Not on file  Stress: Not on file  Social Connections: Not on file  Intimate Partner Violence: Not on file   Family History  Problem Relation Age of Onset  . Mental illness Mother   . Seizures Mother   . Cancer Maternal Uncle   . Cancer Maternal Grandmother        breast cancer  . Heart attack Maternal Grandfather     OBJECTIVE: Vitals:   12/31/20 0855  BP: 102/66  Pulse: (!) 113  Resp: 16  Temp: 99 F (37.2 C)  TempSrc: Oral  SpO2: 98%    General appearance: alert; no distress Head: NCAT Lungs: normal respiratory effort Extremities: no edema Skin: warm and dry; erythematous macular rash to bilateral underarms, TTP, no  obvious drainage, bleeding or wounds, blanches with pressure; irregular distribution Psychological: alert and cooperative; normal mood and affect  ASSESSMENT & PLAN:  1. Positive urine pregnancy test   2. Rash and nonspecific skin eruption   3. Itching     Meds ordered this encounter  Medications  . mupirocin ointment (BACTROBAN) 2 %    Sig: Apply 1 application topically 2 (two) times daily.    Dispense:  30 g    Refill:  0    Order Specific Question:   Supervising Provider    Answer:   Eustace Moore [[id number]]   Urine pregnancy positive Use OTC zyrtec, claritin, OR allegra for itching May use OTC calamine lotion as well Oatmeal baths may also be helpful Bactroban for possible infection Avoid hot showers/ baths Moisturize skin  daily  Follow up with OB for further evaluation and management Return or go to the ER if you have any new or worsening symptoms such as fever, chills, nausea, vomiting, redness, swelling, discharge, if symptoms do not improve with medications, etc...  Reviewed expectations re: course of current medical issues. Questions answered. Outlined signs and symptoms indicating need for more acute intervention. Patient verbalized understanding. After Visit Summary given.   Rennis Harding, PA-C 12/31/20 [phone]

## 2020-12-31 NOTE — ED Triage Notes (Signed)
Pt states being awoken during night 2 days ago with pain to bilat axillae.  Denies any lumps.  Red, inflamed skin noted to areas.  Has tried aloe, Aquaphor, hydrocortisone cream without relief.  States hydrocortisone made it worse. C/O late menstrual period.

## 2021-01-01 ENCOUNTER — Encounter (HOSPITAL_COMMUNITY): Payer: Self-pay | Admitting: Obstetrics and Gynecology

## 2021-01-01 ENCOUNTER — Inpatient Hospital Stay (HOSPITAL_COMMUNITY)
Admission: AD | Admit: 2021-01-01 | Discharge: 2021-01-01 | Disposition: A | Discharge status: Home / Self Care | Payer: Medicaid Other | Attending: Obstetrics and Gynecology | Admitting: Obstetrics and Gynecology

## 2021-01-01 ENCOUNTER — Other Ambulatory Visit: Payer: Self-pay

## 2021-01-01 DIAGNOSIS — Z3A01 Less than 8 weeks gestation of pregnancy: Secondary | ICD-10-CM | POA: Diagnosis not present

## 2021-01-01 DIAGNOSIS — R21 Rash and other nonspecific skin eruption: Secondary | ICD-10-CM

## 2021-01-01 DIAGNOSIS — O99891 Other specified diseases and conditions complicating pregnancy: Secondary | ICD-10-CM

## 2021-01-01 MED ORDER — LIDOCAINE 2 % EX GEL: 1.0000 "application " | CUTANEOUS | 0 refills | Status: DC | PRN

## 2021-01-01 MED ORDER — CLOBETASOL PROPIONATE 0.05 % EX OINT: 1.0000 "application " | TOPICAL_OINTMENT | Freq: Two times a day (BID) | CUTANEOUS | 0 refills | Status: DC

## 2021-01-01 NOTE — Discharge Instructions (Signed)

## 2021-01-01 NOTE — MAU Note (Signed)
.   Roberta Baker is a 35 y.o. at [redacted]w[redacted]d here in MAU reporting: rash and pain under both of her axilla that started on Friday around 0330 in the morning. She states that it burns and itches. Has not used any new detergents or deodorants. No VB or abdominal pain. Denies pregnancy complaints.   Pain score: 8 Vitals:   01/01/21 1803  BP: 104/64  Pulse: (!) 101  Resp: 15  Temp: 98.3 F (36.8 C)  SpO2: 100%

## 2021-01-01 NOTE — MAU Provider Note (Signed)
Event Date/Time   First Provider Initiated Contact with Patient 01/01/21 1809       S [name] is a 35 y.o. G2P1001 patient who presents to MAU today with complaint of armpit rash. She was seen at urgent care yesterday and diagnosed with a contact dermatitis and given bactroban cream. She states it is not helping and her underarms feels like "someone is holding a lighter to them." She denies any pregnancy complaints, denies abdominal pain, vaginal bleeding or discharge.   O BP 104/64 (BP Location: Right Arm)   Pulse (!) 101   Temp 98.3 F (36.8 C) (Oral)   Resp 15   LMP 11/28/2020 (Exact Date)   SpO2 100%  Physical Exam Vitals and nursing note reviewed.  Constitutional:      General: She is not in acute distress.    Appearance: She is well-developed.  HENT:     Head: Normocephalic.  Eyes:     Pupils: Pupils are equal, round, and reactive to light.  Cardiovascular:     Rate and Rhythm: Normal rate and regular rhythm.     Heart sounds: Normal heart sounds.  Pulmonary:     Effort: Pulmonary effort is normal. No respiratory distress.     Breath sounds: Normal breath sounds.  Abdominal:     General: Bowel sounds are normal. There is no distension.     Palpations: Abdomen is soft.     Tenderness: There is no abdominal tenderness.  Skin:    General: Skin is warm and dry.     Comments: Bright red rash noted under both armpits  Neurological:     Mental Status: She is alert and oriented to person, place, and time.  Psychiatric:        Behavior: Behavior normal.        Thought Content: Thought content normal.        Judgment: Judgment normal.     A Medical screening exam complete 1. Rash   2. [redacted] weeks gestation of pregnancy    P Discharge from MAU in stable condition Patient given the option of transfer to [name] for further evaluation or seek care in outpatient facility of choice List of options for follow-up given  -RX for clobetasol cream and lidocaine gel  sent to patient's pharmacy -Message sent to [facility] to follow up this week Warning signs for worsening condition that would warrant emergency follow-up discussed Patient may return to MAU as needed   Rolm Bookbinder, [place] 01/01/2021 6:28 PM

## 2021-01-02 ENCOUNTER — Telehealth: Payer: Self-pay | Admitting: Women's Health

## 2021-01-02 NOTE — Telephone Encounter (Signed)
I received a message from MAU that patient has been seen in the Urgent care and the hospital for a rash and they would like for [place] to schedule an appointment in the office, I tried calling patient and left a message for her to Contact us so we could get her schedule.

## 2021-01-03 ENCOUNTER — Telehealth: Payer: Self-pay | Admitting: Women's Health

## 2021-01-03 ENCOUNTER — Other Ambulatory Visit: Payer: Self-pay | Admitting: Women's Health

## 2021-01-03 MED ORDER — DOXYLAMINE-PYRIDOXINE 10-10 MG PO TBEC: DELAYED_RELEASE_TABLET | ORAL | 6 refills | Status: DC

## 2021-01-03 NOTE — Telephone Encounter (Signed)
Pt is about [redacted] weeks pregnant, confirmed at ED, having lots of nausea, unable to keep anything down, wonders if we can order something for nausea  Has appt here 5/10 for a rash & 5/18 for her dating U/S  Please advise & call pt    [facility]

## 2021-01-03 NOTE — Telephone Encounter (Signed)
Patient wants the [facility] pharmacy to to removed from her chart and keep [facility] on [address]... The prescription that was sent to [name] is okay for today.

## 2021-01-10 ENCOUNTER — Other Ambulatory Visit: Payer: Self-pay

## 2021-01-10 ENCOUNTER — Ambulatory Visit (INDEPENDENT_AMBULATORY_CARE_PROVIDER_SITE_OTHER): Payer: Medicaid Other | Admitting: Obstetrics & Gynecology

## 2021-01-10 ENCOUNTER — Encounter: Payer: Self-pay | Admitting: Obstetrics & Gynecology

## 2021-01-10 VITALS — BP 101/70 | HR 101 | Ht 60.0 in | Wt 102.0 lb

## 2021-01-10 DIAGNOSIS — L230 Allergic contact dermatitis due to metals: Secondary | ICD-10-CM

## 2021-01-10 MED ORDER — DOXYLAMINE-PYRIDOXINE 10-10 MG PO TBEC
DELAYED_RELEASE_TABLET | ORAL | 6 refills | Status: DC
Start: 2021-01-10 — End: 2021-02-28

## 2021-01-10 MED ORDER — PROMETHAZINE HCL 25 MG PO TABS: 25.0000 mg | ORAL_TABLET | Freq: Four times a day (QID) | ORAL | 1 refills | Status: DC | PRN

## 2021-01-10 NOTE — Progress Notes (Signed)
Chief Complaint  Patient presents with  . discuss rash under arms    Rash is better now      35 y.o. G2P1001 Patient's last menstrual period was 11/28/2020 (exact date). The current method of family planning is pregnant.  Outpatient Encounter Medications as of 01/10/2021  Medication Sig  . promethazine (PHENERGAN) 25 MG tablet Take 1 tablet (25 mg total) by mouth every 6 (six) hours as needed for nausea or vomiting.  . [DISCONTINUED] Doxylamine-Pyridoxine (DICLEGIS) 10-10 MG TBEC 2 tabs q hs, if sx persist add 1 tab q am on day 3, if sx persist add 1 tab q afternoon on day 4  . Doxylamine-Pyridoxine (DICLEGIS) 10-10 MG TBEC 2 tablets twice daily  . [DISCONTINUED] clobetasol ointment (TEMOVATE) 0.05 % Apply 1 application topically 2 (two) times daily.  . [DISCONTINUED] Lidocaine 2 % GEL Apply 1 application topically as needed.  . [DISCONTINUED] medroxyPROGESTERone Acetate 150 MG/ML SUSY  INJECT 1 ML INTO THE MUSCLE EVERY 3 MONTHS AS DIRECTED   No facility-administered encounter medications on file as of 01/10/2021.    Subjective Pt with 2 weeks of severe axillary rash Much improved since stopping all axillary substances Not yeast Past Medical History:  Diagnosis Date  . Anxiety   . Depression   . Headache   . Infection    UTI  . Kidney infection   . Kidney stones   . Nausea and vomiting in pregnancy 02/07/2017  . Pain, dental 02/07/2017  . 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 BREAST ENHANCEMENT SURGERY    . BREAST SURGERY      OB History    Gravida  2   Para  1   Term  1   Preterm      AB  0   Living  1     SAB      IAB      Ectopic      Multiple  0   Live Births              Allergies  Allergen Reactions  . Sulfa Antibiotics Nausea And Vomiting    Social History   Socioeconomic History  . Marital status: Single    Spouse name: Not on file  . Number of children: 1   . Years of education: Not on file  . Highest education level: Not on file  Occupational History  . Not on file  Tobacco Use  . Smoking status: Current Some Day Smoker    Packs/day: 0.25    Years: 0.50    Pack years: 0.12    Types: Cigarettes  . Smokeless tobacco: Never Used  Vaping Use  . Vaping Use: Never used  Substance and Sexual Activity  . Alcohol use: No  . Drug use: Yes    Types: Marijuana    Comment: occasional  . Sexual activity: Yes    Birth control/protection: None  Other Topics Concern  . Not on file  Social History Narrative  . Not on file   Social Determinants of Health   Financial Resource Strain: Not on file  Food Insecurity: Not on file  Transportation Needs: Not on file  Physical Activity: Not on file  Stress: Not on file  Social Connections: Not on file    Family History  Problem Relation Age of Onset  . Mental illness Mother   . Seizures Mother   .  Cancer Maternal Uncle   . Cancer Maternal Grandmother        breast cancer  . Heart attack Maternal Grandfather     Medications:       Current Outpatient Medications:  .  promethazine (PHENERGAN) 25 MG tablet, Take 1 tablet (25 mg total) by mouth every 6 (six) hours as needed for nausea or vomiting., Disp: 30 tablet, Rfl: 1 .  Doxylamine-Pyridoxine (DICLEGIS) 10-10 MG TBEC, 2 tablets twice daily, Disp: 100 tablet, Rfl: 6  Objective Blood pressure 101/70, pulse (!) 101, height 5' (1.524 m), weight 102 lb (46.3 kg), last menstrual period 11/28/2020.  Pictures viewed with severe contact dermatitis, much better now Not using anything under the arms at present Much much better   Pertinent ROS No burning with urination, frequency or urgency No nausea, vomiting or diarrhea Nor fever chills or other constitutional symptoms   Labs or studies No new    Impression Diagnoses this Encounter::   ICD-10-CM   1. 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 promethazine (PHENERGAN) 25 MG tablet    Sig: Take 1 tablet (25 mg total) by mouth every 6 (six) hours as needed for nausea or vomiting.    Dispense:  30 tablet    Refill:  1  . Doxylamine-Pyridoxine (DICLEGIS) 10-10 MG TBEC    Sig: 2 tablets twice daily    Dispense:  100 tablet    Refill:  6    Labs or Scans Ordered: No orders of the defined types were placed in this encounter.   Management:: Col 100% aloe vera topically prn  Make NOB appt for 1 week later than scheduled  Follow up Return for cancel her scheduled appt and make for 1 week later sonogram + appt.       All questions were answered.

## 2021-01-18 ENCOUNTER — Other Ambulatory Visit: Payer: Medicaid Other

## 2021-01-27 ENCOUNTER — Other Ambulatory Visit: Payer: Self-pay | Admitting: Obstetrics & Gynecology

## 2021-01-27 DIAGNOSIS — O3680X0 Pregnancy with inconclusive fetal viability, not applicable or unspecified: Secondary | ICD-10-CM

## 2021-01-31 ENCOUNTER — Other Ambulatory Visit: Payer: Self-pay

## 2021-01-31 ENCOUNTER — Ambulatory Visit (INDEPENDENT_AMBULATORY_CARE_PROVIDER_SITE_OTHER): Payer: Medicaid Other

## 2021-01-31 DIAGNOSIS — Z3A09 9 weeks gestation of pregnancy: Secondary | ICD-10-CM

## 2021-01-31 DIAGNOSIS — O3680X0 Pregnancy with inconclusive fetal viability, not applicable or unspecified: Secondary | ICD-10-CM

## 2021-01-31 NOTE — Progress Notes (Signed)
[place] 9+1 wks,single IUP with YS,positive FHT 171 bpm,CRL 24.8 mm,normal ovaries

## 2021-02-27 ENCOUNTER — Other Ambulatory Visit: Payer: Self-pay | Admitting: Obstetrics & Gynecology

## 2021-02-27 DIAGNOSIS — Z3682 Encounter for antenatal screening for nuchal translucency: Secondary | ICD-10-CM

## 2021-02-28 ENCOUNTER — Ambulatory Visit (INDEPENDENT_AMBULATORY_CARE_PROVIDER_SITE_OTHER): Payer: Medicaid Other

## 2021-02-28 ENCOUNTER — Ambulatory Visit: Payer: Medicaid Other | Admitting: *Deleted

## 2021-02-28 ENCOUNTER — Other Ambulatory Visit: Payer: Self-pay

## 2021-02-28 ENCOUNTER — Other Ambulatory Visit (HOSPITAL_COMMUNITY)
Admission: RE | Admit: 2021-02-28 | Discharge: 2021-02-28 | Disposition: A | Discharge status: Home / Self Care | Payer: Medicaid Other | Source: Ambulatory Visit | Attending: Obstetrics & Gynecology | Admitting: Obstetrics & Gynecology

## 2021-02-28 ENCOUNTER — Encounter: Payer: Self-pay | Admitting: Women's Health

## 2021-02-28 ENCOUNTER — Ambulatory Visit (INDEPENDENT_AMBULATORY_CARE_PROVIDER_SITE_OTHER): Payer: Medicaid Other | Admitting: Women's Health

## 2021-02-28 VITALS — BP 108/53 | HR 97 | Wt 104.0 lb

## 2021-02-28 DIAGNOSIS — Z124 Encounter for screening for malignant neoplasm of cervix: Secondary | ICD-10-CM | POA: Diagnosis present

## 2021-02-28 DIAGNOSIS — Z3490 Encounter for supervision of normal pregnancy, unspecified, unspecified trimester: Secondary | ICD-10-CM | POA: Insufficient documentation

## 2021-02-28 DIAGNOSIS — F17200 Nicotine dependence, unspecified, uncomplicated: Secondary | ICD-10-CM | POA: Insufficient documentation

## 2021-02-28 DIAGNOSIS — Z3682 Encounter for antenatal screening for nuchal translucency: Secondary | ICD-10-CM | POA: Diagnosis not present

## 2021-02-28 DIAGNOSIS — Z3480 Encounter for supervision of other normal pregnancy, unspecified trimester: Secondary | ICD-10-CM

## 2021-02-28 DIAGNOSIS — Z113 Encounter for screening for infections with a predominantly sexual mode of transmission: Secondary | ICD-10-CM | POA: Insufficient documentation

## 2021-02-28 DIAGNOSIS — Z3A13 13 weeks gestation of pregnancy: Secondary | ICD-10-CM

## 2021-02-28 DIAGNOSIS — Z363 Encounter for antenatal screening for malformations: Secondary | ICD-10-CM

## 2021-02-28 DIAGNOSIS — Z3481 Encounter for supervision of other normal pregnancy, first trimester: Secondary | ICD-10-CM

## 2021-02-28 DIAGNOSIS — F418 Other specified anxiety disorders: Secondary | ICD-10-CM

## 2021-02-28 LAB — POCT URINALYSIS DIPSTICK OB
Blood, UA: NEGATIVE
Glucose, UA: NEGATIVE
Ketones, UA: NEGATIVE
Leukocytes, UA: NEGATIVE
Nitrite, UA: NEGATIVE
POC,PROTEIN,UA: NEGATIVE

## 2021-02-28 MED ORDER — HYDROXYZINE PAMOATE 25 MG PO CAPS: 25.0000 mg | ORAL_CAPSULE | Freq: Two times a day (BID) | ORAL | 3 refills | Status: DC | PRN

## 2021-02-28 MED ORDER — BLOOD PRESSURE MONITOR MISC: 0 refills | Status: DC

## 2021-02-28 NOTE — Progress Notes (Signed)
[place] 13+1 wks,measurements c/w dates,NB present,NT 1.5 mm,normal ovaries,posterior placenta,CRL 74.80 mm,fhr 155 bpm

## 2021-02-28 NOTE — Progress Notes (Signed)
INITIAL OBSTETRICAL VISIT Patient name: Roberta Baker MRN [id number]  Date of birth: [birth year] Chief Complaint:   Initial Prenatal Visit (nausea)  History of Present Illness:   Roberta Baker is a 35 y.o. G35P1001 Caucasian female at [redacted]w[redacted]d by LMP c/w u/s at 9 weeks with an Estimated Date of Delivery: 09/04/21 being seen today for her initial obstetrical visit.   Patient's last menstrual period was 11/28/2020 (exact date). Her obstetrical history is significant for  term SVB x1 w/ FGR .   Today she reports  anxiety and N/V. Has diclegis and phenergan. Smoker: 1/2ppd, wants to quit, just not ready right now Last pap 12/12/16. Results were: NILM w/ HRHPV negative  Depression screen [name] 2/9 02/28/2021 06/07/2017 12/12/2016  Decreased Interest 1 0 0  Down, Depressed, Hopeless 3 0 1  PHQ - 2 Score 4 0 1  Altered sleeping 2 0 1  Tired, decreased energy 3 0 1  Change in appetite 2 0 -  Feeling bad or failure about yourself  3 0 0  Trouble concentrating 1 0 0  Moving slowly or fidgety/restless 0 0 0  Suicidal thoughts 0 0 0  PHQ-9 Score 15 0 3  Difficult doing work/chores - Not difficult at all -     GAD 7 : Generalized Anxiety Score 02/28/2021  Nervous, Anxious, on Edge 3  Control/stop worrying 3  Worry too much - different things 3  Trouble relaxing 3  Restless 2  Easily annoyed or irritable 3  Afraid - awful might happen 3  Total GAD 7 Score 20     Review of Systems:   Pertinent items are noted in HPI Denies cramping/contractions, leakage of fluid, vaginal bleeding, abnormal vaginal discharge w/ itching/odor/irritation, headaches, visual changes, shortness of breath, chest pain, abdominal pain, severe nausea/vomiting, or problems with urination or bowel movements unless otherwise stated above.  Pertinent History Reviewed:  Reviewed past medical,surgical, social, obstetrical and family history.  Reviewed problem list, medications and allergies. OB History  Gravida  Para Term Preterm AB Living  2 1 1    0 1  SAB IAB Ectopic Multiple Live Births        0      # Outcome Date GA Lbr Len/2nd Weight Sex Delivery Anes PTL Lv  2 Current           1 Term 04/17/17 [redacted]w[redacted]d 12:26 / 00:18 6 lb 1.4 oz (2.761 kg) M Vag-Vacuum EPI  LIV   Physical Assessment:   Vitals:   02/28/21 0954  BP: (!) 108/53  Pulse: 97  There is no height or weight on file to calculate BMI.       Physical Examination:  General appearance - well appearing, and in no distress  Mental status - alert, oriented to person, place, and time  Psych:  She has a normal mood and affect  Skin - warm and dry, normal color, no suspicious lesions noted  Chest - effort normal, all lung fields clear to auscultation bilaterally  Heart - normal rate and regular rhythm  Abdomen - soft, nontender  Extremities:  No swelling or varicosities noted  Pelvic - VULVA: normal appearing vulva with no masses, tenderness or lesions  VAGINA: normal appearing vagina with normal color and discharge, no lesions  CERVIX: normal appearing cervix without discharge or lesions, no CMT  Thin prep pap is done w/ HR HPV cotesting  Chaperone: Peggy Dones    TODAY'S NT  03/02/21 13+1 wks,measurements c/w dates,NB present,NT 1.5  mm,normal ovaries,posterior placenta,CRL 74.80 mm,fhr 155 bpm  Results for orders placed or performed in visit on 02/28/21 (from the past 24 hour(s))  POC Urinalysis Dipstick OB   Collection Time: 02/28/21 10:27 AM  Result Value Ref Range   Color, UA     Clarity, UA     Glucose, UA Negative Negative   Bilirubin, UA     Ketones, UA neg    Spec Grav, UA     Blood, UA neg    pH, UA     POC,PROTEIN,UA Negative Negative, Trace, Small (1+), Moderate (2+), Large (3+), 4+   Urobilinogen, UA     Nitrite, UA neg    Leukocytes, UA Negative Negative   Appearance     Odor      Assessment & Plan:  1) Low-Risk Pregnancy G2P1001 at [redacted]w[redacted]d with an Estimated Date of Delivery: 09/04/21   2) Initial OB visit  3)  N/V> refilled diclegis, continue w/ phenergan, let us know if needs anything else  4) Dep/anx> declines antidepressant, discussed options for anxiety, wants vistaril. Rx sent. [facility] referral ordered  5) Smoker> 1/2ppd, wants to quit, not ready right now, will let us know and we will send QuitlineNC  6) H/O FGR  Meds:  Meds ordered this encounter  Medications   Blood Pressure Monitor MISC    Sig: For regular home bp monitoring during pregnancy    Dispense:  1 each    Refill:  0    Z34.81 Please mail to patient   hydrOXYzine (VISTARIL) 25 MG capsule    Sig: Take 1 capsule (25 mg total) by mouth 2 (two) times daily as needed for anxiety.    Dispense:  60 capsule    Refill:  3    Order Specific Question:   Supervising Provider    Answer:   Duane Lope H [[id number]]    Initial labs obtained Continue prenatal vitamins Reviewed n/v relief measures and warning s/s to report Reviewed recommended weight gain based on pre-gravid BMI Encouraged well-balanced diet Genetic & carrier screening discussed: requests Panorama, NT/IT, and Horizon ,  Ultrasound discussed; fetal survey: requested CCNC completed> form faxed if has or is planning to apply for medicaid The nature of [place] [facility] [employer] with multiple MDs and other Advanced Practice Providers was explained to patient; also emphasized that fellows, residents, and students are part of our team. Does not have home bp cuff. Office bp cuff given: no. Rx sent: yes. Check bp weekly, let us know if consistently >140/90.   Follow-up: Return in about 5 weeks (around 04/04/2021) for LROB, OV:ZCHYIFO, CNM, in person, 2nd IT.   Orders Placed This Encounter  Procedures   Urine Culture   US OB Comp + 14 Wk   Integrated 1   Genetic Screening   Pain Management Screening Profile (10S)   CBC/D/Plt+RPR+Rh+ABO+RubIgG...   Amb ref to Integrated Behavioral Health   POC Urinalysis Dipstick OB    Cheral Marker CNM,  [name] 02/28/2021 10:44 AM

## 2021-02-28 NOTE — Patient Instructions (Signed)
Roberta Baker, thank you for choosing our office today! We appreciate the opportunity to meet your healthcare needs. You may receive a short survey by mail, e-mail, or through [employer]. If you are happy with your care we would appreciate if you could take just a few minutes to complete the survey questions. We read all of your comments and take your feedback very seriously. Thank you again for choosing our office.  [facility] [employer] [facility] Team at [name]  [facility] at [facility] ([address] [place], [place] [address]) Entrance C, located off of [address] Free 24/7 valet parking   Nausea & Vomiting Have saltine crackers or pretzels by your bed and eat a few bites before you raise your head out of bed in the morning Eat small frequent meals throughout the day instead of large meals Drink plenty of fluids throughout the day to stay hydrated, just don't drink a lot of fluids with your meals.  This can make your stomach fill up faster making you feel sick Do not brush your teeth right after you eat Products with real ginger are good for nausea, like ginger ale and ginger hard candy Make sure it says made with real ginger! Sucking on sour candy like lemon heads is also good for nausea If your prenatal vitamins make you nauseated, take them at night so you will sleep through the nausea Sea Bands If you feel like you need medicine for the nausea & vomiting please let us know If you are unable to keep any fluids or food down please let us know   Constipation Drink plenty of fluid, preferably water, throughout the day Eat foods high in fiber such as fruits, vegetables, and grains Exercise, such as walking, is a good way to keep your bowels regular Drink warm fluids, especially warm prune juice, or decaf coffee Eat a 1/2 cup of real oatmeal (not instant), 1/2 cup applesauce, and 1/2-1 cup warm prune juice every day If needed, you may take Colace (docusate sodium) stool  softener once or twice a day to help keep the stool soft.  If you still are having problems with constipation, you may take Miralax once daily as needed to help keep your bowels regular.   Home Blood Pressure Monitoring for Patients   Your provider has recommended that you check your blood pressure (BP) at least once a week at home. If you do not have a blood pressure cuff at home, one will be provided for you. Contact your provider if you have not received your monitor within 1 week.   Helpful Tips for Accurate Home Blood Pressure Checks  Don't smoke, exercise, or drink caffeine 30 minutes before checking your BP Use the restroom before checking your BP (a full bladder can raise your pressure) Relax in a comfortable upright chair Feet on the ground Left arm resting comfortably on a flat surface at the level of your heart Legs uncrossed Back supported Sit quietly and don't talk Place the cuff on your bare arm Adjust snuggly, so that only two fingertips can fit between your skin and the top of the cuff Check 2 readings separated by at least one minute Keep a log of your BP readings For a visual, please reference this diagram: http://ccnc.care/bpdiagram  Provider Name: [facility]     Phone: [phone]  Zone 1: ALL CLEAR  Continue to monitor your symptoms:  BP reading is less than 140 (top number) or less than 90 (bottom  number)  No right upper stomach pain No headaches or seeing spots No feeling nauseated or throwing up No swelling in face and hands  Zone 2: CAUTION Call your doctor's office for any of the following:  BP reading is greater than 140 (top number) or greater than 90 (bottom number)  Stomach pain under your ribs in the middle or right side Headaches or seeing spots Feeling nauseated or throwing up Swelling in face and hands  Zone 3: EMERGENCY  Seek immediate medical care if you have any of the following:  BP reading is greater than160 (top number) or  greater than 110 (bottom number) Severe headaches not improving with Tylenol Serious difficulty catching your breath Any worsening symptoms from Zone 2    First Trimester of Pregnancy The first trimester of pregnancy is from week 1 until the end of week 12 (months 1 through 3). A week after a sperm fertilizes an egg, the egg will implant on the wall of the uterus. This embryo will begin to develop into a baby. Genes from you and your partner are forming the baby. The female genes determine whether the baby is a boy or a girl. At 6-8 weeks, the eyes and face are formed, and the heartbeat can be seen on ultrasound. At the end of 12 weeks, all the baby's organs are formed.  Now that you are pregnant, you will want to do everything you can to have a healthy baby. Two of the most important things are to get good prenatal care and to follow your health care provider's instructions. Prenatal care is all the medical care you receive before the baby's birth. This care will help prevent, find, and treat any problems during the pregnancy and childbirth. BODY CHANGES Your body goes through many changes during pregnancy. The changes vary from woman to woman.  You may gain or lose a couple of pounds at first. You may feel sick to your stomach (nauseous) and throw up (vomit). If the vomiting is uncontrollable, call your health care provider. You may tire easily. You may develop headaches that can be relieved by medicines approved by your health care provider. You may urinate more often. Painful urination may mean you have a bladder infection. You may develop heartburn as a result of your pregnancy. You may develop constipation because certain hormones are causing the muscles that push waste through your intestines to slow down. You may develop hemorrhoids or swollen, bulging veins (varicose veins). Your breasts may begin to grow larger and become tender. Your nipples may stick out more, and the tissue that  surrounds them (areola) may become darker. Your gums may bleed and may be sensitive to brushing and flossing. Dark spots or blotches (chloasma, mask of pregnancy) may develop on your face. This will likely fade after the baby is born. Your menstrual periods will stop. You may have a loss of appetite. You may develop cravings for certain kinds of food. You may have changes in your emotions from day to day, such as being excited to be pregnant or being concerned that something may go wrong with the pregnancy and baby. You may have more vivid and strange dreams. You may have changes in your hair. These can include thickening of your hair, rapid growth, and changes in texture. Some women also have hair loss during or after pregnancy, or hair that feels dry or thin. Your hair will most likely return to normal after your baby is born. WHAT TO EXPECT AT YOUR PRENATAL  VISITS During a routine prenatal visit: You will be weighed to make sure you and the baby are growing normally. Your blood pressure will be taken. Your abdomen will be measured to track your baby's growth. The fetal heartbeat will be listened to starting around week 10 or 12 of your pregnancy. Test results from any previous visits will be discussed. Your health care provider may ask you: How you are feeling. If you are feeling the baby move. If you have had any abnormal symptoms, such as leaking fluid, bleeding, severe headaches, or abdominal cramping. If you have any questions. Other tests that may be performed during your first trimester include: Blood tests to find your blood type and to check for the presence of any previous infections. They will also be used to check for low iron levels (anemia) and Rh antibodies. Later in the pregnancy, blood tests for diabetes will be done along with other tests if problems develop. Urine tests to check for infections, diabetes, or protein in the urine. An ultrasound to confirm the proper growth  and development of the baby. An amniocentesis to check for possible genetic problems. Fetal screens for spina bifida and Down syndrome. You may need other tests to make sure you and the baby are doing well. HOME CARE INSTRUCTIONS  Medicines Follow your health care provider's instructions regarding medicine use. Specific medicines may be either safe or unsafe to take during pregnancy. Take your prenatal vitamins as directed. If you develop constipation, try taking a stool softener if your health care provider approves. Diet Eat regular, well-balanced meals. Choose a variety of foods, such as meat or vegetable-based protein, fish, milk and low-fat dairy products, vegetables, fruits, and whole grain breads and cereals. Your health care provider will help you determine the amount of weight gain that is right for you. Avoid raw meat and uncooked cheese. These carry germs that can cause birth defects in the baby. Eating four or five small meals rather than three large meals a day may help relieve nausea and vomiting. If you start to feel nauseous, eating a few soda crackers can be helpful. Drinking liquids between meals instead of during meals also seems to help nausea and vomiting. If you develop constipation, eat more high-fiber foods, such as fresh vegetables or fruit and whole grains. Drink enough fluids to keep your urine clear or pale yellow. Activity and Exercise Exercise only as directed by your health care provider. Exercising will help you: Control your weight. Stay in shape. Be prepared for labor and delivery. Experiencing pain or cramping in the lower abdomen or low back is a good sign that you should stop exercising. Check with your health care provider before continuing normal exercises. Try to avoid standing for long periods of time. Move your legs often if you must stand in one place for a long time. Avoid heavy lifting. Wear low-heeled shoes, and practice good posture. You may  continue to have sex unless your health care provider directs you otherwise. Relief of Pain or Discomfort Wear a good support bra for breast tenderness.   Take warm sitz baths to soothe any pain or discomfort caused by hemorrhoids. Use hemorrhoid cream if your health care provider approves.   Rest with your legs elevated if you have leg cramps or low back pain. If you develop varicose veins in your legs, wear support hose. Elevate your feet for 15 minutes, 3-4 times a day. Limit salt in your diet. Prenatal Care Schedule your prenatal visits by the  twelfth week of pregnancy. They are usually scheduled monthly at first, then more often in the last 2 months before delivery. Write down your questions. Take them to your prenatal visits. Keep all your prenatal visits as directed by your health care provider. Safety Wear your seat belt at all times when driving. Make a list of emergency phone numbers, including numbers for family, friends, the hospital, and police and fire departments. General Tips Ask your health care provider for a referral to a local prenatal education class. Begin classes no later than at the beginning of month 6 of your pregnancy. Ask for help if you have counseling or nutritional needs during pregnancy. Your health care provider can offer advice or refer you to specialists for help with various needs. Do not use hot tubs, steam rooms, or saunas. Do not douche or use tampons or scented sanitary pads. Do not cross your legs for long periods of time. Avoid cat litter boxes and soil used by cats. These carry germs that can cause birth defects in the baby and possibly loss of the fetus by miscarriage or stillbirth. Avoid all smoking, herbs, alcohol, and medicines not prescribed by your health care provider. Chemicals in these affect the formation and growth of the baby. Schedule a dentist appointment. At home, brush your teeth with a soft toothbrush and be gentle when you floss. SEEK  MEDICAL CARE IF:  You have dizziness. You have mild pelvic cramps, pelvic pressure, or nagging pain in the abdominal area. You have persistent nausea, vomiting, or diarrhea. You have a bad smelling vaginal discharge. You have pain with urination. You notice increased swelling in your face, hands, legs, or ankles. SEEK IMMEDIATE MEDICAL CARE IF:  You have a fever. You are leaking fluid from your vagina. You have spotting or bleeding from your vagina. You have severe abdominal cramping or pain. You have rapid weight gain or loss. You vomit blood or material that looks like coffee grounds. You are exposed to [place] measles and have never had them. You are exposed to fifth disease or chickenpox. You develop a severe headache. You have shortness of breath. You have any kind of trauma, such as from a fall or a car accident. Document Released: 08/14/2001 Document Revised: 01/04/2014 Document Reviewed: 06/30/2013 [facility] Patient Information 2015 [place], [place]. This information is not intended to replace advice given to you by your health care provider. Make sure you discuss any questions you have with your health care provider.

## 2021-03-01 LAB — PMP SCREEN PROFILE (10S), URINE
Amphetamine Scrn, Ur: NEGATIVE ng/mL
BARBITURATE SCREEN URINE: NEGATIVE ng/mL
BENZODIAZEPINE SCREEN, URINE: NEGATIVE ng/mL
CANNABINOIDS UR QL SCN: POSITIVE ng/mL — AB
Cocaine (Metab) Scrn, Ur: NEGATIVE ng/mL
Creatinine(Crt), U: 248 mg/dL (ref 20.0–300.0)
Methadone Screen, Urine: NEGATIVE ng/mL
OXYCODONE+OXYMORPHONE UR QL SCN: NEGATIVE ng/mL
Opiate Scrn, Ur: NEGATIVE ng/mL
Ph of Urine: 6.2 (ref 4.5–8.9)
Phencyclidine Qn, Ur: NEGATIVE ng/mL
Propoxyphene Scrn, Ur: NEGATIVE ng/mL

## 2021-03-02 LAB — CBC/D/PLT+RPR+RH+ABO+RUBIGG...
Antibody Screen: NEGATIVE
HCV Ab: <0.1 s/co ratio (ref 0.0–0.9)
HIV Screen 4th Generation wRfx: NONREACTIVE
Hepatitis B Surface Ag: NEGATIVE
RPR Ser Ql: NONREACTIVE
Rh Factor: POSITIVE
Rubella Antibodies, IGG: 1.33 index (ref >0.99)

## 2021-03-02 LAB — INTEGRATED 1
Crown Rump Length: 74.8 mm
Gest. Age on Collection Date: 13.3 weeks
Maternal Age at EDD: 35 yr
Nuchal Translucency (NT): 1.5 mm
Number of Fetuses: 1
PAPP-A Value: 1743.3 ng/mL
Weight: 104 [lb_av]

## 2021-03-02 LAB — HCV INTERPRETATION

## 2021-03-08 ENCOUNTER — Other Ambulatory Visit: Payer: Self-pay | Admitting: Obstetrics & Gynecology

## 2021-03-10 ENCOUNTER — Encounter: Payer: Self-pay | Admitting: Women's Health

## 2021-03-10 DIAGNOSIS — R87619 Unspecified abnormal cytological findings in specimens from cervix uteri: Secondary | ICD-10-CM | POA: Insufficient documentation

## 2021-03-10 LAB — CYTOLOGY - PAP
Chlamydia: NEGATIVE
Comment: NEGATIVE
Comment: NEGATIVE
Comment: NEGATIVE
Comment: NORMAL
Diagnosis: HIGH — AB
HPV 16: POSITIVE — AB
HPV 18 / 45: NEGATIVE
High risk HPV: POSITIVE — AB
Neisseria Gonorrhea: NEGATIVE

## 2021-04-04 ENCOUNTER — Other Ambulatory Visit: Payer: Self-pay | Admitting: Women's Health

## 2021-04-04 ENCOUNTER — Encounter: Payer: Self-pay | Admitting: Women's Health

## 2021-04-04 ENCOUNTER — Other Ambulatory Visit: Payer: Self-pay

## 2021-04-04 ENCOUNTER — Ambulatory Visit (INDEPENDENT_AMBULATORY_CARE_PROVIDER_SITE_OTHER): Payer: Medicaid Other | Admitting: Women's Health

## 2021-04-04 ENCOUNTER — Ambulatory Visit (INDEPENDENT_AMBULATORY_CARE_PROVIDER_SITE_OTHER): Payer: Medicaid Other

## 2021-04-04 VITALS — BP 103/66 | HR 91 | Wt 112.0 lb

## 2021-04-04 DIAGNOSIS — O4420 Partial placenta previa NOS or without hemorrhage, unspecified trimester: Secondary | ICD-10-CM

## 2021-04-04 DIAGNOSIS — Z3481 Encounter for supervision of other normal pregnancy, first trimester: Secondary | ICD-10-CM

## 2021-04-04 DIAGNOSIS — R87613 High grade squamous intraepithelial lesion on cytologic smear of cervix (HGSIL): Secondary | ICD-10-CM

## 2021-04-04 DIAGNOSIS — Z3A18 18 weeks gestation of pregnancy: Secondary | ICD-10-CM

## 2021-04-04 DIAGNOSIS — Z3482 Encounter for supervision of other normal pregnancy, second trimester: Secondary | ICD-10-CM

## 2021-04-04 DIAGNOSIS — Z3480 Encounter for supervision of other normal pregnancy, unspecified trimester: Secondary | ICD-10-CM

## 2021-04-04 DIAGNOSIS — Z363 Encounter for antenatal screening for malformations: Secondary | ICD-10-CM | POA: Diagnosis not present

## 2021-04-04 MED ORDER — PROMETHAZINE HCL 25 MG PO TABS: ORAL_TABLET | ORAL | 2 refills | Status: DC

## 2021-04-04 NOTE — Patient Instructions (Addendum)
Roberta Baker, thank you for choosing our office today! We appreciate the opportunity to meet your healthcare needs. You may receive a short survey by mail, e-mail, or through [employer]. If you are happy with your care we would appreciate if you could take just a few minutes to complete the survey questions. We read all of your comments and take your feedback very seriously. Thank you again for choosing our office.  Center for [employer] [facility] Team at [facility] at [facility] ([address] [place], [place] [address]) Entrance C, located off of [address] valet parking   You can try a 7 night over the counter yeast cream such as Monistat 7. It does not need to be brand name, generic is fine, but please make sure it is a 7 night cream.    Go to [email or web address] to register for FREE online childbirth classes  Call the office [phone]) or go to [facility] if: You begin to severe cramping Your water breaks.  Sometimes it is a big gush of fluid, sometimes it is just a trickle that keeps getting your panties wet or running down your legs You have vaginal bleeding.  It is normal to have a small amount of spotting if your cervix was checked.   [facility] Pediatricians/Family Doctors [facility]): [address] Colette Ribas, [phone]           [facility]: [address], [phone]                [facility]): [address], [phone] (call to ask if accepting patients) [facility] Department: [address], [place], [phone]    [facility] Pediatricians/Family Doctors [facility]): [phone] [address], [phone] [facility]: [address] [place], [phone] [name] [place]: [address], [phone]  [facility] Doctors  [facility]): [phone] [facility]: [address], [phone]   [facility] Doctors [facility]: [address] [phone]  [facility] Doctors  [facility]: [phone], [phone]  Home Blood Pressure Monitoring for Patients   Your provider has recommended that you check your blood pressure (BP) at least once a week at home. If you do not have a blood pressure cuff at home, one will be provided for you. Contact your provider if you have not received your monitor within 1 week.   Helpful Tips for Accurate Home Blood Pressure Checks  Don't smoke, exercise, or drink caffeine 30 minutes before checking your BP Use the restroom before checking your BP (a full bladder can raise your pressure) Relax in a comfortable upright chair Feet on the ground Left arm resting comfortably on a flat surface at the level of your heart Legs uncrossed Back supported Sit quietly and don't talk Place the cuff on your bare arm Adjust snuggly, so that only two fingertips can fit between your skin and the top of the cuff Check 2 readings separated by at least one minute Keep a log of your BP readings For a visual, please reference this diagram: http://ccnc.care/bpdiagram  Provider Name: [facility]     Phone: [phone]  Zone 1: ALL CLEAR  Continue to monitor your symptoms:  BP reading is less than 140 (top number) or less than 90 (bottom number)  No right upper stomach pain No headaches  or seeing spots No feeling nauseated or throwing up No swelling in face and hands  Zone 2: CAUTION Call your doctor's office for any of the following:  BP reading is greater than 140 (top number) or greater than 90 (bottom number)  Stomach pain under your ribs in the middle or right side Headaches or seeing spots Feeling nauseated or throwing up Swelling in face and hands  Zone 3: EMERGENCY  Seek immediate medical care if you have any of the following:  BP reading is  greater than160 (top number) or greater than 110 (bottom number) Severe headaches not improving with Tylenol Serious difficulty catching your breath Any worsening symptoms from Zone 2     Second Trimester of Pregnancy The second trimester is from week 14 through week 27 (months 4 through 6). The second trimester is often a time when you feel your best. Your body has adjusted to being pregnant, and you begin to feel better physically. Usually, morning sickness has lessened or quit completely, you may have more energy, and you may have an increase in appetite. The second trimester is also a time when the fetus is growing rapidly. At the end of the sixth month, the fetus is about 9 inches long and weighs about 1 pounds. You will likely begin to feel the baby move (quickening) between 16 and 20 weeks of pregnancy. Body changes during your second trimester Your body continues to go through many changes during your second trimester. The changes vary from woman to woman. Your weight will continue to increase. You will notice your lower abdomen bulging out. You may begin to get stretch marks on your hips, abdomen, and breasts. You may develop headaches that can be relieved by medicines. The medicines should be approved by your health care provider. You may urinate more often because the fetus is pressing on your bladder. You may develop or continue to have heartburn as a result of your pregnancy. You may develop constipation because certain hormones are causing the muscles that push waste through your intestines to slow down. You may develop hemorrhoids or swollen, bulging veins (varicose veins). You may have back pain. This is caused by: Weight gain. Pregnancy hormones that are relaxing the joints in your pelvis. A shift in weight and the muscles that support your balance. Your breasts will continue to grow and they will continue to become tender. Your gums may bleed and may be sensitive to brushing  and flossing. Dark spots or blotches (chloasma, mask of pregnancy) may develop on your face. This will likely fade after the baby is born. A dark line from your belly button to the pubic area (linea nigra) may appear. This will likely fade after the baby is born. You may have changes in your hair. These can include thickening of your hair, rapid growth, and changes in texture. Some women also have hair loss during or after pregnancy, or hair that feels dry or thin. Your hair will most likely return to normal after your baby is born.  What to expect at prenatal visits During a routine prenatal visit: You will be weighed to make sure you and the fetus are growing normally. Your blood pressure will be taken. Your abdomen will be measured to track your baby's growth. The fetal heartbeat will be listened to. Any test results from the previous visit will be discussed.  Your health care provider may ask you: How you are feeling. If you are feeling the baby move. If you have had  any abnormal symptoms, such as leaking fluid, bleeding, severe headaches, or abdominal cramping. If you are using any tobacco products, including cigarettes, chewing tobacco, and electronic cigarettes. If you have any questions.  Other tests that may be performed during your second trimester include: Blood tests that check for: Low iron levels (anemia). High blood sugar that affects pregnant women (gestational diabetes) between 21 and 28 weeks. Rh antibodies. This is to check for a protein on red blood cells (Rh factor). Urine tests to check for infections, diabetes, or protein in the urine. An ultrasound to confirm the proper growth and development of the baby. An amniocentesis to check for possible genetic problems. Fetal screens for spina bifida and Down syndrome. HIV (human immunodeficiency virus) testing. Routine prenatal testing includes screening for HIV, unless you choose not to have this test.  Follow these  instructions at home: Medicines Follow your health care provider's instructions regarding medicine use. Specific medicines may be either safe or unsafe to take during pregnancy. Take a prenatal vitamin that contains at least 600 micrograms (mcg) of folic acid. If you develop constipation, try taking a stool softener if your health care provider approves. Eating and drinking Eat a balanced diet that includes fresh fruits and vegetables, whole grains, good sources of protein such as meat, eggs, or tofu, and low-fat dairy. Your health care provider will help you determine the amount of weight gain that is right for you. Avoid raw meat and uncooked cheese. These carry germs that can cause birth defects in the baby. If you have low calcium intake from food, talk to your health care provider about whether you should take a daily calcium supplement. Limit foods that are high in fat and processed sugars, such as fried and sweet foods. To prevent constipation: Drink enough fluid to keep your urine clear or pale yellow. Eat foods that are high in fiber, such as fresh fruits and vegetables, whole grains, and beans. Activity Exercise only as directed by your health care provider. Most women can continue their usual exercise routine during pregnancy. Try to exercise for 30 minutes at least 5 days a week. Stop exercising if you experience uterine contractions. Avoid heavy lifting, wear low heel shoes, and practice good posture. A sexual relationship may be continued unless your health care provider directs you otherwise. Relieving pain and discomfort Wear a good support bra to prevent discomfort from breast tenderness. Take warm sitz baths to soothe any pain or discomfort caused by hemorrhoids. Use hemorrhoid cream if your health care provider approves. Rest with your legs elevated if you have leg cramps or low back pain. If you develop varicose veins, wear support hose. Elevate your feet for 15 minutes, 3-4  times a day. Limit salt in your diet. Prenatal Care Write down your questions. Take them to your prenatal visits. Keep all your prenatal visits as told by your health care provider. This is important. Safety Wear your seat belt at all times when driving. Make a list of emergency phone numbers, including numbers for family, friends, the hospital, and police and fire departments. General instructions Ask your health care provider for a referral to a local prenatal education class. Begin classes no later than the beginning of month 6 of your pregnancy. Ask for help if you have counseling or nutritional needs during pregnancy. Your health care provider can offer advice or refer you to specialists for help with various needs. Do not use hot tubs, steam rooms, or saunas. Do not douche or use  tampons or scented sanitary pads. Do not cross your legs for long periods of time. Avoid cat litter boxes and soil used by cats. These carry germs that can cause birth defects in the baby and possibly loss of the fetus by miscarriage or stillbirth. Avoid all smoking, herbs, alcohol, and unprescribed drugs. Chemicals in these products can affect the formation and growth of the baby. Do not use any products that contain nicotine or tobacco, such as cigarettes and e-cigarettes. If you need help quitting, ask your health care provider. Visit your dentist if you have not gone yet during your pregnancy. Use a soft toothbrush to brush your teeth and be gentle when you floss. Contact a health care provider if: You have dizziness. You have mild pelvic cramps, pelvic pressure, or nagging pain in the abdominal area. You have persistent nausea, vomiting, or diarrhea. You have a bad smelling vaginal discharge. You have pain when you urinate. Get help right away if: You have a fever. You are leaking fluid from your vagina. You have spotting or bleeding from your vagina. You have severe abdominal cramping or pain. You  have rapid weight gain or weight loss. You have shortness of breath with chest pain. You notice sudden or extreme swelling of your face, hands, ankles, feet, or legs. You have not felt your baby move in over an hour. You have severe headaches that do not go away when you take medicine. You have vision changes. Summary The second trimester is from week 14 through week 27 (months 4 through 6). It is also a time when the fetus is growing rapidly. Your body goes through many changes during pregnancy. The changes vary from woman to woman. Avoid all smoking, herbs, alcohol, and unprescribed drugs. These chemicals affect the formation and growth your baby. Do not use any tobacco products, such as cigarettes, chewing tobacco, and e-cigarettes. If you need help quitting, ask your health care provider. Contact your health care provider if you have any questions. Keep all prenatal visits as told by your health care provider. This is important. This information is not intended to replace advice given to you by your health care provider. Make sure you discuss any questions you have with your health care provider. Document Released: 08/14/2001 Document Revised: 01/26/2016 Document Reviewed: 10/21/2012 Elsevier Interactive Patient Education  2017 Elsevier Inc.   https://www.acog.org/Patients/FAQs/Colposcopy">  Colposcopy, Care After This sheet gives you information about how to care for yourself after your procedure. Your health care provider may also give you more specific instructions. If you have problems or questions, contact your health careprovider. What can I expect after the procedure? If you had a colposcopy without a biopsy, you can expect to feel fine right away after your procedure. However, you may have some spotting of blood for afew days. You can return to your normal activities. If you had a colposcopy with a biopsy, it is common after the procedure to have: Soreness and mild pain. These may  last for a few days. Light-headedness. Mild vaginal bleeding or discharge that is dark-colored and grainy. This may last for a few days. The discharge may be caused by a liquid (solution) that was used during the procedure. You may need to wear a sanitary pad during this time. Spotting of blood for at least 48 hours after the procedure. Follow these instructions at home: Medicines Take over-the-counter and prescription medicines only as told by your health care provider. Talk with your health care provider about what type of over-the-counter  pain medicine and prescription medicine you can start to take again. It is especially important to talk with your health care provider if you take blood thinners. Activity Limit your physical activity for the first day after your procedure as told by your health care provider. Avoid using douche products, using tampons, or having sex for at least 3 days after the procedure or for as long as told. Return to your normal activities as told by your health care provider. Ask your health care provider what activities are safe for you. General instructions  Drink enough fluid to keep your urine pale yellow. Ask your health care provider if you may take baths, swim, or use a hot tub. You may take showers. If you use birth control (contraception), continue to use it. Keep all follow-up visits as told by your health care provider. This is important.  Contact a health care provider if: You develop a skin rash. Get help right away if: You bleed a lot from your vagina or pass blood clots. This includes using more than one sanitary pad each hour for 2 hours in a row. You have a fever or chills. You have vaginal discharge that is abnormal, is yellow in color, or smells bad. This could be a sign of infection. You have severe pain or cramps in your lower abdomen that do not go away with medicine. You faint. Summary If you had a colposcopy without a biopsy, you can  expect to feel fine right away, but you may have some spotting of blood for a few days. You can return to your normal activities. If you had a colposcopy with a biopsy, it is common to have mild pain for a few days and spotting for 48 hours after the procedure. Avoid using douche products, using tampons, and having sex for at least 3 days after the procedure or for as long as told by your health care provider. Get help right away if you have heavy bleeding, severe pain, or signs of infection. This information is not intended to replace advice given to you by your health care provider. Make sure you discuss any questions you have with your healthcare provider. Document Revised: 08/19/2019 Document Reviewed: 08/19/2019 Elsevier Patient Education  2022 Elsevier Inc.  Placenta Previa  The placenta is the organ formed during pregnancy. It carries oxygen and nutrients to the unborn baby (fetus). The placenta is the baby's life support system. Placenta previa happens when the placenta implants in the lower part of the uterus. The placenta either partially or completely covers the opening to the cervix. This can cause severebleeding during late pregnancy or delivery. If placenta previa is diagnosed in the first half of pregnancy, the placenta may move into a normal position as the pregnancy progresses. It is important to keep all prenatal visits with your health care provider so that you can beclosely monitored. What are the causes? The cause of this condition is not known. What increases the risk? The following factors may make you more likely to develop this condition: Having scars on the lining of the uterus. Having had previous pregnancies. Having had surgeries involving the uterus, such as a cesarean delivery. Having a history of placenta previa. Having smoked or used cocaine during pregnancy. Being 4 years of age or older during pregnancy. What are the signs or symptoms? The main symptom of  this condition is sudden, painless, bright red vaginal bleeding during the second half of pregnancy. The amount of bleeding can be very light  at first, and it usually stops on its own. Heavier bleeding episodesmay also happen. Some women with placenta previa may have no bleeding at all. How is this diagnosed? This condition may be diagnosed during a routine ultrasound or during a checkup after vaginal bleeding is noticed. In general: If you are diagnosed with placenta previa, digital exams, which use the fingers, will be avoided. Your health care provider will still perform a speculum exam. If you did not have an ultrasound during your pregnancy, placenta previa may not be diagnosed until bleeding occurs during labor. How is this treated? Treatment for this condition depends on: How much you are bleeding, or whether the bleeding has stopped. How far along you are in your pregnancy. The condition of your baby. How much of the placenta is covering the cervix. Treatment may include: Decreased activity. Bed rest at home or in the hospital. Pelvic rest. Nothing is placed inside the vagina during pelvic rest. This means not having sex, not using tampons, and not using douches. A blood transfusion to replace blood that you have lost (maternal blood loss). A cesarean delivery. This may be performed if: The bleeding is heavy and cannot be controlled. The placenta completely covers the cervix. Medicines to stop premature labor or to help the baby's lungs to mature. This treatment may be used if you need to deliver your baby before your pregnancy is full-term. Follow these instructions at home: Get plenty of rest and reduce activity as told by your health care provider. If told by your health care provider, stay on bed rest for the length of time that is recommended. Do not have sex, use tampons, douche, or place anything inside of your vagina if your health care provider recommends pelvic rest. Take  over-the-counter and prescription medicines only as told by your health care provider. Keep all follow-up visits. This is important. Get help right away if: You have vaginal bleeding, even if in small amounts and even if you have no pain. You have cramping or regular contractions. You have pain in your abdomen or your lower back. You have a feeling of increased pressure in your pelvis. You have increased watery or bloody mucus from your vagina. You have not felt your baby moving regularly. Summary Placenta previa is a condition in which the placenta implants in the lower part of the uterus in a pregnant woman. The cause of this condition is not known. The most common symptom of placenta previa is painless, bright red bleeding during pregnancy. It is important to keep all prenatal visits with your health care provider so you can be closely monitored. Get help right away if you have placenta previa and you are experiencing bleeding during pregnancy. This information is not intended to replace advice given to you by your health care provider. Make sure you discuss any questions you have with your healthcare provider. Document Revised: 04/11/2020 Document Reviewed: 04/11/2020 Elsevier Patient Education  2022 [employer].

## 2021-04-04 NOTE — Progress Notes (Signed)
LOW-RISK PREGNANCY VISIT WITH COLPOSCOPY Patient name: Roberta Baker MRN [id number]  Date of birth: 04/21/1986 Chief Complaint:   Routine Prenatal Visit (Abdomin sore)  History of Present Illness:   Roberta Baker is a 35 y.o. G47P1001 female at [redacted]w[redacted]d with an Estimated Date of Delivery: 09/04/21 being seen today for ongoing management of a low-risk pregnancy.   Today she reports  pulled muscle Rt side abdomen pulling box off of shelf at work . Contractions: Not present. Vag. Bleeding: None.  Movement: Present. denies leaking of fluid.  Depression screen [facility] 2/9 02/28/2021 06/07/2017 12/12/2016  Decreased Interest 1 0 0  Down, Depressed, Hopeless 3 0 1  PHQ - 2 Score 4 0 1  Altered sleeping 2 0 1  Tired, decreased energy 3 0 1  Change in appetite 2 0 -  Feeling bad or failure about yourself  3 0 0  Trouble concentrating 1 0 0  Moving slowly or fidgety/restless 0 0 0  Suicidal thoughts 0 0 0  PHQ-9 Score 15 0 3  Difficult doing work/chores - Not difficult at all -     GAD 7 : Generalized Anxiety Score 02/28/2021  Nervous, Anxious, on Edge 3  Control/stop worrying 3  Worry too much - different things 3  Trouble relaxing 3  Restless 2  Easily annoyed or irritable 3  Afraid - awful might happen 3  Total GAD 7 Score 20      Review of Systems:   Pertinent items are noted in HPI Denies abnormal vaginal discharge w/ itching/odor/irritation, headaches, visual changes, shortness of breath, chest pain, abdominal pain, severe nausea/vomiting, or problems with urination or bowel movements unless otherwise stated above. Pertinent History Reviewed:  Reviewed past medical,surgical, social, obstetrical and family history.  Reviewed problem list, medications and allergies. Physical Assessment:   Vitals:   04/04/21 1049  BP: 103/66  Pulse: 91  Weight: 112 lb (50.8 kg)  Body mass index is 21.87 kg/m.        Physical Examination:   General appearance: Well appearing, and in  no distress  Mental status: Alert, oriented to person, place, and time  Skin: Warm & dry  Cardiovascular: Normal heart rate noted  Respiratory: Normal respiratory effort, no distress  Abdomen: Soft, gravid, nontender  Pelvic: Colpo (see note below)        Extremities: Edema: None  Fetal Status: Fetal Heart Rate (bpm): 144 u/s   Movement: Present   [place] 18+1 wks,cephalic,cx 4.2 cm,posterior placenta gr 0,marginal previa,small amount of amnionic sludge,svp of fluid 3.3 cm,fhr 144 bpm,EFW 232 G 53%,anatomy complete  No results found for this or any previous visit (from the past 24 hour(s)).  Assessment & Plan:  1) Low-risk pregnancy G2P1001 at [redacted]w[redacted]d with an Estimated Date of Delivery: 09/04/21   2) Abdominal muslce strain, offered flexeril- declines  3) Low-lying placenta   Meds:  Meds ordered this encounter  Medications   promethazine (PHENERGAN) 25 MG tablet    Sig: TAKE 1 TABLET(25 MG) BY MOUTH EVERY 6 HOURS AS NEEDED FOR NAUSEA OR VOMITING    Dispense:  30 tablet    Refill:  2    Order Specific Question:   Supervising Provider    Answer:   Lazaro Arms [[id number]]   Labs/procedures today: colpo, U/S, and 2nd IT  Plan:  Continue routine obstetrical care  Next visit: prefers in person    Reviewed: Preterm labor symptoms and general obstetric precautions including but not limited to vaginal bleeding,  contractions, leaking of fluid and fetal movement were reviewed in detail with the patient.  All questions were answered. Does have home bp cuff. Office bp cuff given: not applicable. Check bp weekly, let us know if consistently >140 and/or >90.  Follow-up: Return in about 4 weeks (around 05/02/2021) for LROB, CNM, in person.  Future Appointments  Date Time Provider Department Center  05/03/2021  9:50 AM Cheral Marker, CNM [facility]    Orders Placed This Encounter  Procedures   Urine Culture   CBC w/Diff   INTEGRATED 2   Cheral Marker CNM, [facility] 04/04/2021 12:45  PM    COLPOSCOPY PROCEDURE NOTE Patient name: Roberta Baker MRN [id number]  Date of birth: 1986/03/16 Subjective Findings:   Roberta Baker is a 35 y.o. G18P1001 Caucasian female at [redacted]w[redacted]d being seen today for a colposcopy. 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 Contraception: none. Menopausal: no. Hysterectomy: no.   Smoker: yes.  Immunocompromised: yes pregnant .  The risks and benefits were explained and informed consent was obtained, and written copy is in chart. Pertinent History Reviewed:   Reviewed past medical,surgical, social, obstetrical and family history.  Reviewed problem list, medications and allergies. Objective Findings & Procedure:   Vitals:   04/04/21 1049  BP: 103/66  Pulse: 91  Weight: 112 lb (50.8 kg)  Body mass index is 21.87 kg/m.  Time out was performed.  Speculum placed in the vagina, large amt thick curdy yellow d/c c/w yeast, cleared from cervix.  cervix fully visualized. SCJ: fully visualized. Cervix swabbed x 3 with acetic acid.  Acetowhitening present: Yes Cervix:  acetowhite lesion(s) noted at 1 & 5 o'clock, no mosaiscm, punctation, abnormal vessels. No biopsies taken. Vagina: vaginal colposcopy not performed Vulva: vulvar colposcopy not performed  Specimens: 0  Complications: none  Chaperone: Jobe Marker    Colposcopic Impression & Plan:   Colposcopy findings consistent with HSIL Plan: Repeat colposcopy postpartum 8-12wks pp  Return in about 4 weeks (around 05/02/2021) for LROB, CNM, in person.  Cheral Marker CNM, [name] 04/04/2021 12:45 PM

## 2021-04-04 NOTE — Progress Notes (Signed)
[place] 18+1 wks,cephalic,cx 4.2 cm,posterior placenta gr 0,marginal previa,small amount of amnionic sludge,svp of fluid 3.3 cm,fhr 144 bpm,EFW 232 G 53%,anatomy complete

## 2021-04-06 ENCOUNTER — Other Ambulatory Visit: Payer: Self-pay | Admitting: Women's Health

## 2021-04-06 DIAGNOSIS — Z363 Encounter for antenatal screening for malformations: Secondary | ICD-10-CM

## 2021-04-06 DIAGNOSIS — O4420 Partial placenta previa NOS or without hemorrhage, unspecified trimester: Secondary | ICD-10-CM

## 2021-04-06 LAB — URINE CULTURE: Organism ID, Bacteria: NO GROWTH

## 2021-04-06 LAB — INTEGRATED 2
AFP MoM: 0.81
Alpha-Fetoprotein: 44.3 ng/mL
Crown Rump Length: 74.8 mm
DIA MoM: 1.96
DIA Value: 386.1 pg/mL
Estriol, Unconjugated: 1.28 ng/mL
Gest. Age on Collection Date: 13.3 weeks
Gestational Age: 18.3 weeks
Maternal Age at EDD: 35 yr
Nuchal Translucency (NT): 1.5 mm
Nuchal Translucency MoM: 0.87
Number of Fetuses: 1
PAPP-A MoM: 0.86
PAPP-A Value: 1743.3 ng/mL
Test Results:: NEGATIVE
Weight: 104 [lb_av]
Weight: 104 [lb_av]
hCG MoM: 1.41
hCG Value: 43.6 IU/mL
uE3 MoM: 0.74

## 2021-04-06 LAB — CBC WITH DIFFERENTIAL/PLATELET
Basophils Absolute: 0 10*3/uL (ref 0.0–0.2)
Basos: 0 %
EOS (ABSOLUTE): 0.1 10*3/uL (ref 0.0–0.4)
Eos: 0 %
Hematocrit: 35 % (ref 34.0–46.6)
Hemoglobin: 11.4 g/dL (ref 11.1–15.9)
Immature Grans (Abs): 0.1 10*3/uL (ref 0.0–0.1)
Immature Granulocytes: 1 %
Lymphocytes Absolute: 2 10*3/uL (ref 0.7–3.1)
Lymphs: 14 %
MCH: 30.2 pg (ref 26.6–33.0)
MCHC: 32.6 g/dL (ref 31.5–35.7)
MCV: 93 fL (ref 79–97)
Monocytes Absolute: 0.9 10*3/uL (ref 0.1–0.9)
Monocytes: 7 %
Neutrophils Absolute: 10.7 10*3/uL — ABNORMAL HIGH (ref 1.4–7.0)
Neutrophils: 78 %
Platelets: 332 10*3/uL (ref 150–450)
RBC: 3.78 x10E6/uL (ref 3.77–5.28)
RDW: 12.7 % (ref 11.7–15.4)
WBC: 13.8 10*3/uL — ABNORMAL HIGH (ref 3.4–10.8)

## 2021-05-03 ENCOUNTER — Ambulatory Visit (INDEPENDENT_AMBULATORY_CARE_PROVIDER_SITE_OTHER): Payer: Medicaid Other | Admitting: Women's Health

## 2021-05-03 ENCOUNTER — Encounter: Payer: Self-pay | Admitting: Women's Health

## 2021-05-03 ENCOUNTER — Other Ambulatory Visit: Payer: Self-pay

## 2021-05-03 VITALS — BP 105/69 | HR 98 | Wt 117.0 lb

## 2021-05-03 DIAGNOSIS — Z8759 Personal history of other complications of pregnancy, childbirth and the puerperium: Secondary | ICD-10-CM

## 2021-05-03 DIAGNOSIS — Z3480 Encounter for supervision of other normal pregnancy, unspecified trimester: Secondary | ICD-10-CM

## 2021-05-03 DIAGNOSIS — Z3482 Encounter for supervision of other normal pregnancy, second trimester: Secondary | ICD-10-CM

## 2021-05-03 DIAGNOSIS — O4420 Partial placenta previa NOS or without hemorrhage, unspecified trimester: Secondary | ICD-10-CM

## 2021-05-03 NOTE — Progress Notes (Signed)
LOW-RISK PREGNANCY VISIT Patient name: Roberta Baker MRN [id number]  Date of birth: 04/07/1986 Chief Complaint:   Routine Prenatal Visit  History of Present Illness:   Roberta Baker is a 35 y.o. G76P1001 female at [redacted]w[redacted]d with an Estimated Date of Delivery: 09/04/21 being seen today for ongoing management of a low-risk pregnancy.   Today she reports round ligament pain. States she thinks UDS was +for THC b/c she got a Delta Vape and didn't realize it. Not smoking THC. Contractions: Not present. Vag. Bleeding: None.  Movement: Present. denies leaking of fluid.  Depression screen [facility] 2/9 02/28/2021 06/07/2017 12/12/2016  Decreased Interest 1 0 0  Down, Depressed, Hopeless 3 0 1  PHQ - 2 Score 4 0 1  Altered sleeping 2 0 1  Tired, decreased energy 3 0 1  Change in appetite 2 0 -  Feeling bad or failure about yourself  3 0 0  Trouble concentrating 1 0 0  Moving slowly or fidgety/restless 0 0 0  Suicidal thoughts 0 0 0  PHQ-9 Score 15 0 3  Difficult doing work/chores - Not difficult at all -     GAD 7 : Generalized Anxiety Score 02/28/2021  Nervous, Anxious, on Edge 3  Control/stop worrying 3  Worry too much - different things 3  Trouble relaxing 3  Restless 2  Easily annoyed or irritable 3  Afraid - awful might happen 3  Total GAD 7 Score 20      Review of Systems:   Pertinent items are noted in HPI Denies abnormal vaginal discharge w/ itching/odor/irritation, headaches, visual changes, shortness of breath, chest pain, abdominal pain, severe nausea/vomiting, or problems with urination or bowel movements unless otherwise stated above. Pertinent History Reviewed:  Reviewed past medical,surgical, social, obstetrical and family history.  Reviewed problem list, medications and allergies. Physical Assessment:   Vitals:   05/03/21 0928  BP: 105/69  Pulse: 98  Weight: 117 lb (53.1 kg)  Body mass index is 22.85 kg/m.        Physical Examination:   General  appearance: Well appearing, and in no distress  Mental status: Alert, oriented to person, place, and time  Skin: Warm & dry  Cardiovascular: Normal heart rate noted  Respiratory: Normal respiratory effort, no distress  Abdomen: Soft, gravid, nontender  Pelvic: Cervical exam deferred         Extremities: Edema: None  Fetal Status: Fetal Heart Rate (bpm): 145 Fundal Height: 22 cm Movement: Present    Chaperone: N/A   No results found for this or any previous visit (from the past 24 hour(s)).  Assessment & Plan:  1) Low-risk pregnancy G2P1001 at [redacted]w[redacted]d with an Estimated Date of Delivery: 09/04/21   2) RLP, discussed relief measures  3) H/O FGR> EFW q4wks  4) Marginal previa> rpt u/s next visit, continue pelvic rest  5) UDS +THC> at new ob visit, feels it was from a Delta Vape, wants to repeat next visit   Meds: No orders of the defined types were placed in this encounter.  Labs/procedures today: none  Plan:  Continue routine obstetrical care  Next visit: prefers will be in person for pn2     Reviewed: Preterm labor symptoms and general obstetric precautions including but not limited to vaginal bleeding, contractions, leaking of fluid and fetal movement were reviewed in detail with the patient.  All questions were answered. Does have home bp cuff. Office bp cuff given: not applicable. Check bp weekly, let us know if  consistently >140 and/or >90.  Follow-up: Return in about 4 weeks (around 05/31/2021) for LROB, PN2, US:EFW, CNM, in person.  No future appointments.   Orders Placed This Encounter  Procedures   US OB Follow Up   Cheral Marker CNM, [facility] 05/03/2021 9:53 AM

## 2021-05-03 NOTE — Patient Instructions (Signed)
Roberta Baker, thank you for choosing our office today! We appreciate the opportunity to meet your healthcare needs. You may receive a short survey by mail, e-mail, or through [employer]. If you are happy with your care we would appreciate if you could take just a few minutes to complete the survey questions. We read all of your comments and take your feedback very seriously. Thank you again for choosing our office.  Center for [employer] Team at [name]  [facility] at [facility] ([address] [place], [place] [address]) Entrance C, located off of [address] Free 24/7 valet parking   You will have your sugar test next visit.  Please do not eat or drink anything after midnight the night before you come, not even water.  You will be here for at least two hours.  Please make an appointment online for the bloodwork at [email or web address] for 8:00am (or as close to this as possible). Make sure you select the [facility] service center.   CLASSES: Go to [email or web address] to register for classes (childbirth, breastfeeding, waterbirth, infant CPR, daddy bootcamp, etc.)  Call the office [phone]) or go to [facility] if: You begin to have strong, frequent contractions Your water breaks.  Sometimes it is a big gush of fluid, sometimes it is just a trickle that keeps getting your panties wet or running down your legs You have vaginal bleeding.  It is normal to have a small amount of spotting if your cervix was checked.  You don't feel your baby moving like normal.  If you don't, get you something to eat and drink and lay down and focus on feeling your baby move.   If your baby is still not moving like normal, you should call the office or go to [facility].  Call the office [phone]) or go to [facility] hospital for these signs of pre-eclampsia: Severe headache that does not go away with Tylenol Visual changes- seeing spots, double, blurred vision Pain under your right breast or  upper abdomen that does not go away with Tums or heartburn medicine Nausea and/or vomiting Severe swelling in your hands, feet, and face    [facility] Pediatricians/Family Doctors  [name]): [address] [place], [phone]           [facility]: [address], [phone]                [name] Family Medicine [facility]): [address], [phone] (call to ask if accepting patients) [facility] Department: [address], [place], [phone]    [facility] Pediatricians/Family Doctors [facility]): [address], [phone] [facility]: [address] [place], [phone] [facility]: [address], [phone]  [facility] Doctors  [name]): [phone] [facility]: [address] [phone]   [facility] Doctors [facility]: [address] [phone]  [facility] Doctors  [facility]: [phone], [phone]  Home Blood Pressure Monitoring for Patients   Your provider has recommended that you check your blood pressure (BP) at least once a week at home. If you do not have a blood pressure cuff at home, one will be provided for you. Contact your provider if you have not received your monitor within 1 week.   Helpful Tips for Accurate Home Blood Pressure Checks  Don't smoke, exercise, or drink  caffeine 30 minutes before checking your BP Use the restroom before checking your BP (a full bladder can raise your pressure) Relax in a comfortable upright chair Feet on the ground Left arm resting comfortably on a flat surface at the level of your heart Legs uncrossed Back supported Sit quietly and don't talk Place the cuff on your bare arm Adjust snuggly, so that only two fingertips can fit between your skin and the top of the cuff Check 2  readings separated by at least one minute Keep a log of your BP readings For a visual, please reference this diagram: http://ccnc.care/bpdiagram  Provider Name: [facility]     Phone: [phone]  Zone 1: ALL CLEAR  Continue to monitor your symptoms:  BP reading is less than 140 (top number) or less than 90 (bottom number)  No right upper stomach pain No headaches or seeing spots No feeling nauseated or throwing up No swelling in face and hands  Zone 2: CAUTION Call your doctor's office for any of the following:  BP reading is greater than 140 (top number) or greater than 90 (bottom number)  Stomach pain under your ribs in the middle or right side Headaches or seeing spots Feeling nauseated or throwing up Swelling in face and hands  Zone 3: EMERGENCY  Seek immediate medical care if you have any of the following:  BP reading is greater than160 (top number) or greater than 110 (bottom number) Severe headaches not improving with Tylenol Serious difficulty catching your breath Any worsening symptoms from Zone 2   Second Trimester of Pregnancy The second trimester is from week 13 through week 28, months 4 through 6. The second trimester is often a time when you feel your best. Your body has also adjusted to being pregnant, and you begin to feel better physically. Usually, morning sickness has lessened or quit completely, you may have more energy, and you may have an increase in appetite. The second trimester is also a time when the fetus is growing rapidly. At the end of the sixth month, the fetus is about 9 inches long and weighs about 1 pounds. You will likely begin to feel the baby move (quickening) between 18 and 20 weeks of the pregnancy. BODY CHANGES Your body goes through many changes during pregnancy. The changes vary from woman to woman.  Your weight will continue to increase. You will notice your lower abdomen bulging out. You may begin to get stretch marks on your  hips, abdomen, and breasts. You may develop headaches that can be relieved by medicines approved by your health care provider. You may urinate more often because the fetus is pressing on your bladder. You may develop or continue to have heartburn as a result of your pregnancy. You may develop constipation because certain hormones are causing the muscles that push waste through your intestines to slow down. You may develop hemorrhoids or swollen, bulging veins (varicose veins). You may have back pain because of the weight gain and pregnancy hormones relaxing your joints between the bones in your pelvis and as a result of a shift in weight and the muscles that support your balance. Your breasts will continue to grow and be tender. Your gums may bleed and may be sensitive to brushing and flossing. Dark spots or blotches (chloasma, mask of pregnancy) may develop on your face. This will likely fade after the baby is born. A dark line from your belly button to the pubic area (linea nigra) may appear. This  will likely fade after the baby is born. You may have changes in your hair. These can include thickening of your hair, rapid growth, and changes in texture. Some women also have hair loss during or after pregnancy, or hair that feels dry or thin. Your hair will most likely return to normal after your baby is born. WHAT TO EXPECT AT YOUR PRENATAL VISITS During a routine prenatal visit: You will be weighed to make sure you and the fetus are growing normally. Your blood pressure will be taken. Your abdomen will be measured to track your baby's growth. The fetal heartbeat will be listened to. Any test results from the previous visit will be discussed. Your health care provider may ask you: How you are feeling. If you are feeling the baby move. If you have had any abnormal symptoms, such as leaking fluid, bleeding, severe headaches, or abdominal cramping. If you have any questions. Other tests that may  be performed during your second trimester include: Blood tests that check for: Low iron levels (anemia). Gestational diabetes (between 24 and 28 weeks). Rh antibodies. Urine tests to check for infections, diabetes, or protein in the urine. An ultrasound to confirm the proper growth and development of the baby. An amniocentesis to check for possible genetic problems. Fetal screens for spina bifida and Down syndrome. HOME CARE INSTRUCTIONS  Avoid all smoking, herbs, alcohol, and unprescribed drugs. These chemicals affect the formation and growth of the baby. Follow your health care provider's instructions regarding medicine use. There are medicines that are either safe or unsafe to take during pregnancy. Exercise only as directed by your health care provider. Experiencing uterine cramps is a good sign to stop exercising. Continue to eat regular, healthy meals. Wear a good support bra for breast tenderness. Do not use hot tubs, steam rooms, or saunas. Wear your seat belt at all times when driving. Avoid raw meat, uncooked cheese, cat litter boxes, and soil used by cats. These carry germs that can cause birth defects in the baby. Take your prenatal vitamins. Try taking a stool softener (if your health care provider approves) if you develop constipation. Eat more high-fiber foods, such as fresh vegetables or fruit and whole grains. Drink plenty of fluids to keep your urine clear or pale yellow. Take warm sitz baths to soothe any pain or discomfort caused by hemorrhoids. Use hemorrhoid cream if your health care provider approves. If you develop varicose veins, wear support hose. Elevate your feet for 15 minutes, 3-4 times a day. Limit salt in your diet. Avoid heavy lifting, wear low heel shoes, and practice good posture. Rest with your legs elevated if you have leg cramps or low back pain. Visit your dentist if you have not gone yet during your pregnancy. Use a soft toothbrush to brush your teeth  and be gentle when you floss. A sexual relationship may be continued unless your health care provider directs you otherwise. Continue to go to all your prenatal visits as directed by your health care provider. SEEK MEDICAL CARE IF:  You have dizziness. You have mild pelvic cramps, pelvic pressure, or nagging pain in the abdominal area. You have persistent nausea, vomiting, or diarrhea. You have a bad smelling vaginal discharge. You have pain with urination. SEEK IMMEDIATE MEDICAL CARE IF:  You have a fever. You are leaking fluid from your vagina. You have spotting or bleeding from your vagina. You have severe abdominal cramping or pain. You have rapid weight gain or loss. You have shortness of   breath with chest pain. You notice sudden or extreme swelling of your face, hands, ankles, feet, or legs. You have not felt your baby move in over an hour. You have severe headaches that do not go away with medicine. You have vision changes. Document Released: 08/14/2001 Document Revised: 08/25/2013 Document Reviewed: 10/21/2012 [facility] Patient Information 2015 [place], [place]. This information is not intended to replace advice given to you by your health care provider. Make sure you discuss any questions you have with your health care provider.

## 2021-05-10 ENCOUNTER — Ambulatory Visit
Admission: EM | Admit: 2021-05-10 | Discharge: 2021-05-10 | Disposition: A | Discharge status: Home / Self Care | Payer: Medicaid Other | Attending: Family Medicine | Admitting: Family Medicine

## 2021-05-10 ENCOUNTER — Encounter: Payer: Self-pay | Admitting: Emergency Medicine

## 2021-05-10 ENCOUNTER — Other Ambulatory Visit: Payer: Self-pay

## 2021-05-10 DIAGNOSIS — K529 Noninfective gastroenteritis and colitis, unspecified: Secondary | ICD-10-CM

## 2021-05-10 NOTE — Discharge Instructions (Addendum)
Please do your best to ensure adequate fluid intake in order to avoid dehydration. If you find that you are unable to tolerate drinking fluids regularly please proceed to the Emergency Department for evaluation. ° ° °

## 2021-05-10 NOTE — ED Triage Notes (Signed)
Pt vomited several times yesterday, took nausea pill with no relief.  Has not had any further nausea and vomiting since 3am this morning. Needs not for work for yesterday and today. Pt is 5 months preg.

## 2021-05-10 NOTE — ED Provider Notes (Signed)
[name]   [id number] 05/10/21 Arrival Time: 0813  ASSESSMENT & PLAN:  1. Gastroenteritis    No signs of dehydration requiring IVF. Uncomplicated pregnancy. Has phenergan at home should she need.   Discharge Instructions      Please do your best to ensure adequate fluid intake in order to avoid dehydration. If you find that you are unable to tolerate drinking fluids regularly please proceed to the Emergency Department for evaluation.      Reviewed expectations re: course of current medical issues. Questions answered. Outlined signs and symptoms indicating need for more acute intervention. Patient verbalized understanding. After Visit Summary given.   SUBJECTIVE: History from: patient. OB History     Gravida  2   Para  1   Term  1   Preterm      AB  0   Living  1      SAB      IAB      Ectopic      Multiple  0   Live Births             25 weeks.  Roberta Baker is a 35 y.o. female who presents with n/v past 12-18 hours but improving. None within past several hours. No abd pain. No preg complications. Needs a work note.  Patient's last menstrual period was 11/28/2020 (exact date).  Past Surgical History:  Procedure Laterality Date   BREAST ENHANCEMENT SURGERY     BREAST SURGERY     OBJECTIVE:  Vitals:   05/10/21 0832  BP: 98/61  Pulse: (!) 116  Resp: 18  Temp: 98.7 F (37.1 C)  TempSrc: Oral  SpO2: 97%    General appearance: alert; no distress Oropharynx: moist Lungs: clear to auscultation bilaterally; unlabored Heart: tachycardia noted; regular Abdomen: soft; gravid, non-tender Extremities: no edema Skin: warm; dry Neurologic: normal gait Psychological: alert and cooperative; normal mood and affect   Allergies  Allergen Reactions   Sulfa Antibiotics Nausea And Vomiting                                               Past Medical History:  Diagnosis Date   Anxiety    Depression    Headache     Infection    UTI   Kidney infection    Kidney stones    Nausea and vomiting in pregnancy 02/07/2017   Pain, dental 02/07/2017   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   Social History   Socioeconomic History   Marital status: Single    Spouse name: Not on file   Number of children: 1   Years of education: Not on file   Highest education level: Not on file  Occupational History   Not on file  Tobacco Use   Smoking status: Some Days    Packs/day: 0.25    Years: 0.50    Pack years: 0.13    Types: Cigarettes   Smokeless tobacco: Never  Vaping Use   Vaping Use: Never used  Substance and Sexual Activity   Alcohol use: No   Drug use: Not Currently    Types: Marijuana    Comment: occasional   Sexual activity: Yes    Birth control/protection: None  Other Topics Concern   Not on file  Social History Narrative   Not on file  Social Determinants of Health   Financial Resource Strain: Not on file  Food Insecurity: Not on file  Transportation Needs: Not on file  Physical Activity: Not on file  Stress: Not on file  Social Connections: Not on file  Intimate Partner Violence: Not on file   Family History  Problem Relation Age of Onset   Mental illness Mother    Seizures Mother    Cancer Maternal Uncle    Cancer Maternal Grandmother        breast cancer   Heart attack Maternal Glynda Jaeger, MD 05/10/21 [phone]

## 2021-06-02 ENCOUNTER — Ambulatory Visit (INDEPENDENT_AMBULATORY_CARE_PROVIDER_SITE_OTHER): Payer: Medicaid Other | Admitting: Women's Health

## 2021-06-02 ENCOUNTER — Other Ambulatory Visit: Payer: Medicaid Other

## 2021-06-02 ENCOUNTER — Ambulatory Visit (INDEPENDENT_AMBULATORY_CARE_PROVIDER_SITE_OTHER): Payer: Medicaid Other

## 2021-06-02 ENCOUNTER — Other Ambulatory Visit: Payer: Self-pay

## 2021-06-02 VITALS — BP 103/67 | HR 84 | Wt 124.2 lb

## 2021-06-02 DIAGNOSIS — Z3482 Encounter for supervision of other normal pregnancy, second trimester: Secondary | ICD-10-CM

## 2021-06-02 DIAGNOSIS — O4420 Partial placenta previa NOS or without hemorrhage, unspecified trimester: Secondary | ICD-10-CM

## 2021-06-02 DIAGNOSIS — Z3480 Encounter for supervision of other normal pregnancy, unspecified trimester: Secondary | ICD-10-CM

## 2021-06-02 DIAGNOSIS — Z3A26 26 weeks gestation of pregnancy: Secondary | ICD-10-CM

## 2021-06-02 DIAGNOSIS — Z8759 Personal history of other complications of pregnancy, childbirth and the puerperium: Secondary | ICD-10-CM | POA: Diagnosis not present

## 2021-06-02 MED ORDER — TERCONAZOLE 0.4 % VA CREA: 1.0000 | TOPICAL_CREAM | Freq: Every day | VAGINAL | 0 refills | Status: DC

## 2021-06-02 NOTE — Progress Notes (Signed)
[place] 26+4 wks,cephalic,posterior placenta gr 0,tip of placenta to cx 3 cm,cx length 3.3 cm,AFI 12.3 cm,fhr 157 bpm,EFW 960 g 40%

## 2021-06-02 NOTE — Progress Notes (Signed)
LOW-RISK PREGNANCY VISIT Patient name: Roberta Baker MRN [id number]  Date of birth: February 18, 1986 Chief Complaint:   Routine Prenatal Visit ([place] PN2)  History of Present Illness:   Roberta Baker is a 35 y.o. G73P1001 female at [redacted]w[redacted]d with an Estimated Date of Delivery: 09/04/21 being seen today for ongoing management of a low-risk pregnancy.   Today she reports pelvic pressure, feels like when she had foley bulb during induction. Denies abnormal discharge, itching/odor/irritation.   Contractions: Not present. Vag. Bleeding: None.  Movement: Present. denies leaking of fluid.  Depression screen [facility] 2/9 06/02/2021 02/28/2021 06/07/2017 12/12/2016  Decreased Interest 2 1 0 0  Down, Depressed, Hopeless 3 3 0 1  PHQ - 2 Score 5 4 0 1  Altered sleeping 1 2 0 1  Tired, decreased energy 2 3 0 1  Change in appetite 0 2 0 -  Feeling bad or failure about yourself  3 3 0 0  Trouble concentrating 2 1 0 0  Moving slowly or fidgety/restless 0 0 0 0  Suicidal thoughts 0 0 0 0  PHQ-9 Score 13 15 0 3  Difficult doing work/chores - - Not difficult at all -     GAD 7 : Generalized Anxiety Score 02/28/2021  Nervous, Anxious, on Edge 3  Control/stop worrying 3  Worry too much - different things 3  Trouble relaxing 3  Restless 2  Easily annoyed or irritable 3  Afraid - awful might happen 3  Total GAD 7 Score 20      Review of Systems:   Pertinent items are noted in HPI Denies abnormal vaginal discharge w/ itching/odor/irritation, headaches, visual changes, shortness of breath, chest pain, abdominal pain, severe nausea/vomiting, or problems with urination or bowel movements unless otherwise stated above. Pertinent History Reviewed:  Reviewed past medical,surgical, social, obstetrical and family history.  Reviewed problem list, medications and allergies. Physical Assessment:   Vitals:   06/02/21 0848  BP: 103/67  Pulse: 84  Weight: 124 lb 3.2 oz (56.3 kg)  Body mass index is 24.26  kg/m.        Physical Examination:   General appearance: Well appearing, and in no distress  Mental status: Alert, oriented to person, place, and time  Skin: Warm & dry  Cardiovascular: Normal heart rate noted  Respiratory: Normal respiratory effort, no distress  Abdomen: Soft, gravid, nontender  Pelvic:  spec exam: cx long/closed, thick clumpy yeast adherent to vaginal wall          Extremities: Edema: None  Fetal Status: Fetal Heart Rate (bpm): 157 u/s   Movement: Present  [place] 26+4 wks,cephalic,posterior placenta gr 0,tip of placenta to cx 3 cm,cx length 3.3 cm,AFI 12.3 cm,fhr 157 bpm,EFW 960 g 40%  Chaperone: Faith Rogue   No results found for this or any previous visit (from the past 24 hour(s)).  Assessment & Plan:  1) Low-risk pregnancy G2P1001 at [redacted]w[redacted]d with an Estimated Date of Delivery: 09/04/21   2) Vaginal candida, just finished up otc 7 night cream. Rx terazol  3) Resolved marginal previa> now 3cm from cx  4) H/O FGR> 40% today   Meds:  Meds ordered this encounter  Medications   terconazole (TERAZOL 7) 0.4 % vaginal cream    Sig: Place 1 applicator vaginally at bedtime. X 7 nights    Dispense:  45 g    Refill:  0    Order Specific Question:   Supervising Provider    Answer:   Lazaro Arms [  [id number]]   Labs/procedures today: spec exam, U/S, PN2, declined flu shot, and declined tdap  Plan:  Continue routine obstetrical care  Next visit: prefers in person    Reviewed: Preterm labor symptoms and general obstetric precautions including but not limited to vaginal bleeding, contractions, leaking of fluid and fetal movement were reviewed in detail with the patient.  All questions were answered. Does have home bp cuff. Office bp cuff given: not applicable. Check bp weekly, let us know if consistently >140 and/or >90.  Follow-up: Return in about 4 weeks (around 06/30/2021) for LROB, CNM, in person.  Future Appointments  Date Time Provider Department Center  06/30/2021   8:50 AM Cheral Marker, CNM [facility]    Orders Placed This Encounter  Procedures   Pain Management Screening Profile (10S)   Cheral Marker CNM, WHNP-BC 06/02/2021 10:00 AM

## 2021-06-02 NOTE — Patient Instructions (Signed)
Roberta Baker, thank you for choosing our office today! We appreciate the opportunity to meet your healthcare needs. You may receive a short survey by mail, e-mail, or through [employer]. If you are happy with your care we would appreciate if you could take just a few minutes to complete the survey questions. We read all of your comments and take your feedback very seriously. Thank you again for choosing our office.  Center for [employer] Team at [facility]  [facility] at [facility] ([address] [place], [place] [address]) Entrance C, located off of [address] Free 24/7 valet parking   CLASSES: Go to [email or web address] to register for classes (childbirth, breastfeeding, waterbirth, infant CPR, daddy bootcamp, etc.)  Call the office [phone]) or go to [facility] if: You begin to have strong, frequent contractions Your water breaks.  Sometimes it is a big gush of fluid, sometimes it is just a trickle that keeps getting your panties wet or running down your legs You have vaginal bleeding.  It is normal to have a small amount of spotting if your cervix was checked.  You don't feel your baby moving like normal.  If you don't, get you something to eat and drink and lay down and focus on feeling your baby move.   If your baby is still not moving like normal, you should call the office or go to [facility].  Call the office [phone]) or go to [facility] hospital for these signs of pre-eclampsia: Severe headache that does not go away with Tylenol Visual changes- seeing spots, double, blurred vision Pain under your right breast or upper abdomen that does not go away with Tums or heartburn medicine Nausea and/or vomiting Severe swelling in your hands, feet, and face   Tdap Vaccine It is recommended that you get the Tdap vaccine during the third trimester of EACH pregnancy to help protect your baby from getting pertussis (whooping cough) 27-36 weeks is the BEST time to  do this so that you can pass the protection on to your baby. During pregnancy is better than after pregnancy, but if you are unable to get it during pregnancy it will be offered at the hospital.  You can get this vaccine with [place], at the health department, your family doctor, or some local pharmacies Everyone who will be around your baby should also be up-to-date on their vaccines before the baby comes. Adults (who are not pregnant) only need 1 dose of Tdap during adulthood.   [facility] Pediatricians/Family Doctors [facility]): [address] Colette Ribas, [phone]           [facility]: [address], [phone]                [facility] Medicine [facility]): [address], [phone] (call to ask if accepting patients) [facility] Department: [address], [place], [phone]    [facility] Pediatricians/Family Doctors [name]): [phone] [address], [phone] [facility]: [address] [place], [phone] [facility]: [address], [phone]  [facility] Doctors  [facility]): [phone] [facility]: [address] [phone]   [facility] Doctors [facility]: [address] [phone]  [facility] Family Doctors  [facility]: [phone], [phone]  Home Blood Pressure Monitoring for Patients   Your provider has recommended that you check your  blood pressure (BP) at least once a week at home. If you do not have a blood pressure cuff at home, one will be provided for you. Contact your provider if you have not received your monitor within 1 week.   Helpful Tips for Accurate Home Blood Pressure Checks  Don't smoke, exercise, or drink caffeine 30 minutes before checking your BP Use the restroom before checking your BP (a full bladder can raise your  pressure) Relax in a comfortable upright chair Feet on the ground Left arm resting comfortably on a flat surface at the level of your heart Legs uncrossed Back supported Sit quietly and don't talk Place the cuff on your bare arm Adjust snuggly, so that only two fingertips can fit between your skin and the top of the cuff Check 2 readings separated by at least one minute Keep a log of your BP readings For a visual, please reference this diagram: http://ccnc.care/bpdiagram  Provider Name: [facility]     Phone: [phone]  Zone 1: ALL CLEAR  Continue to monitor your symptoms:  BP reading is less than 140 (top number) or less than 90 (bottom number)  No right upper stomach pain No headaches or seeing spots No feeling nauseated or throwing up No swelling in face and hands  Zone 2: CAUTION Call your doctor's office for any of the following:  BP reading is greater than 140 (top number) or greater than 90 (bottom number)  Stomach pain under your ribs in the middle or right side Headaches or seeing spots Feeling nauseated or throwing up Swelling in face and hands  Zone 3: EMERGENCY  Seek immediate medical care if you have any of the following:  BP reading is greater than160 (top number) or greater than 110 (bottom number) Severe headaches not improving with Tylenol Serious difficulty catching your breath Any worsening symptoms from Zone 2   Third Trimester of Pregnancy The third trimester is from week 29 through week 42, months 7 through 9. The third trimester is a time when the fetus is growing rapidly. At the end of the ninth month, the fetus is about 20 inches in length and weighs 6-10 pounds.  BODY CHANGES Your body goes through many changes during pregnancy. The changes vary from woman to woman.  Your weight will continue to increase. You can expect to gain 25-35 pounds (11-16 kg) by the end of the pregnancy. You may begin to get stretch marks on your hips, abdomen,  and breasts. You may urinate more often because the fetus is moving lower into your pelvis and pressing on your bladder. You may develop or continue to have heartburn as a result of your pregnancy. You may develop constipation because certain hormones are causing the muscles that push waste through your intestines to slow down. You may develop hemorrhoids or swollen, bulging veins (varicose veins). You may have pelvic pain because of the weight gain and pregnancy hormones relaxing your joints between the bones in your pelvis. Backaches may result from overexertion of the muscles supporting your posture. You may have changes in your hair. These can include thickening of your hair, rapid growth, and changes in texture. Some women also have hair loss during or after pregnancy, or hair that feels dry or thin. Your hair will most likely return to normal after your baby is born. Your breasts will continue to grow and be tender. A yellow discharge may leak from your breasts called colostrum. Your belly button may stick out. You may   feel short of breath because of your expanding uterus. You may notice the fetus "dropping," or moving lower in your abdomen. You may have a bloody mucus discharge. This usually occurs a few days to a week before labor begins. Your cervix becomes thin and soft (effaced) near your due date. WHAT TO EXPECT AT YOUR PRENATAL EXAMS  You will have prenatal exams every 2 weeks until week 36. Then, you will have weekly prenatal exams. During a routine prenatal visit: You will be weighed to make sure you and the fetus are growing normally. Your blood pressure is taken. Your abdomen will be measured to track your baby's growth. The fetal heartbeat will be listened to. Any test results from the previous visit will be discussed. You may have a cervical check near your due date to see if you have effaced. At around 36 weeks, your caregiver will check your cervix. At the same time, your  caregiver will also perform a test on the secretions of the vaginal tissue. This test is to determine if a type of bacteria, Group B streptococcus, is present. Your caregiver will explain this further. Your caregiver may ask you: What your birth plan is. How you are feeling. If you are feeling the baby move. If you have had any abnormal symptoms, such as leaking fluid, bleeding, severe headaches, or abdominal cramping. If you have any questions. Other tests or screenings that may be performed during your third trimester include: Blood tests that check for low iron levels (anemia). Fetal testing to check the health, activity level, and growth of the fetus. Testing is done if you have certain medical conditions or if there are problems during the pregnancy. FALSE LABOR You may feel small, irregular contractions that eventually go away. These are called Braxton Hicks contractions, or false labor. Contractions may last for hours, days, or even weeks before true labor sets in. If contractions come at regular intervals, intensify, or become painful, it is best to be seen by your caregiver.  SIGNS OF LABOR  Menstrual-like cramps. Contractions that are 5 minutes apart or less. Contractions that start on the top of the uterus and spread down to the lower abdomen and back. A sense of increased pelvic pressure or back pain. A watery or bloody mucus discharge that comes from the vagina. If you have any of these signs before the 37th week of pregnancy, call your caregiver right away. You need to go to the hospital to get checked immediately. HOME CARE INSTRUCTIONS  Avoid all smoking, herbs, alcohol, and unprescribed drugs. These chemicals affect the formation and growth of the baby. Follow your caregiver's instructions regarding medicine use. There are medicines that are either safe or unsafe to take during pregnancy. Exercise only as directed by your caregiver. Experiencing uterine cramps is a good sign to  stop exercising. Continue to eat regular, healthy meals. Wear a good support bra for breast tenderness. Do not use hot tubs, steam rooms, or saunas. Wear your seat belt at all times when driving. Avoid raw meat, uncooked cheese, cat litter boxes, and soil used by cats. These carry germs that can cause birth defects in the baby. Take your prenatal vitamins. Try taking a stool softener (if your caregiver approves) if you develop constipation. Eat more high-fiber foods, such as fresh vegetables or fruit and whole grains. Drink plenty of fluids to keep your urine clear or pale yellow. Take warm sitz baths to soothe any pain or discomfort caused by hemorrhoids. Use hemorrhoid cream if   your caregiver approves. If you develop varicose veins, wear support hose. Elevate your feet for 15 minutes, 3-4 times a day. Limit salt in your diet. Avoid heavy lifting, wear low heal shoes, and practice good posture. Rest a lot with your legs elevated if you have leg cramps or low back pain. Visit your dentist if you have not gone during your pregnancy. Use a soft toothbrush to brush your teeth and be gentle when you floss. A sexual relationship may be continued unless your caregiver directs you otherwise. Do not travel far distances unless it is absolutely necessary and only with the approval of your caregiver. Take prenatal classes to understand, practice, and ask questions about the labor and delivery. Make a trial run to the hospital. Pack your hospital bag. Prepare the baby's nursery. Continue to go to all your prenatal visits as directed by your caregiver. SEEK MEDICAL CARE IF: You are unsure if you are in labor or if your water has broken. You have dizziness. You have mild pelvic cramps, pelvic pressure, or nagging pain in your abdominal area. You have persistent nausea, vomiting, or diarrhea. You have a bad smelling vaginal discharge. You have pain with urination. SEEK IMMEDIATE MEDICAL CARE IF:  You  have a fever. You are leaking fluid from your vagina. You have spotting or bleeding from your vagina. You have severe abdominal cramping or pain. You have rapid weight loss or gain. You have shortness of breath with chest pain. You notice sudden or extreme swelling of your face, hands, ankles, feet, or legs. You have not felt your baby move in over an hour. You have severe headaches that do not go away with medicine. You have vision changes. Document Released: 08/14/2001 Document Revised: 08/25/2013 Document Reviewed: 10/21/2012 ExitCare Patient Information 2015 [name]. This information is not intended to replace advice given to you by your health care provider. Make sure you discuss any questions you have with your health care provider.       

## 2021-06-03 LAB — PMP SCREEN PROFILE (10S), URINE
Amphetamine Scrn, Ur: NEGATIVE ng/mL
BARBITURATE SCREEN URINE: POSITIVE ng/mL — AB
BENZODIAZEPINE SCREEN, URINE: NEGATIVE ng/mL
CANNABINOIDS UR QL SCN: NEGATIVE ng/mL
Cocaine (Metab) Scrn, Ur: NEGATIVE ng/mL
Creatinine(Crt), U: 129 mg/dL (ref 20.0–300.0)
Methadone Screen, Urine: NEGATIVE ng/mL
OXYCODONE+OXYMORPHONE UR QL SCN: NEGATIVE ng/mL
Opiate Scrn, Ur: NEGATIVE ng/mL
Ph of Urine: 5.4 (ref 4.5–8.9)
Phencyclidine Qn, Ur: NEGATIVE ng/mL
Propoxyphene Scrn, Ur: NEGATIVE ng/mL

## 2021-06-03 LAB — GLUCOSE TOLERANCE, 2 HOURS W/ 1HR
Glucose, 1 hour: 107 mg/dL (ref 65–179)
Glucose, 2 hour: 98 mg/dL (ref 65–152)
Glucose, Fasting: 78 mg/dL (ref 65–91)

## 2021-06-03 LAB — CBC
Hematocrit: 30.1 % — ABNORMAL LOW (ref 34.0–46.6)
Hemoglobin: 10 g/dL — ABNORMAL LOW (ref 11.1–15.9)
MCH: 29.8 pg (ref 26.6–33.0)
MCHC: 33.2 g/dL (ref 31.5–35.7)
MCV: 90 fL (ref 79–97)
Platelets: 309 10*3/uL (ref 150–450)
RBC: 3.36 x10E6/uL — ABNORMAL LOW (ref 3.77–5.28)
RDW: 12.1 % (ref 11.7–15.4)
WBC: 13.7 10*3/uL — ABNORMAL HIGH (ref 3.4–10.8)

## 2021-06-03 LAB — ANTIBODY SCREEN: Antibody Screen: NEGATIVE

## 2021-06-03 LAB — MED LIST OPTION NOT SELECTED

## 2021-06-03 LAB — HIV ANTIBODY (ROUTINE TESTING W REFLEX): HIV Screen 4th Generation wRfx: NONREACTIVE

## 2021-06-03 LAB — RPR: RPR Ser Ql: NONREACTIVE

## 2021-06-06 ENCOUNTER — Other Ambulatory Visit: Payer: Self-pay | Admitting: Women's Health

## 2021-06-06 MED ORDER — FERROUS SULFATE 325 (65 FE) MG PO TABS: 325.0000 mg | ORAL_TABLET | ORAL | 2 refills | Status: DC

## 2021-06-07 LAB — URINE CULTURE

## 2021-06-12 ENCOUNTER — Other Ambulatory Visit: Payer: Self-pay | Admitting: Women's Health

## 2021-06-12 ENCOUNTER — Encounter: Payer: Self-pay | Admitting: Women's Health

## 2021-06-12 DIAGNOSIS — O99891 Other specified diseases and conditions complicating pregnancy: Secondary | ICD-10-CM | POA: Insufficient documentation

## 2021-06-12 DIAGNOSIS — R8271 Bacteriuria: Secondary | ICD-10-CM | POA: Insufficient documentation

## 2021-06-12 MED ORDER — NITROFURANTOIN MONOHYD MACRO 100 MG PO CAPS: 100.0000 mg | ORAL_CAPSULE | Freq: Two times a day (BID) | ORAL | 0 refills | Status: DC

## 2021-06-14 ENCOUNTER — Encounter: Payer: Self-pay | Admitting: *Deleted

## 2021-06-30 ENCOUNTER — Encounter: Payer: Self-pay | Admitting: Obstetrics & Gynecology

## 2021-06-30 ENCOUNTER — Encounter: Payer: Medicaid Other | Admitting: Women's Health

## 2021-06-30 ENCOUNTER — Other Ambulatory Visit: Payer: Self-pay

## 2021-06-30 ENCOUNTER — Ambulatory Visit (INDEPENDENT_AMBULATORY_CARE_PROVIDER_SITE_OTHER): Payer: Medicaid Other | Admitting: Obstetrics & Gynecology

## 2021-06-30 VITALS — BP 117/79 | HR 120 | Wt 127.0 lb

## 2021-06-30 DIAGNOSIS — O2340 Unspecified infection of urinary tract in pregnancy, unspecified trimester: Secondary | ICD-10-CM

## 2021-06-30 DIAGNOSIS — Z720 Tobacco use: Secondary | ICD-10-CM

## 2021-06-30 DIAGNOSIS — Z3480 Encounter for supervision of other normal pregnancy, unspecified trimester: Secondary | ICD-10-CM

## 2021-06-30 NOTE — Progress Notes (Signed)
HIGH-RISK PREGNANCY VISIT Patient name: Roberta Baker MRN [id number]  Date of birth: 1986/03/02 Chief Complaint:   Routine Prenatal Visit (+ pressure)  History of Present Illness:   Roberta Baker is a 35 y.o. G31P1001 female at [redacted]w[redacted]d with an Estimated Date of Delivery: 09/04/21 being seen today for ongoing management of a high-risk pregnancy complicated by:  H/o FGR- AGA @ 28wks, []  next growth late 3rd trimester Tobacco use UTI- not yet treated, pt plans to pick up today, []  UCX at next visit  Reviewed +barbituate test- pt states she had taken one of her mom's sleep meds due to difficulty sleeping.  She has not taken any since and is now using Benadryl.  Today she reports  mood changes- feels like she is angry often . FOB is involved, live together, trying to ask for help more often.  Contractions: Not present. Vag. Bleeding: None.  Movement: Present. denies leaking of fluid.   Depression screen [facility] 2/9 06/02/2021 02/28/2021 06/07/2017 12/12/2016  Decreased Interest 2 1 0 0  Down, Depressed, Hopeless 3 3 0 1  PHQ - 2 Score 5 4 0 1  Altered sleeping 1 2 0 1  Tired, decreased energy 2 3 0 1  Change in appetite 0 2 0 -  Feeling bad or failure about yourself  3 3 0 0  Trouble concentrating 2 1 0 0  Moving slowly or fidgety/restless 0 0 0 0  Suicidal thoughts 0 0 0 0  PHQ-9 Score 13 15 0 3  Difficult doing work/chores - - Not difficult at all -     Current Outpatient Medications  Medication Instructions   Blood Pressure Monitor MISC For regular home bp monitoring during pregnancy   DICLEGIS 10-10 MG TBEC 2 tablets, Oral, 2 times daily   diphenhydrAMINE HCl (BENADRYL PO) Oral   ferrous sulfate 325 mg, Oral, Every other day   hydrOXYzine (VISTARIL) 25 mg, Oral, 2 times daily PRN   nitrofurantoin (macrocrystal-monohydrate) (MACROBID) 100 mg, Oral, 2 times daily, X 7 days   promethazine (PHENERGAN) 25 MG tablet TAKE 1 TABLET(25 MG) BY MOUTH EVERY 6 HOURS AS NEEDED FOR  NAUSEA OR VOMITING   terconazole (TERAZOL 7) 0.4 % vaginal cream 1 applicator, Vaginal, Daily at bedtime, X 7 nights     Review of Systems:   Pertinent items are noted in HPI Denies abnormal vaginal discharge w/ itching/odor/irritation, headaches, visual changes, shortness of breath, chest pain, abdominal pain, severe nausea/vomiting, or problems with urination or bowel movements unless otherwise stated above. Pertinent History Reviewed:  Reviewed past medical,surgical, social, obstetrical and family history.  Reviewed problem list, medications and allergies. Physical Assessment:   Vitals:   06/30/21 0859  BP: 117/79  Pulse: (!) 120  Weight: 127 lb (57.6 kg)  Body mass index is 24.8 kg/m.           Physical Examination:   General appearance: alert, well appearing, and in no distress  Mental status: alert, oriented to person, place, and time  Skin: warm & dry   Extremities: Edema: None    Cardiovascular: normal heart rate noted  Respiratory: normal respiratory effort, no distress  Abdomen: gravid, soft, non-tender  Pelvic: Cervical exam deferred         Fetal Status: Fetal Heart Rate (bpm): 135 Fundal Height: 29 cm Movement: Present    Fetal Surveillance Testing today: doppler   Chaperone: N/A    No results found for this or any previous visit (from the past 24 hour(s)).  Assessment & Plan:  High-risk pregnancy: G2P1001 at [redacted]w[redacted]d with an Estimated Date of Delivery: 09/04/21   1) h/o FGR- f/u growth AGA, repeat in 3rd trimester  2) Tobacco use- trying to cut down, but struggles  3) UTI, []  TOC next visit- confirm that pt was able to obtain meds at next visit  4) Vaginitis- Terconazol was not covered- encouraged pt to try Good Rx.  Monistat did not work.  []  pt to call if still unable to pick up medication  5) Iron def. Anemia- non-compliance, encouraged trial of at least every 2-3 days, []  repeat @ 36wks  Meds: No orders of the defined types were placed in this  encounter.  Labs/procedures today: doppler  Treatment Plan:  plan as outlined above  Reviewed: Preterm labor symptoms and general obstetric precautions including but not limited to vaginal bleeding, contractions, leaking of fluid and fetal movement were reviewed in detail with the patient.  All questions were answered. Pt has home bp cuff. Check bp weekly, let know if >140/90.   Follow-up: Return in about 2 weeks (around 07/14/2021) for HROB visit.   Future Appointments  Date Time Provider Department Center  07/17/2021  8:50 AM [place], MD [facility]    No orders of the defined types were placed in this encounter.   13/07/2021, DO Attending Obstetrician & Gynecologist, [facility] for Lazaro Arms, [facility]

## 2021-07-10 ENCOUNTER — Encounter: Payer: Medicaid Other | Admitting: Women's Health

## 2021-07-11 ENCOUNTER — Encounter: Payer: Self-pay | Admitting: *Deleted

## 2021-07-12 ENCOUNTER — Other Ambulatory Visit: Payer: Self-pay | Admitting: Women's Health

## 2021-07-12 ENCOUNTER — Encounter: Payer: Self-pay | Admitting: Women's Health

## 2021-07-12 ENCOUNTER — Other Ambulatory Visit: Payer: Self-pay

## 2021-07-12 ENCOUNTER — Ambulatory Visit (INDEPENDENT_AMBULATORY_CARE_PROVIDER_SITE_OTHER): Payer: Medicaid Other | Admitting: Women's Health

## 2021-07-12 VITALS — BP 105/64 | HR 109 | Wt 127.6 lb

## 2021-07-12 DIAGNOSIS — Z3483 Encounter for supervision of other normal pregnancy, third trimester: Secondary | ICD-10-CM

## 2021-07-12 MED ORDER — PROMETHAZINE HCL 25 MG PO TABS: ORAL_TABLET | ORAL | 2 refills | Status: DC

## 2021-07-12 MED ORDER — DOXYLAMINE-PYRIDOXINE 10-10 MG PO TBEC: DELAYED_RELEASE_TABLET | ORAL | 6 refills | Status: DC

## 2021-07-12 NOTE — Progress Notes (Signed)
LOW-RISK PREGNANCY VISIT Patient name: Roberta Baker MRN [id number]  Date of birth: 03-01-1986 Chief Complaint:   Routine Prenatal Visit  History of Present Illness:   Roberta Baker is a 35 y.o. G44P1001 female at [redacted]w[redacted]d with an Estimated Date of Delivery: 09/04/21 being seen today for ongoing management of a low-risk pregnancy.   Today she reports  pressure, tried taking uti meds x 3d , most came back up-peed but didn't collect in cup today, has generic diclegis- makes her sick, brand name did fine. Needs refill on phenergan . Numbness bilateral calves in the morning when getting out of bed. Dep/anx- not taking vistaril, declines meds/therapy right now but worried about pp period. Discussed getting in to therapy now would be beneficial-declines. Trouble sleeping- took 2 of her mom's trazadones. Bumps underneath bilateral breasts.  Contractions: Irritability. Vag. Bleeding: None.  Movement: Present. denies leaking of fluid.  Depression screen [facility] 2/9 06/02/2021 02/28/2021 06/07/2017 12/12/2016  Decreased Interest 2 1 0 0  Down, Depressed, Hopeless 3 3 0 1  PHQ - 2 Score 5 4 0 1  Altered sleeping 1 2 0 1  Tired, decreased energy 2 3 0 1  Change in appetite 0 2 0 -  Feeling bad or failure about yourself  3 3 0 0  Trouble concentrating 2 1 0 0  Moving slowly or fidgety/restless 0 0 0 0  Suicidal thoughts 0 0 0 0  PHQ-9 Score 13 15 0 3  Difficult doing work/chores - - Not difficult at all -     GAD 7 : Generalized Anxiety Score 02/28/2021  Nervous, Anxious, on Edge 3  Control/stop worrying 3  Worry too much - different things 3  Trouble relaxing 3  Restless 2  Easily annoyed or irritable 3  Afraid - awful might happen 3  Total GAD 7 Score 20      Review of Systems:   Pertinent items are noted in HPI Denies abnormal vaginal discharge w/ itching/odor/irritation, headaches, visual changes, shortness of breath, chest pain, abdominal pain, severe nausea/vomiting, or problems  with urination or bowel movements unless otherwise stated above. Pertinent History Reviewed:  Reviewed past medical,surgical, social, obstetrical and family history.  Reviewed problem list, medications and allergies. Physical Assessment:   Vitals:   07/12/21 1353  BP: 105/64  Pulse: (!) 109  Weight: 127 lb 9.6 oz (57.9 kg)  Body mass index is 24.92 kg/m.        Physical Examination:   General appearance: Well appearing, and in no distress  Mental status: Alert, oriented to person, place, and time  Skin: Warm & dry, heat rash under bilateral breasts  Cardiovascular: Normal heart rate noted  Respiratory: Normal respiratory effort, no distress  Abdomen: Soft, gravid, nontender  Pelvic: Cervical exam deferred         Extremities: Edema: None  Fetal Status: Fetal Heart Rate (bpm): 143 Fundal Height: 31 cm Movement: Present    Chaperone: N/A   No results found for this or any previous visit (from the past 24 hour(s)).  Assessment & Plan:  1) Low-risk pregnancy G2P1001 at [redacted]w[redacted]d with an Estimated Date of Delivery: 09/04/21   2) H/O FGR, last EFW 40% @ 26wks  3) Dep/anx> declines meds/therapy  4) N/V> refilled phenergan  5) Recent UTI> tried meds x 3d, most came back up, no sx, peed w/o collecting today, will try next visit for repeat urine cx   Meds:  Meds ordered this encounter  Medications  promethazine (PHENERGAN) 25 MG tablet    Sig: TAKE 1 TABLET(25 MG) BY MOUTH EVERY 6 HOURS AS NEEDED FOR NAUSEA OR VOMITING    Dispense:  30 tablet    Refill:  2    Order Specific Question:   Supervising Provider    Answer:   Lazaro Arms [[id number]]    Labs/procedures today: none  Plan:  Continue routine obstetrical care  Next visit: prefers in person    Reviewed: Preterm labor symptoms and general obstetric precautions including but not limited to vaginal bleeding, contractions, leaking of fluid and fetal movement were reviewed in detail with the patient.  All questions were answered.  Does have home bp cuff. Office bp cuff given: not applicable. Check bp weekly, let us know if consistently >140 and/or >90.  Follow-up: Return in about 2 weeks (around 07/26/2021) for LROB, CNM, in person.  Future Appointments  Date Time Provider Department Center  07/26/2021  9:30 AM Arabella Merles, CNM [facility]    No orders of the defined types were placed in this encounter.  Cheral Marker CNM, [facility] 07/12/2021 2:45 PM

## 2021-07-12 NOTE — Patient Instructions (Addendum)
Roberta Baker, thank you for choosing our office today! We appreciate the opportunity to meet your healthcare needs. You may receive a short survey by mail, e-mail, or through [employer]. If you are happy with your care we would appreciate if you could take just a few minutes to complete the survey questions. We read all of your comments and take your feedback very seriously. Thank you again for choosing our office.  Center for [employer] Healthcare Team at [facility]  [facility] at [name] ([address] [place], [place] [address]) Entrance C, located off of [address] 24/7 valet parking   Tips to Help You Sleep Better:  Get into a bedtime routine, try to do the same thing every night before going to bed to try to help your body wind down Warm baths Avoid caffeine for at least 3 hours before going to sleep  Keep your room at a slightly cooler temperature, can try running a fan Turn off TV, lights, phone, electronics Lots of pillows if needed to help you get comfortable Lavender scented items can help you sleep. You can place lavender essential oil on a cotton ball and place under your pillowcase, or place in a diffuser. [employer] has a lavender scented sleep line (plug-ins, sprays, etc). Look in the pillow aisle for lavender scented pillows.  If none of the above things help, you can try 1/2 to 1 tablet of benadryl, unisom, or tylenol pm. Do not take this every night, only when you really need it.    CLASSES: Go to [email or web address] to register for classes (childbirth, breastfeeding, waterbirth, infant CPR, daddy bootcamp, etc.)  Call the office [phone]) or go to [facility] if: You begin to have strong, frequent contractions Your water breaks.  Sometimes it is a big gush of fluid, sometimes it is just a trickle that keeps getting your panties wet or running down your legs You have vaginal bleeding.  It is normal to have a small amount of spotting if your cervix was  checked.  You don't feel your baby moving like normal.  If you don't, get you something to eat and drink and lay down and focus on feeling your baby move.   If your baby is still not moving like normal, you should call the office or go to [facility].  Call the office [phone]) or go to [name] hospital for these signs of pre-eclampsia: Severe headache that does not go away with Tylenol Visual changes- seeing spots, double, blurred vision Pain under your right breast or upper abdomen that does not go away with Tums or heartburn medicine Nausea and/or vomiting Severe swelling in your hands, feet, and face   Tdap Vaccine It is recommended that you get the Tdap vaccine during the third trimester of EACH pregnancy to help protect your baby from getting pertussis (whooping cough) 27-36 weeks is the BEST time to do this so that you can pass the protection on to your baby. During pregnancy is better than after pregnancy, but if you are unable to get it during pregnancy it will be offered at the hospital.  You can get this vaccine with [place], at the health department, your family doctor, or some local pharmacies Everyone who will be around your baby should also be up-to-date on their vaccines before the baby comes. Adults (who are not pregnant) only need 1 dose of Tdap during adulthood.   [facility] Pediatricians/Family Doctors [facility]): [address], [phone]  [facility]: [address], [phone]                [facility] Medicine [facility]): [address], [phone] (call to ask if accepting patients) [facility] Department: [address], [place], [phone]    [facility] Pediatricians/Family Doctors [facility]): [phone], [phone] [facility]: [address] [place], [phone] [facility]: [address],  [phone]  [name] Doctors  [facility]): [phone] [facility]: [address] [phone]   [facility] Doctors [name]: [address] [phone]  [facility] Doctors  [facility]: [phone], [phone]  Home Blood Pressure Monitoring for Patients   Your provider has recommended that you check your blood pressure (BP) at least once a week at home. If you do not have a blood pressure cuff at home, one will be provided for you. Contact your provider if you have not received your monitor within 1 week.   Helpful Tips for Accurate Home Blood Pressure Checks  Don't smoke, exercise, or drink caffeine 30 minutes before checking your BP Use the restroom before checking your BP (a full bladder can raise your pressure) Relax in a comfortable upright chair Feet on the ground Left arm resting comfortably on a flat surface at the level of your heart Legs uncrossed Back supported Sit quietly and don't talk Place the cuff on your bare arm Adjust snuggly, so that only two fingertips can fit between your skin and the top of the cuff Check 2 readings separated by at least one minute Keep a log of your BP readings For a visual, please reference this diagram: http://ccnc.care/bpdiagram  Provider Name: [facility]     Phone: [phone]  Zone 1: ALL CLEAR  Continue to monitor your symptoms:  BP reading is less than 140 (top number) or less than 90 (bottom number)  No right upper stomach pain No headaches or seeing spots No feeling nauseated or throwing up No swelling in face and hands  Zone 2: CAUTION Call your doctor's office for any of the following:  BP reading is greater than 140 (top number) or greater than 90 (bottom number)  Stomach pain under your ribs in the middle or right side Headaches or seeing spots Feeling nauseated or throwing up Swelling in face  and hands  Zone 3: EMERGENCY  Seek immediate medical care if you have any of the following:  BP reading is greater than160 (top number) or greater than 110 (bottom number) Severe headaches not improving with Tylenol Serious difficulty catching your breath Any worsening symptoms from Zone 2   Third Trimester of Pregnancy The third trimester is from week 29 through week 42, months 7 through 9. The third trimester is a time when the fetus is growing rapidly. At the end of the ninth month, the fetus is about 20 inches in length and weighs 6-10 pounds.  BODY CHANGES Your body goes through many changes during pregnancy. The changes vary from woman to woman.  Your weight will continue to increase. You can expect to gain 25-35 pounds (11-16 kg) by the end of the pregnancy. You may begin to get stretch marks on your hips, abdomen, and breasts. You may urinate more often because the fetus is moving lower into your pelvis and pressing on your bladder. You may develop or continue to  have heartburn as a result of your pregnancy. You may develop constipation because certain hormones are causing the muscles that push waste through your intestines to slow down. You may develop hemorrhoids or swollen, bulging veins (varicose veins). You may have pelvic pain because of the weight gain and pregnancy hormones relaxing your joints between the bones in your pelvis. Backaches may result from overexertion of the muscles supporting your posture. You may have changes in your hair. These can include thickening of your hair, rapid growth, and changes in texture. Some women also have hair loss during or after pregnancy, or hair that feels dry or thin. Your hair will most likely return to normal after your baby is born. Your breasts will continue to grow and be tender. A yellow discharge may leak from your breasts called colostrum. Your belly button may stick out. You may feel short of breath because of your expanding  uterus. You may notice the fetus "dropping," or moving lower in your abdomen. You may have a bloody mucus discharge. This usually occurs a few days to a week before labor begins. Your cervix becomes thin and soft (effaced) near your due date. WHAT TO EXPECT AT YOUR PRENATAL EXAMS  You will have prenatal exams every 2 weeks until week 36. Then, you will have weekly prenatal exams. During a routine prenatal visit: You will be weighed to make sure you and the fetus are growing normally. Your blood pressure is taken. Your abdomen will be measured to track your baby's growth. The fetal heartbeat will be listened to. Any test results from the previous visit will be discussed. You may have a cervical check near your due date to see if you have effaced. At around 36 weeks, your caregiver will check your cervix. At the same time, your caregiver will also perform a test on the secretions of the vaginal tissue. This test is to determine if a type of bacteria, Group B streptococcus, is present. Your caregiver will explain this further. Your caregiver may ask you: What your birth plan is. How you are feeling. If you are feeling the baby move. If you have had any abnormal symptoms, such as leaking fluid, bleeding, severe headaches, or abdominal cramping. If you have any questions. Other tests or screenings that may be performed during your third trimester include: Blood tests that check for low iron levels (anemia). Fetal testing to check the health, activity level, and growth of the fetus. Testing is done if you have certain medical conditions or if there are problems during the pregnancy. FALSE LABOR You may feel small, irregular contractions that eventually go away. These are called Braxton Hicks contractions, or false labor. Contractions may last for hours, days, or even weeks before true labor sets in. If contractions come at regular intervals, intensify, or become painful, it is best to be seen by  your caregiver.  SIGNS OF LABOR  Menstrual-like cramps. Contractions that are 5 minutes apart or less. Contractions that start on the top of the uterus and spread down to the lower abdomen and back. A sense of increased pelvic pressure or back pain. A watery or bloody mucus discharge that comes from the vagina. If you have any of these signs before the 37th week of pregnancy, call your caregiver right away. You need to go to the hospital to get checked immediately. HOME CARE INSTRUCTIONS  Avoid all smoking, herbs, alcohol, and unprescribed drugs. These chemicals affect the formation and growth of the baby. Follow your caregiver's instructions  regarding medicine use. There are medicines that are either safe or unsafe to take during pregnancy. Exercise only as directed by your caregiver. Experiencing uterine cramps is a good sign to stop exercising. Continue to eat regular, healthy meals. Wear a good support bra for breast tenderness. Do not use hot tubs, steam rooms, or saunas. Wear your seat belt at all times when driving. Avoid raw meat, uncooked cheese, cat litter boxes, and soil used by cats. These carry germs that can cause birth defects in the baby. Take your prenatal vitamins. Try taking a stool softener (if your caregiver approves) if you develop constipation. Eat more high-fiber foods, such as fresh vegetables or fruit and whole grains. Drink plenty of fluids to keep your urine clear or pale yellow. Take warm sitz baths to soothe any pain or discomfort caused by hemorrhoids. Use hemorrhoid cream if your caregiver approves. If you develop varicose veins, wear support hose. Elevate your feet for 15 minutes, 3-4 times a day. Limit salt in your diet. Avoid heavy lifting, wear low heal shoes, and practice good posture. Rest a lot with your legs elevated if you have leg cramps or low back pain. Visit your dentist if you have not gone during your pregnancy. Use a soft toothbrush to brush your  teeth and be gentle when you floss. A sexual relationship may be continued unless your caregiver directs you otherwise. Do not travel far distances unless it is absolutely necessary and only with the approval of your caregiver. Take prenatal classes to understand, practice, and ask questions about the labor and delivery. Make a trial run to the hospital. Pack your hospital bag. Prepare the baby's nursery. Continue to go to all your prenatal visits as directed by your caregiver. SEEK MEDICAL CARE IF: You are unsure if you are in labor or if your water has broken. You have dizziness. You have mild pelvic cramps, pelvic pressure, or nagging pain in your abdominal area. You have persistent nausea, vomiting, or diarrhea. You have a bad smelling vaginal discharge. You have pain with urination. SEEK IMMEDIATE MEDICAL CARE IF:  You have a fever. You are leaking fluid from your vagina. You have spotting or bleeding from your vagina. You have severe abdominal cramping or pain. You have rapid weight loss or gain. You have shortness of breath with chest pain. You notice sudden or extreme swelling of your face, hands, ankles, feet, or legs. You have not felt your baby move in over an hour. You have severe headaches that do not go away with medicine. You have vision changes. Document Released: 08/14/2001 Document Revised: 08/25/2013 Document Reviewed: 10/21/2012 [facility] Patient Information 2015 [place], [place]. This information is not intended to replace advice given to you by your health care provider. Make sure you discuss any questions you have with your health care provider.

## 2021-07-17 ENCOUNTER — Encounter: Payer: Medicaid Other | Admitting: Obstetrics & Gynecology

## 2021-07-26 ENCOUNTER — Encounter: Payer: Medicaid Other | Admitting: Advanced Practice Midwife

## 2021-07-30 ENCOUNTER — Other Ambulatory Visit: Payer: Self-pay

## 2021-07-30 ENCOUNTER — Encounter (HOSPITAL_COMMUNITY): Payer: Self-pay | Admitting: Obstetrics and Gynecology

## 2021-07-30 ENCOUNTER — Inpatient Hospital Stay (HOSPITAL_COMMUNITY)
Admission: AD | Admit: 2021-07-30 | Discharge: 2021-07-30 | Disposition: A | Discharge status: Home / Self Care | Payer: Medicaid Other | Attending: Obstetrics and Gynecology | Admitting: Obstetrics and Gynecology

## 2021-07-30 DIAGNOSIS — R102 Pelvic and perineal pain: Secondary | ICD-10-CM | POA: Insufficient documentation

## 2021-07-30 DIAGNOSIS — O26893 Other specified pregnancy related conditions, third trimester: Secondary | ICD-10-CM | POA: Insufficient documentation

## 2021-07-30 DIAGNOSIS — O26899 Other specified pregnancy related conditions, unspecified trimester: Secondary | ICD-10-CM

## 2021-07-30 DIAGNOSIS — Z3A34 34 weeks gestation of pregnancy: Secondary | ICD-10-CM | POA: Insufficient documentation

## 2021-07-30 DIAGNOSIS — O98813 Other maternal infectious and parasitic diseases complicating pregnancy, third trimester: Secondary | ICD-10-CM | POA: Insufficient documentation

## 2021-07-30 DIAGNOSIS — B3731 Acute candidiasis of vulva and vagina: Secondary | ICD-10-CM | POA: Insufficient documentation

## 2021-07-30 DIAGNOSIS — O98819 Other maternal infectious and parasitic diseases complicating pregnancy, unspecified trimester: Secondary | ICD-10-CM

## 2021-07-30 LAB — URINALYSIS, ROUTINE W REFLEX MICROSCOPIC
Bacteria, UA: NONE SEEN
Bilirubin Urine: NEGATIVE
Glucose, UA: NEGATIVE mg/dL
Ketones, ur: NEGATIVE mg/dL
Nitrite: NEGATIVE
Protein, ur: NEGATIVE mg/dL
Specific Gravity, Urine: 1.005 (ref 1.005–1.030)
pH: 7 (ref 5.0–8.0)

## 2021-07-30 MED ORDER — FLUCONAZOLE 150 MG PO TABS: 150.0000 mg | ORAL_TABLET | Freq: Every day | ORAL | 0 refills | Status: DC

## 2021-07-30 MED ORDER — LACTATED RINGERS IV BOLUS
1000.0000 mL | Freq: Once | INTRAVENOUS | Status: AC
  Administered 2021-07-30: 14:00:00 1000 mL via INTRAVENOUS

## 2021-07-30 NOTE — MAU Provider Note (Signed)
History     CSN: [id number]  Arrival date and time: 07/30/21 1251   Event Date/Time   First Provider Initiated Contact with Patient 07/30/21 1355      Chief Complaint  Patient presents with   Abdominal Pain   vaginal pressure   HPI Roberta Baker is a 35 y.o. G2P1001 at [redacted]w[redacted]d who presents with vaginal pressure. She states it feels like it did when she had a foley balloon in for induction. She also reports intermittent contractions. She is unsure how often. She reports pain in her lower abdomen when she moves. At rest, she reports the pain is bearable. She is very concerned she is going in to labor because her vaginal introitus appears more open than normal. She denies any bleeding or leaking. Reports normal fetal movement.   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    Past Surgical History:  Procedure Laterality Date   BREAST ENHANCEMENT SURGERY     BREAST SURGERY      Family History  Problem Relation Age of Onset   Mental illness Mother    Seizures Mother    Cancer Maternal Uncle    Cancer Maternal Grandmother        breast cancer   Heart attack Maternal Grandfather     Social History   Tobacco Use   Smoking status: Every Day    Packs/day: 0.50    Years: 0.50    Pack years: 0.25    Types: Cigarettes   Smokeless tobacco: Never  Vaping Use   Vaping Use: Never used  Substance Use Topics   Alcohol use: No   Drug use: Not Currently    Types: Marijuana    Comment: occasional    Allergies:  Allergies  Allergen Reactions   Sulfa Antibiotics Nausea And Vomiting     Medications Prior to Admission  Medication Sig Dispense Refill Last Dose   Doxylamine-Pyridoxine (DICLEGIS) 10-10 MG TBEC 2 tabs q hs, if sx persist add 1 tab q am on day 3, if sx persist add 1 tab q afternoon on day 4 100 tablet 6 Past Week   ferrous sulfate 325 (65 FE) MG tablet Take 1 tablet (325 mg total) by mouth every other day. (Patient not taking: No sig reported) 45 tablet 2    promethazine (PHENERGAN) 25 MG tablet TAKE 1 TABLET(25 MG) BY MOUTH EVERY 6 HOURS AS NEEDED FOR NAUSEA OR VOMITING 30 tablet 2 07/30/2021 at 1100   Blood Pressure Monitor MISC For regular home bp monitoring during pregnancy 1 each 0    diphenhydrAMINE HCl (BENADRYL PO) Take by mouth. (Patient not taking: Reported on 07/12/2021)      hydrOXYzine (VISTARIL) 25 MG capsule Take 1 capsule (25 mg total)  by mouth 2 (two) times daily as needed for anxiety. (Patient not taking: No sig reported) 60 capsule 3    nitrofurantoin, macrocrystal-monohydrate, (MACROBID) 100 MG capsule Take 1 capsule (100 mg total) by mouth 2 (two) times daily. X 7 days (Patient not taking: No sig reported) 14 capsule 0    terconazole (TERAZOL 7) 0.4 % vaginal cream Place 1 applicator vaginally at bedtime. X 7 nights (Patient not taking: No sig reported) 45 g 0     Review of Systems  Constitutional: Negative.  Negative for fatigue and fever.  HENT: Negative.    Respiratory: Negative.  Negative for shortness of breath.   Cardiovascular: Negative.  Negative for chest pain.  Gastrointestinal:  Positive for abdominal pain. Negative for constipation, diarrhea, nausea and vomiting.  Genitourinary: Negative.  Negative for dysuria, vaginal bleeding and vaginal discharge.  Neurological: Negative.  Negative for dizziness and headaches.  Physical Exam   Blood pressure 113/62, pulse (!) 104, temperature 98.2 F (36.8 C), temperature source Oral, resp. rate 17, height 5' (1.524 m), weight 59.2 kg, last menstrual period 11/28/2020, SpO2 98 %.  Physical  Exam Vitals and nursing note reviewed.  Constitutional:      General: She is not in acute distress.    Appearance: She is well-developed.  HENT:     Head: Normocephalic.  Eyes:     Pupils: Pupils are equal, round, and reactive to light.  Cardiovascular:     Rate and Rhythm: Normal rate and regular rhythm.     Heart sounds: Normal heart sounds.  Pulmonary:     Effort: Pulmonary effort is normal. No respiratory distress.     Breath sounds: Normal breath sounds.  Abdominal:     General: Bowel sounds are normal. There is no distension.     Palpations: Abdomen is soft.     Tenderness: There is no abdominal tenderness.  Genitourinary:    Vagina: Vaginal discharge present.  Skin:    General: Skin is warm and dry.  Neurological:     Mental Status: She is alert and oriented to person, place, and time.  Psychiatric:        Mood and Affect: Mood normal.        Behavior: Behavior normal.        Thought Content: Thought content normal.        Judgment: Judgment normal.   Fetal Tracing:  Baseline: 150 Variability: moderate Accels: 15x15 Decels: none  Toco: occasional uc's  Dilation: 1 Effacement (%): 50 Station: -1 Presentation: Vertex Exam by:: C. Neil,CNM  MAU Course  Procedures Results for orders placed or performed during the hospital encounter of 07/30/21 (from the past 24 hour(s))  Urinalysis, Routine w reflex microscopic Urine, Clean Catch     Status: Abnormal   Collection Time: 07/30/21  1:26 PM  Result Value Ref Range   Color, Urine YELLOW YELLOW   APPearance CLEAR CLEAR   Specific Gravity, Urine 1.005 1.005 - 1.030   pH 7.0 5.0 - 8.0   Glucose, UA NEGATIVE NEGATIVE mg/dL   Hgb urine dipstick SMALL (A) NEGATIVE   Bilirubin Urine NEGATIVE NEGATIVE   Ketones, ur NEGATIVE NEGATIVE mg/dL   Protein, ur NEGATIVE NEGATIVE mg/dL   Nitrite NEGATIVE NEGATIVE   Leukocytes,Ua SMALL (A) NEGATIVE   RBC / HPF 0-5 0 - 5 RBC/hpf   WBC, UA 0-5 0 - 5 WBC/hpf   Bacteria, UA  NONE SEEN NONE SEEN   Squamous Epithelial / LPF 0-5 0 - 5  Mucus PRESENT     MDM UA LR Bolus Cervix unchanged after 2.5 hours.  Offered flexeril for round ligament pain- patient declines and states she does not want to take pain medication  Thick white adherent discharge with cervical exam- will treat for yeast. Patient is requesting Diflucan, states she has tried creams multiple times with no relief. Evidence in pregnancy reviewed and patient still desires.  Encouraged patient to use pregnancy support belt. She reports this is unbearable to wear.  Assessment and Plan   1. Pain of round ligament during pregnancy   2. Candidiasis of vagina during pregnancy   3. [redacted] weeks gestation of pregnancy    -Discharge home in stable condition -Rx for diflucan sent to patient's pharmacy -Abdominal pain precautions discussed -Patient advised to follow-up with OB as scheduled for prenatal care -Patient may return to MAU as needed or if her condition were to change or worsen   Rolm Bookbinder CNM 07/30/2021, 1:55 PM

## 2021-07-30 NOTE — Discharge Instructions (Signed)

## 2021-07-30 NOTE — MAU Note (Signed)
...  [name] is a 35 y.o. at [redacted]w[redacted]d here in MAU reporting: Occasional abdominal pain that happens once or twice each day for the past week. She states when this pain occurs, she feels it for about 30 minutes. She states she feels vaginal pressure when this pain occurs and it's as if "there is a foley balloon thingy in there like I had when y'all induced me." She also states last night she showered and looked at her vagina and felt as if it was more open that normal so she is concerned if she is dilating. She last felt this pain around 0800 or 0900. No VB or LOF. +FM. No pain currently but endorses pressure that does not hurt. She states she is nervous that she is going into labor.  She also states she she has nausea and vomiting every day and is currently taking Phenergan and Diclegis but states she is out of Diclegis.  Pain score:  10/10 lower abdominal pain 10/10 vaginal pressure  Vitals:   07/30/21 1310  BP: 113/62  Pulse: (!) 104  Resp: 17  Temp: 98.2 F (36.8 C)  SpO2: 98%     FHT: 150 doppler Lab orders placed from triage: UA

## 2021-07-31 LAB — CULTURE, OB URINE: Culture: <10000 — AB

## 2021-08-09 ENCOUNTER — Encounter: Payer: Self-pay | Admitting: Women's Health

## 2021-08-09 ENCOUNTER — Other Ambulatory Visit (HOSPITAL_COMMUNITY)
Admission: RE | Admit: 2021-08-09 | Discharge: 2021-08-09 | Disposition: A | Discharge status: Home / Self Care | Payer: Medicaid Other | Source: Ambulatory Visit | Attending: Advanced Practice Midwife | Admitting: Advanced Practice Midwife

## 2021-08-09 ENCOUNTER — Other Ambulatory Visit: Payer: Self-pay

## 2021-08-09 ENCOUNTER — Ambulatory Visit (INDEPENDENT_AMBULATORY_CARE_PROVIDER_SITE_OTHER): Payer: Medicaid Other | Admitting: Women's Health

## 2021-08-09 VITALS — BP 106/60 | HR 89 | Wt 133.0 lb

## 2021-08-09 DIAGNOSIS — Z3480 Encounter for supervision of other normal pregnancy, unspecified trimester: Secondary | ICD-10-CM | POA: Diagnosis present

## 2021-08-09 DIAGNOSIS — R8271 Bacteriuria: Secondary | ICD-10-CM

## 2021-08-09 DIAGNOSIS — O99891 Other specified diseases and conditions complicating pregnancy: Secondary | ICD-10-CM

## 2021-08-09 DIAGNOSIS — Z3483 Encounter for supervision of other normal pregnancy, third trimester: Secondary | ICD-10-CM

## 2021-08-09 DIAGNOSIS — B009 Herpesviral infection, unspecified: Secondary | ICD-10-CM

## 2021-08-09 DIAGNOSIS — Z3A36 36 weeks gestation of pregnancy: Secondary | ICD-10-CM | POA: Diagnosis present

## 2021-08-09 DIAGNOSIS — Z8759 Personal history of other complications of pregnancy, childbirth and the puerperium: Secondary | ICD-10-CM

## 2021-08-09 DIAGNOSIS — O2343 Unspecified infection of urinary tract in pregnancy, third trimester: Secondary | ICD-10-CM

## 2021-08-09 DIAGNOSIS — R825 Elevated urine levels of drugs, medicaments and biological substances: Secondary | ICD-10-CM

## 2021-08-09 LAB — POCT URINALYSIS DIPSTICK OB
Glucose, UA: NEGATIVE
Ketones, UA: NEGATIVE
Nitrite, UA: POSITIVE

## 2021-08-09 MED ORDER — ACYCLOVIR 400 MG PO TABS: 400.0000 mg | ORAL_TABLET | Freq: Three times a day (TID) | ORAL | 3 refills | Status: DC

## 2021-08-09 MED ORDER — CEPHALEXIN 500 MG PO CAPS: 500.0000 mg | ORAL_CAPSULE | Freq: Four times a day (QID) | ORAL | 0 refills | Status: DC

## 2021-08-09 NOTE — Progress Notes (Signed)
LOW-RISK PREGNANCY VISIT Patient name: RETIA CORDLE MRN [id number]  Date of birth: 1986/06/03 Chief Complaint:   Routine Prenatal Visit (Having pressure & contractions)  History of Present Illness:   Roberta Baker is a 35 y.o. G32P1001 female at [redacted]w[redacted]d with an Estimated Date of Delivery: 09/04/21 being seen today for ongoing management of a low-risk pregnancy.   Today she reports  feels awful. Lots of pelvic pain/pressure/contractions. Denies lof/vb. Denies UTI sx. Trouble sleeping. Missed an appt.  . Contractions: Irregular.  .  Movement: Present. denies leaking of fluid.  Depression screen [name] 2/9 06/02/2021 02/28/2021 06/07/2017 12/12/2016  Decreased Interest 2 1 0 0  Down, Depressed, Hopeless 3 3 0 1  PHQ - 2 Score 5 4 0 1  Altered sleeping 1 2 0 1  Tired, decreased energy 2 3 0 1  Change in appetite 0 2 0 -  Feeling bad or failure about yourself  3 3 0 0  Trouble concentrating 2 1 0 0  Moving slowly or fidgety/restless 0 0 0 0  Suicidal thoughts 0 0 0 0  PHQ-9 Score 13 15 0 3  Difficult doing work/chores - - Not difficult at all -     GAD 7 : Generalized Anxiety Score 02/28/2021  Nervous, Anxious, on Edge 3  Control/stop worrying 3  Worry too much - different things 3  Trouble relaxing 3  Restless 2  Easily annoyed or irritable 3  Afraid - awful might happen 3  Total GAD 7 Score 20      Review of Systems:   Pertinent items are noted in HPI Denies abnormal vaginal discharge w/ itching/odor/irritation, headaches, visual changes, shortness of breath, chest pain, abdominal pain, severe nausea/vomiting, or problems with urination or bowel movements unless otherwise stated above. Pertinent History Reviewed:  Reviewed past medical,surgical, social, obstetrical and family history.  Reviewed problem list, medications and allergies. Physical Assessment:   Vitals:   08/09/21 1107  BP: 106/60  Pulse: 89  Weight: 133 lb (60.3 kg)  Body mass index is 25.97  kg/m.        Physical Examination:   General appearance: Well appearing, and in no distress  Mental status: Alert, oriented to person, place, and time  Skin: Warm & dry  Cardiovascular: Normal heart rate noted  Respiratory: Normal respiratory effort, no distress  Abdomen: Soft, gravid, nontender  Pelvic: Cervical exam performed  Dilation: 2 Effacement (%): 50 Station: -2  Extremities:    Fetal Status: Fetal Heart Rate (bpm): 138 Fundal Height: 34 cm Movement: Present Presentation: Vertex  Chaperone: Malachy Mood   Results for orders placed or performed in visit on 08/09/21 (from the past 24 hour(s))  POC Urinalysis Dipstick OB   Collection Time: 08/09/21 11:14 AM  Result Value Ref Range   Color, UA     Clarity, UA     Glucose, UA Negative Negative   Bilirubin, UA     Ketones, UA neg    Spec Grav, UA     Blood, UA trace    pH, UA     POC,PROTEIN,UA Trace Negative, Trace, Small (1+), Moderate (2+), Large (3+), 4+   Urobilinogen, UA     Nitrite, UA positive    Leukocytes, UA Trace (A) Negative   Appearance     Odor      Assessment & Plan:  1) Low-risk pregnancy G2P1001 at [redacted]w[redacted]d with an Estimated Date of Delivery: 09/04/21   2) H/O FGR, last EFW 40% @ 26wks, will  schedule next u/s  3) 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    Dispense:  90 tablet    Refill:  3    Order Specific Question:   Supervising Provider    Answer:   Duane Lope H [[id number]]   cephALEXin (KEFLEX) 500 MG capsule    Sig: Take 1 capsule (500 mg total) by mouth 4 (four) times daily. X 7 days    Dispense:  28 capsule    Refill:  0    Order Specific Question:   Supervising Provider    Answer:   Duane Lope H [[id number]]    Labs/procedures today: GBS, GC/CT, SVE, and urine culture  Plan:  Continue routine obstetrical care  Next visit: prefers in person    Reviewed: Preterm labor symptoms and general obstetric precautions including but not limited to vaginal bleeding, contractions, leaking of fluid and fetal movement were reviewed in detail with the patient.  All questions were answered. Does have home bp cuff. Office bp cuff given: not applicable. Check bp weekly, let us know if consistently >140 and/or >90.  Follow-up: Return for 1st available, US:EFW; , weekly, LROB, CNM, in person.  No future appointments.  Orders Placed This Encounter  Procedures   Strep Gp B NAA   Urine Culture   US OB Follow Up   Pain Management Screening Profile (10S)   POC Urinalysis Dipstick OB   Cheral Marker CNM, [facility] 08/09/2021 11:44 AM

## 2021-08-09 NOTE — Patient Instructions (Addendum)
Roberta Baker, thank you for choosing our office today! We appreciate the opportunity to meet your healthcare needs. You may receive a short survey by mail, e-mail, or through [employer]. If you are happy with your care we would appreciate if you could take just a few minutes to complete the survey questions. We read all of your comments and take your feedback very seriously. Thank you again for choosing our office.  Center for [employer] Healthcare Team at [facility]  [facility] at [name] ([address] [place], [place] [address]) Entrance C, located off of [address] 24/7 valet parking   Tips to Help You Sleep Better:  Get into a bedtime routine, try to do the same thing every night before going to bed to try to help your body wind down Warm baths Avoid caffeine for at least 3 hours before going to sleep  Keep your room at a slightly cooler temperature, can try running a fan Turn off TV, lights, phone, electronics Lots of pillows if needed to help you get comfortable Lavender scented items can help you sleep. You can place lavender essential oil on a cotton ball and place under your pillowcase, or place in a diffuser. [employer] has a lavender scented sleep line (plug-ins, sprays, etc). Look in the pillow aisle for lavender scented pillows.  If none of the above things help, you can try 1/2 to 1 tablet of benadryl, unisom, or tylenol pm. Do not take this every night, only when you really need it.    CLASSES: Go to [email or web address] to register for classes (childbirth, breastfeeding, waterbirth, infant CPR, daddy bootcamp, etc.)  Call the office [phone]) or go to [facility] if: You begin to have strong, frequent contractions Your water breaks.  Sometimes it is a big gush of fluid, sometimes it is just a trickle that keeps getting your panties wet or running down your legs You have vaginal bleeding.  It is normal to have a small amount of spotting if your cervix was  checked.  You don't feel your baby moving like normal.  If you don't, get you something to eat and drink and lay down and focus on feeling your baby move.   If your baby is still not moving like normal, you should call the office or go to [facility].  Call the office [phone]) or go to [name] hospital for these signs of pre-eclampsia: Severe headache that does not go away with Tylenol Visual changes- seeing spots, double, blurred vision Pain under your right breast or upper abdomen that does not go away with Tums or heartburn medicine Nausea and/or vomiting Severe swelling in your hands, feet, and face   Tdap Vaccine It is recommended that you get the Tdap vaccine during the third trimester of EACH pregnancy to help protect your baby from getting pertussis (whooping cough) 27-36 weeks is the BEST time to do this so that you can pass the protection on to your baby. During pregnancy is better than after pregnancy, but if you are unable to get it during pregnancy it will be offered at the hospital.  You can get this vaccine with [place], at the health department, your family doctor, or some local pharmacies Everyone who will be around your baby should also be up-to-date on their vaccines before the baby comes. Adults (who are not pregnant) only need 1 dose of Tdap during adulthood.   [facility] Pediatricians/Family Doctors [facility]): [address], [phone]  [facility]: [address], [phone]                [facility]): [address], [phone] (call to ask if accepting patients) [name] Department: [address], [place], [phone]    [facility] Pediatricians/Family Doctors [facility]): [phone] [address], [phone] [facility]: [address] [place], [phone] [name] [place]: [address],  [phone]  [facility] Doctors  [facility]): [phone] [facility]: [address] [phone]   [facility] Doctors [name]: [address], [phone]  [facility] Doctors  [facility]: [phone], [phone]  Home Blood Pressure Monitoring for Patients   Your provider has recommended that you check your blood pressure (BP) at least once a week at home. If you do not have a blood pressure cuff at home, one will be provided for you. Contact your provider if you have not received your monitor within 1 week.   Helpful Tips for Accurate Home Blood Pressure Checks  Don't smoke, exercise, or drink caffeine 30 minutes before checking your BP Use the restroom before checking your BP (a full bladder can raise your pressure) Relax in a comfortable upright chair Feet on the ground Left arm resting comfortably on a flat surface at the level of your heart Legs uncrossed Back supported Sit quietly and don't talk Place the cuff on your bare arm Adjust snuggly, so that only two fingertips can fit between your skin and the top of the cuff Check 2 readings separated by at least one minute Keep a log of your BP readings For a visual, please reference this diagram: http://ccnc.care/bpdiagram  Provider Name: [facility]     Phone: [phone]  Zone 1: ALL CLEAR  Continue to monitor your symptoms:  BP reading is less than 140 (top number) or less than 90 (bottom number)  No right upper stomach pain No headaches or seeing spots No feeling nauseated or throwing up No swelling in face and hands  Zone 2: CAUTION Call your doctor's office for any of the following:  BP reading is greater than 140 (top number) or greater than 90 (bottom number)  Stomach pain under your ribs in the middle or right side Headaches or seeing spots Feeling nauseated or throwing up Swelling in face  and hands  Zone 3: EMERGENCY  Seek immediate medical care if you have any of the following:  BP reading is greater than160 (top number) or greater than 110 (bottom number) Severe headaches not improving with Tylenol Serious difficulty catching your breath Any worsening symptoms from Zone 2  Preterm Labor and Birth Information  The normal length of a pregnancy is 39-41 weeks. Preterm labor is when labor starts before 37 completed weeks of pregnancy. What are the risk factors for preterm labor? Preterm labor is more likely to occur in women who: Have certain infections during pregnancy such as a bladder infection, sexually transmitted infection, or infection inside the uterus (chorioamnionitis). Have a shorter-than-normal cervix. Have gone into preterm labor before. Have had surgery on their cervix. Are younger than age 66 or older than age 66. Are African American. Are pregnant with twins or multiple babies (multiple gestation). Take street drugs or smoke while pregnant. Do not gain enough weight while pregnant. Became pregnant shortly after having been pregnant. What are the symptoms of  preterm labor? Symptoms of preterm labor include: Cramps similar to those that can happen during a menstrual period. The cramps may happen with diarrhea. Pain in the abdomen or lower back. Regular uterine contractions that may feel like tightening of the abdomen. A feeling of increased pressure in the pelvis. Increased watery or bloody mucus discharge from the vagina. Water breaking (ruptured amniotic sac). Why is it important to recognize signs of preterm labor? It is important to recognize signs of preterm labor because babies who are born prematurely may not be fully developed. This can put them at an increased risk for: Long-term (chronic) heart and lung problems. Difficulty immediately after birth with regulating body systems, including blood sugar, body temperature, heart rate, and breathing  rate. Bleeding in the brain. Cerebral palsy. Learning difficulties. Death. These risks are highest for babies who are born before 34 weeks of pregnancy. How is preterm labor treated? Treatment depends on the length of your pregnancy, your condition, and the health of your baby. It may involve: Having a stitch (suture) placed in your cervix to prevent your cervix from opening too early (cerclage). Taking or being given medicines, such as: Hormone medicines. These may be given early in pregnancy to help support the pregnancy. Medicine to stop contractions. Medicines to help mature the baby's lungs. These may be prescribed if the risk of delivery is high. Medicines to prevent your baby from developing cerebral palsy. If the labor happens before 34 weeks of pregnancy, you may need to stay in the hospital. What should I do if I think I am in preterm labor? If you think that you are going into preterm labor, call your health care provider right away. How can I prevent preterm labor in future pregnancies? To increase your chance of having a full-term pregnancy: Do not use any tobacco products, such as cigarettes, chewing tobacco, and e-cigarettes. If you need help quitting, ask your health care provider. Do not use street drugs or medicines that have not been prescribed to you during your pregnancy. Talk with your health care provider before taking any herbal supplements, even if you have been taking them regularly. Make sure you gain a healthy amount of weight during your pregnancy. Watch for infection. If you think that you might have an infection, get it checked right away. Make sure to tell your health care provider if you have gone into preterm labor before. This information is not intended to replace advice given to you by your health care provider. Make sure you discuss any questions you have with your health care provider. Document Revised: 12/12/2018 Document Reviewed:  01/11/2016 Elsevier Patient Education  2020 [employer].

## 2021-08-10 ENCOUNTER — Encounter: Payer: Medicaid Other | Admitting: Women's Health

## 2021-08-10 LAB — CERVICOVAGINAL ANCILLARY ONLY
Chlamydia: NEGATIVE
Comment: NEGATIVE
Comment: NORMAL
Neisseria Gonorrhea: NEGATIVE

## 2021-08-10 LAB — MED LIST OPTION NOT SELECTED

## 2021-08-11 LAB — STREP GP B NAA: Strep Gp B NAA: NEGATIVE

## 2021-08-12 ENCOUNTER — Encounter: Payer: Self-pay | Admitting: Women's Health

## 2021-08-12 LAB — URINE CULTURE

## 2021-08-13 LAB — PMP SCREEN PROFILE (10S), URINE
Amphetamine Scrn, Ur: NEGATIVE ng/mL
BARBITURATE SCREEN URINE: NEGATIVE ng/mL
BENZODIAZEPINE SCREEN, URINE: NEGATIVE ng/mL
CANNABINOIDS UR QL SCN: POSITIVE ng/mL — AB
Cocaine (Metab) Scrn, Ur: NEGATIVE ng/mL
Creatinine(Crt), U: 75 mg/dL (ref 20.0–300.0)
Methadone Screen, Urine: NEGATIVE ng/mL
OXYCODONE+OXYMORPHONE UR QL SCN: NEGATIVE ng/mL
Opiate Scrn, Ur: NEGATIVE ng/mL
Ph of Urine: 6.1 (ref 4.5–8.9)
Phencyclidine Qn, Ur: NEGATIVE ng/mL
Propoxyphene Scrn, Ur: NEGATIVE ng/mL

## 2021-08-14 ENCOUNTER — Encounter: Payer: Medicaid Other | Admitting: Obstetrics & Gynecology

## 2021-08-14 ENCOUNTER — Other Ambulatory Visit: Payer: Medicaid Other

## 2021-08-16 ENCOUNTER — Ambulatory Visit (INDEPENDENT_AMBULATORY_CARE_PROVIDER_SITE_OTHER): Payer: Medicaid Other | Admitting: Obstetrics & Gynecology

## 2021-08-16 ENCOUNTER — Other Ambulatory Visit: Payer: Self-pay

## 2021-08-16 ENCOUNTER — Other Ambulatory Visit (INDEPENDENT_AMBULATORY_CARE_PROVIDER_SITE_OTHER): Payer: Medicaid Other

## 2021-08-16 VITALS — Wt 132.0 lb

## 2021-08-16 DIAGNOSIS — O365930 Maternal care for other known or suspected poor fetal growth, third trimester, not applicable or unspecified: Secondary | ICD-10-CM

## 2021-08-16 DIAGNOSIS — Z3483 Encounter for supervision of other normal pregnancy, third trimester: Secondary | ICD-10-CM

## 2021-08-16 DIAGNOSIS — Z8759 Personal history of other complications of pregnancy, childbirth and the puerperium: Secondary | ICD-10-CM | POA: Diagnosis not present

## 2021-08-16 DIAGNOSIS — O219 Vomiting of pregnancy, unspecified: Secondary | ICD-10-CM

## 2021-08-16 DIAGNOSIS — Z3A37 37 weeks gestation of pregnancy: Secondary | ICD-10-CM

## 2021-08-16 DIAGNOSIS — Z3480 Encounter for supervision of other normal pregnancy, unspecified trimester: Secondary | ICD-10-CM

## 2021-08-16 MED ORDER — PROMETHAZINE HCL 25 MG PO TABS: ORAL_TABLET | ORAL | 2 refills | Status: DC

## 2021-08-16 NOTE — Progress Notes (Signed)
° °  LOW-RISK PREGNANCY VISIT Patient name: Roberta Baker MRN [id number]  Date of birth: [birth year] Chief Complaint:   Routine Prenatal Visit  History of Present Illness:   Roberta Baker is a 35 y.o. G71P1001 female at [redacted]w[redacted]d with an Estimated Date of Delivery: 09/04/21 being seen today for ongoing management of a low-risk pregnancy.   -Last visit measuring small for dates, h/o FGR- [place] being completed today -UTI, currently on ABX, []  TOC next visit  Depression screen [facility] 2/9 06/02/2021 02/28/2021 06/07/2017 12/12/2016  Decreased Interest 2 1 0 0  Down, Depressed, Hopeless 3 3 0 1  PHQ - 2 Score 5 4 0 1  Altered sleeping 1 2 0 1  Tired, decreased energy 2 3 0 1  Change in appetite 0 2 0 -  Feeling bad or failure about yourself  3 3 0 0  Trouble concentrating 2 1 0 0  Moving slowly or fidgety/restless 0 0 0 0  Suicidal thoughts 0 0 0 0  PHQ-9 Score 13 15 0 3  Difficult doing work/chores - - Not difficult at all -    Today she reports occasional contractions. Contractions: Irregular. Vag. Bleeding: None.  Movement: Present. denies leaking of fluid. Review of Systems:   Pertinent items are noted in HPI Denies abnormal vaginal discharge w/ itching/odor/irritation, headaches, visual changes, shortness of breath, chest pain, abdominal pain, severe nausea/vomiting, or problems with urination or bowel movements unless otherwise stated above. Pertinent History Reviewed:  Reviewed past medical,surgical, social, obstetrical and family history.  Reviewed problem list, medications and allergies.  Physical Assessment:   Vitals:   08/16/21 0826  Weight: 132 lb (59.9 kg)  Body mass index is 25.78 kg/m.        Physical Examination:   General appearance: Well appearing, and in no distress  Mental status: Alert, oriented to person, place, and time  Skin: Warm & dry  Respiratory: Normal respiratory effort, no distress  Abdomen: Soft, gravid, nontender  Pelvic: Cervical exam deferred          Extremities: Edema: None  Psych:  mood and affect appropriate  Fetal Status:     Movement: Present    Growth scan today- cephalic,fhr 150 BPM,posterior placenta gr 3,AFI 12 cm,EFW 2799 g 24%  Chaperone: n/a    No results found for this or any previous visit (from the past 24 hour(s)).   Assessment & Plan:  1) Low-risk pregnancy G2P1001 at [redacted]w[redacted]d with an Estimated Date of Delivery: 09/04/21   2) h/o FGR, small for dates- EFW  3) HSV- no outbreaks, on suppression therapy   Meds:  Meds ordered this encounter  Medications   promethazine (PHENERGAN) 25 MG tablet    Sig: TAKE 1 TABLET(25 MG) BY MOUTH EVERY 6 HOURS AS NEEDED FOR NAUSEA OR VOMITING    Dispense:  30 tablet    Refill:  2   Labs/procedures today: growth scan  Plan:  Continue routine obstetrical care  Next visit: prefers in person    Reviewed: Term labor symptoms and general obstetric precautions including but not limited to vaginal bleeding, contractions, leaking of fluid and fetal movement were reviewed in detail with the patient.  All questions were answered.   Follow-up: Return in about 1 week (around 08/23/2021) for LROB visit.   08/25/2021, DO Attending Obstetrician & Gynecologist, [facility] for [facility]

## 2021-08-16 NOTE — Progress Notes (Signed)
[place] 37+2 wks,cephalic,fhr 150 BPM,posterior placenta gr 3,AFI 12 cm,EFW 2799 g 24%

## 2021-08-18 ENCOUNTER — Encounter: Payer: Self-pay | Admitting: Women's Health

## 2021-08-23 ENCOUNTER — Other Ambulatory Visit: Payer: Self-pay

## 2021-08-23 ENCOUNTER — Encounter: Payer: Medicaid Other | Admitting: Advanced Practice Midwife

## 2021-08-24 ENCOUNTER — Encounter: Payer: Medicaid Other | Admitting: Women's Health

## 2021-08-26 ENCOUNTER — Other Ambulatory Visit: Payer: Self-pay

## 2021-08-26 ENCOUNTER — Inpatient Hospital Stay (HOSPITAL_COMMUNITY)
Admission: AD | Admit: 2021-08-26 | Discharge: 2021-08-27 | DRG: 806 | Disposition: A | Discharge status: Home / Self Care | Payer: Medicaid Other | Attending: Obstetrics & Gynecology | Admitting: Obstetrics & Gynecology

## 2021-08-26 ENCOUNTER — Encounter (HOSPITAL_COMMUNITY): Payer: Self-pay | Admitting: Obstetrics & Gynecology

## 2021-08-26 DIAGNOSIS — Z3A38 38 weeks gestation of pregnancy: Secondary | ICD-10-CM

## 2021-08-26 DIAGNOSIS — B009 Herpesviral infection, unspecified: Secondary | ICD-10-CM | POA: Diagnosis present

## 2021-08-26 DIAGNOSIS — O9832 Other infections with a predominantly sexual mode of transmission complicating childbirth: Secondary | ICD-10-CM | POA: Diagnosis present

## 2021-08-26 DIAGNOSIS — A6000 Herpesviral infection of urogenital system, unspecified: Secondary | ICD-10-CM | POA: Diagnosis present

## 2021-08-26 DIAGNOSIS — F17200 Nicotine dependence, unspecified, uncomplicated: Secondary | ICD-10-CM | POA: Diagnosis present

## 2021-08-26 DIAGNOSIS — O99334 Smoking (tobacco) complicating childbirth: Secondary | ICD-10-CM | POA: Diagnosis present

## 2021-08-26 DIAGNOSIS — O26893 Other specified pregnancy related conditions, third trimester: Secondary | ICD-10-CM | POA: Diagnosis present

## 2021-08-26 DIAGNOSIS — F17210 Nicotine dependence, cigarettes, uncomplicated: Secondary | ICD-10-CM | POA: Diagnosis present

## 2021-08-26 DIAGNOSIS — F1290 Cannabis use, unspecified, uncomplicated: Secondary | ICD-10-CM | POA: Diagnosis present

## 2021-08-26 DIAGNOSIS — F418 Other specified anxiety disorders: Secondary | ICD-10-CM | POA: Diagnosis present

## 2021-08-26 DIAGNOSIS — O99324 Drug use complicating childbirth: Secondary | ICD-10-CM | POA: Diagnosis present

## 2021-08-26 DIAGNOSIS — Z20822 Contact with and (suspected) exposure to covid-19: Secondary | ICD-10-CM | POA: Diagnosis present

## 2021-08-26 DIAGNOSIS — Z3480 Encounter for supervision of other normal pregnancy, unspecified trimester: Secondary | ICD-10-CM

## 2021-08-26 HISTORY — DX: Papillomavirus as the cause of diseases classified elsewhere: B97.7

## 2021-08-26 LAB — CBC
HCT: 34.9 % — ABNORMAL LOW (ref 36.0–46.0)
Hemoglobin: 11.8 g/dL — ABNORMAL LOW (ref 12.0–15.0)
MCH: 29.3 pg (ref 26.0–34.0)
MCHC: 33.8 g/dL (ref 30.0–36.0)
MCV: 86.6 fL (ref 80.0–100.0)
Platelets: 481 10*3/uL — ABNORMAL HIGH (ref 150–400)
RBC: 4.03 MIL/uL (ref 3.87–5.11)
RDW: 15.1 % (ref 11.5–15.5)
WBC: 26.9 10*3/uL — ABNORMAL HIGH (ref 4.0–10.5)
nRBC: 0 % (ref 0.0–0.2)

## 2021-08-26 LAB — RAPID URINE DRUG SCREEN, HOSP PERFORMED
Amphetamines: NOT DETECTED
Barbiturates: NOT DETECTED
Benzodiazepines: NOT DETECTED
Cocaine: NOT DETECTED
Opiates: NOT DETECTED
Tetrahydrocannabinol: POSITIVE — AB

## 2021-08-26 LAB — TYPE AND SCREEN
ABO/RH(D): A POS
Antibody Screen: NEGATIVE

## 2021-08-26 LAB — RESP PANEL BY RT-PCR (FLU A&B, COVID) ARPGX2
Influenza A by PCR: NEGATIVE
Influenza B by PCR: NEGATIVE
SARS Coronavirus 2 by RT PCR: NEGATIVE

## 2021-08-26 MED ORDER — CYCLOBENZAPRINE HCL 10 MG PO TABS
5.0000 mg | ORAL_TABLET | Freq: Once | ORAL | Status: AC
  Administered 2021-08-26: 20:00:00 5 mg via ORAL
  Filled 2021-08-26: qty 1

## 2021-08-26 MED ORDER — LACTATED RINGERS IV SOLN: 500.0000 mL | INTRAVENOUS | Status: DC | PRN

## 2021-08-26 MED ORDER — MEDROXYPROGESTERONE ACETATE 150 MG/ML IM SUSP
150.0000 mg | INTRAMUSCULAR | Status: AC | PRN
  Administered 2021-08-27: 12:00:00 150 mg via INTRAMUSCULAR
  Filled 2021-08-26: qty 1

## 2021-08-26 MED ORDER — ACETAMINOPHEN 325 MG PO TABS: 650.0000 mg | ORAL_TABLET | ORAL | Status: DC | PRN

## 2021-08-26 MED ORDER — OXYTOCIN 10 UNIT/ML IJ SOLN
INTRAMUSCULAR | Status: AC
  Filled 2021-08-26: qty 1

## 2021-08-26 MED ORDER — TRANEXAMIC ACID-NACL 1000-0.7 MG/100ML-% IV SOLN
INTRAVENOUS | Status: AC
  Administered 2021-08-26: 14:00:00 1000 mg via INTRAVENOUS
  Filled 2021-08-26: qty 100

## 2021-08-26 MED ORDER — OXYTOCIN 10 UNIT/ML IJ SOLN
10.0000 [IU] | Freq: Once | INTRAMUSCULAR | Status: AC
  Administered 2021-08-26: 13:00:00 10 [IU] via INTRAMUSCULAR

## 2021-08-26 MED ORDER — ONDANSETRON HCL 4 MG PO TABS: 4.0000 mg | ORAL_TABLET | ORAL | Status: DC | PRN

## 2021-08-26 MED ORDER — ACETAMINOPHEN 325 MG PO TABS
650.0000 mg | ORAL_TABLET | ORAL | Status: DC | PRN
  Administered 2021-08-26: 650 mg via ORAL
  Filled 2021-08-26: qty 2

## 2021-08-26 MED ORDER — TRANEXAMIC ACID-NACL 1000-0.7 MG/100ML-% IV SOLN: 1000.0000 mg | INTRAVENOUS | Status: AC

## 2021-08-26 MED ORDER — SIMETHICONE 80 MG PO CHEW: 80.0000 mg | CHEWABLE_TABLET | ORAL | Status: DC | PRN

## 2021-08-26 MED ORDER — SOD CITRATE-CITRIC ACID 500-334 MG/5ML PO SOLN: 30.0000 mL | ORAL | Status: DC | PRN

## 2021-08-26 MED ORDER — DIPHENHYDRAMINE HCL 25 MG PO CAPS: 25.0000 mg | ORAL_CAPSULE | Freq: Four times a day (QID) | ORAL | Status: DC | PRN

## 2021-08-26 MED ORDER — DIBUCAINE (PERIANAL) 1 % EX OINT: 1.0000 "application " | TOPICAL_OINTMENT | CUTANEOUS | Status: DC | PRN

## 2021-08-26 MED ORDER — ONDANSETRON HCL 4 MG/2ML IJ SOLN: 4.0000 mg | Freq: Four times a day (QID) | INTRAMUSCULAR | Status: DC | PRN

## 2021-08-26 MED ORDER — PRENATAL MULTIVITAMIN CH
1.0000 | ORAL_TABLET | Freq: Every day | ORAL | Status: DC
  Filled 2021-08-26: qty 1

## 2021-08-26 MED ORDER — COCONUT OIL OIL: 1.0000 "application " | TOPICAL_OIL | Status: DC | PRN

## 2021-08-26 MED ORDER — OXYCODONE-ACETAMINOPHEN 5-325 MG PO TABS: 2.0000 | ORAL_TABLET | ORAL | Status: DC | PRN

## 2021-08-26 MED ORDER — LIDOCAINE HCL (PF) 1 % IJ SOLN: 30.0000 mL | INTRAMUSCULAR | Status: DC | PRN

## 2021-08-26 MED ORDER — BENZOCAINE-MENTHOL 20-0.5 % EX AERO: 1.0000 "application " | INHALATION_SPRAY | CUTANEOUS | Status: DC | PRN

## 2021-08-26 MED ORDER — OXYTOCIN BOLUS FROM INFUSION
333.0000 mL | Freq: Once | INTRAVENOUS | Status: AC
  Administered 2021-08-26: 14:00:00 333 mL via INTRAVENOUS

## 2021-08-26 MED ORDER — HYDROXYZINE HCL 50 MG/ML IM SOLN
50.0000 mg | Freq: Four times a day (QID) | INTRAMUSCULAR | Status: DC | PRN
  Administered 2021-08-26: 13:00:00 50 mg via INTRAMUSCULAR
  Filled 2021-08-26 (×2): qty 1

## 2021-08-26 MED ORDER — TETANUS-DIPHTH-ACELL PERTUSSIS 5-2.5-18.5 LF-MCG/0.5 IM SUSY: 0.5000 mL | PREFILLED_SYRINGE | Freq: Once | INTRAMUSCULAR | Status: DC

## 2021-08-26 MED ORDER — LACTATED RINGERS IV SOLN: INTRAVENOUS | Status: DC

## 2021-08-26 MED ORDER — SENNOSIDES-DOCUSATE SODIUM 8.6-50 MG PO TABS
2.0000 | ORAL_TABLET | Freq: Every day | ORAL | Status: DC
  Administered 2021-08-27: 12:00:00 2 via ORAL
  Filled 2021-08-26: qty 2

## 2021-08-26 MED ORDER — ONDANSETRON HCL 4 MG/2ML IJ SOLN: 4.0000 mg | INTRAMUSCULAR | Status: DC | PRN

## 2021-08-26 MED ORDER — FENTANYL CITRATE (PF) 100 MCG/2ML IJ SOLN
50.0000 ug | INTRAMUSCULAR | Status: DC | PRN
  Administered 2021-08-26: 14:00:00 100 ug via INTRAVENOUS

## 2021-08-26 MED ORDER — OXYTOCIN-SODIUM CHLORIDE 30-0.9 UT/500ML-% IV SOLN
2.5000 [IU]/h | INTRAVENOUS | Status: DC
  Filled 2021-08-26: qty 500

## 2021-08-26 MED ORDER — FENTANYL CITRATE (PF) 100 MCG/2ML IJ SOLN: 100.0000 ug | Freq: Once | INTRAMUSCULAR | Status: AC

## 2021-08-26 MED ORDER — IBUPROFEN 600 MG PO TABS
600.0000 mg | ORAL_TABLET | Freq: Four times a day (QID) | ORAL | Status: DC
  Administered 2021-08-26 – 2021-08-27 (×5): 600 mg via ORAL
  Filled 2021-08-26 (×5): qty 1

## 2021-08-26 MED ORDER — OXYCODONE-ACETAMINOPHEN 5-325 MG PO TABS: 1.0000 | ORAL_TABLET | ORAL | Status: DC | PRN

## 2021-08-26 MED ORDER — WITCH HAZEL-GLYCERIN EX PADS: 1.0000 "application " | MEDICATED_PAD | CUTANEOUS | Status: DC | PRN

## 2021-08-26 MED ORDER — FENTANYL CITRATE (PF) 100 MCG/2ML IJ SOLN
INTRAMUSCULAR | Status: AC
  Administered 2021-08-26: 14:00:00 100 ug via INTRAVENOUS
  Filled 2021-08-26: qty 2

## 2021-08-26 NOTE — MAU Note (Signed)
Called from lobby, pt very uncomfortable, brought back in wc to rm.  Ctx's started 2 hrs ago, q 2-3 min. No bleeding or leaking. Denies problems with preg.  2nd baby.

## 2021-08-26 NOTE — Lactation Note (Signed)
This note was copied from a baby's chart. Lactation Consultation Note  Patient Name: Roberta Baker [id number] Date: 08/26/2021 Reason for consult: L&D Initial assessment;Early term 37-38.6wks;Infant < 6lbs;Breastfeeding assistance;Other (Comment);Breast augmentation (2nd LC visit in LD / per [name] - mom feeling better and receptive to STS + latching. Lab into draw babies blood sugar. After Blood draw, LC assisted / baby latched easily w/ depth / was still feeding w/ swallows at 10 mins . RN aware to chart total time.) Age:21 hours  Maternal Data Does the patient have breastfeeding experience prior to this delivery?: Yes How long did the patient breastfeed?: per mom attempted a few days and 1st baby would not latch well. per mom breast implants - its been 8 years . Positive breast changes during pregnancy.  Feeding Mother's Current Feeding Choice: Breast Milk  LATCH Score Latch: Grasps breast easily, tongue down, lips flanged, rhythmical sucking.  Audible Swallowing: Spontaneous and intermittent  Type of Nipple: Everted at rest and after stimulation  Comfort (Breast/Nipple): Soft / non-tender  Hold (Positioning): Assistance needed to correctly position infant at breast and maintain latch.  LATCH Score: 9   Lactation Tools Discussed/Used    Interventions Interventions: Breast feeding basics reviewed;Assisted with latch;Skin to skin;Breast massage;Breast compression;Adjust position;Support pillows;Position options;Education;LC Services brochure  Discharge    Consult Status Consult Status: Follow-up from L&D Date: 08/26/21 Follow-up type: In-patient    Roberta Baker 08/26/2021, 3:15 PM

## 2021-08-26 NOTE — Lactation Note (Signed)
This note was copied from a baby's chart. Lactation Consultation Note  Patient Name: Girl Corlette Ciano [id number] Date: 08/26/2021 Reason for consult: L&D Initial assessment;Mother's request;Primapara;1st time breastfeeding;Early term 37-38.6wks;Breast augmentation;Other (Comment) (LC LD visit 38 after delivery/ LC called LD RN/ and mom requested to be seen [facility] consult not desinated on Epic board). mom feeling nauseated when LC arrived and per RN doesn't desire to hold the baby at this time. LC will F/U.) Age:71 mins PP  Maternal Data    Feeding Mother's Current Feeding Choice: Breast Milk  LATCH Score                    Lactation Tools Discussed/Used    Interventions    Discharge    Consult Status Consult Status: Follow-up from L&D Date: 08/26/21 Follow-up type: In-patient    Matilde Sprang Tyee Vandevoorde 08/26/2021, 2:09 PM

## 2021-08-26 NOTE — Lactation Note (Addendum)
This note was copied from a baby's chart. Lactation Consultation Note  Patient Name: Roberta Baker [id number] Date: 08/26/2021 Reason for consult: Initial assessment;Mother's request;Difficult latch;Early term 37-38.6wks;Infant < 6lbs;Breastfeeding assistance Age:35 years LC assisted with latching in cross cradle prone with signs of milk transfer. Mom states latch is comfortable and signs of milk transfer noted.   LPTI guideline reviewed since infant less than 6 lbs including keeping total feeding under 30 min  Mom feeding plan breast and bottle with formula  Plan 1. To feed based on cues 8-12x 24hr period. Mom to offer breasts and look for signs of milk transfer.  2. Mom to supplement with EBM first followed by formula with slow flow bottle and pace bottle feeding.  3. DEBP q 3hrs for  4 I and O sheet reviewed.  All questions answered at the end of the visit.   LC returned, Mom states in some pain RN arrive to assist her. She decided to give formula only.   Maternal Data Has patient been taught Hand Expression?: Yes Does the patient have breastfeeding experience prior to this delivery?: Yes How long did the patient breastfeed?: few days with first son 4 years ago, " alot going on that time" not able to get infant to latch  Feeding Mother's Current Feeding Choice: Breast Milk and Formula  LATCH Score Latch: Repeated attempts needed to sustain latch, nipple held in mouth throughout feeding, stimulation needed to elicit sucking reflex.  Audible Swallowing: Spontaneous and intermittent  Type of Nipple: Flat (will evert with stimulation and pumping)  Comfort (Breast/Nipple): Soft / non-tender  Hold (Positioning): Assistance needed to correctly position infant at breast and maintain latch.  LATCH Score: 7   Lactation Tools Discussed/Used Tools: Pump;Flanges Flange Size: 21 Breast pump type: Double-Electric Breast Pump Pump Education: Setup, frequency, and  cleaning;Milk Storage Reason for Pumping: increase stimulation Pumping frequency: every 3 hrs for 15 min  Interventions Interventions: Breast feeding basics reviewed;Assisted with latch;Skin to skin;Breast massage;Pre-pump if needed;Hand express;Breast compression;Adjust position;Support pillows;Position options;Expressed milk;DEBP;Education;Pace feeding;LC Psychologist, educational;Infant Driven Feeding Algorithm education  Discharge Pump: Manual WIC Program: Yes (Mom preference to get formula from [name]. Mom getting electric pump for home from a program in [place] through a counselor she is in contact with.)  Consult Status Consult Status: Follow-up Date: 08/27/21 Follow-up type: In-patient    Tashya Alberty  Nicholson-Springer 08/26/2021, 6:54 PM

## 2021-08-26 NOTE — Discharge Summary (Signed)
Postpartum Discharge Summary  Patient Name: Roberta Baker DOB: [birth year] MRN: [id number]  Date of admission: 08/26/2021 Delivery date:08/26/2021  Delivering provider: Genia Del  Date of discharge: 08/27/2021  Admitting diagnosis: Indication for care in labor or delivery [O75.9] Intrauterine pregnancy: [redacted]w[redacted]d    Secondary diagnosis:  Principal Problem:   Vaginal delivery Active Problems:   HSV infection   Depression with anxiety   Marijuana use   Smoker  Additional problems: None    Discharge diagnosis: Term Pregnancy Delivered                                              Post partum procedures: None Augmentation:  None Complications: None  Hospital course: Onset of Labor With Vaginal Delivery      35y.o. yo G2P1001 at 390w5das admitted in Active Labor on 08/26/2021. Patient had an uncomplicated labor course as follows:  Membrane Rupture Time/Date: 1:16 PM ,08/26/2021   Delivery Method:Vaginal, Spontaneous  Episiotomy: None  Lacerations:  Labial  Patient had an uncomplicated postpartum course.  She is ambulating, tolerating a regular diet, passing flatus, and urinating well. Patient is discharged home in stable condition on 08/27/21.  Newborn Data: Birth date:08/26/2021  Birth time:1:21 PM  Gender:Female  Living status:Living  Apgars:7 ,9  Weight:2700 g   Magnesium Sulfate received: No BMZ received: No Rhophylac: N/A MMR: N/A T-DaP: Offered postpartum  Flu: No Transfusion: No   Physical exam  Vitals:   08/26/21 1645 08/26/21 1947 08/27/21 0000 08/27/21 0500  BP: (!) 95/53 120/70 125/77 128/88  Pulse: 82 88 78 80  Resp: _0 Temp: 98.2 F (36.8 C) 98.1 F (36.7 C) 98.4 F (36.9 C) 98.4 F (36.9 C)  TempSrc: Oral Oral Oral Oral  SpO2: 98% 98% 99% 99%   General: alert Lochia: appropriate Uterine Fundus: firm Incision: N/A DVT Evaluation: No evidence of DVT seen on physical exam.  Labs: Lab Results  Component Value  Date   WBC 26.9 (H) 08/26/2021   HGB 11.8 (L) 08/26/2021   HCT 34.9 (L) 08/26/2021   MCV 86.6 08/26/2021   PLT 481 (H) 08/26/2021   CMP Latest Ref Rng & Units 10/10/2020  Glucose 70 - 99 mg/dL 92  BUN 6 - 20 mg/dL <3(L)  Creatinine 0.44 - 1.00 mg/dL 0.40(L)  Sodium 135 - 145 mmol/L 142  Potassium 3.5 - 5.1 mmol/L 3.1(L)  Chloride 98 - 111 mmol/L 105  CO2 22 - 32 mmol/L -  Calcium 8.9 - 10.3 mg/dL -  Total Protein 6.5 - 8.1 g/dL -  Total Bilirubin 0.3 - 1.2 mg/dL -  Alkaline Phos 38 - 126 U/L -  AST 15 - 41 U/L -  ALT 0 - 44 U/L -   Edinburgh Score: Edinburgh Postnatal Depression Scale Screening Tool 08/27/2021  I have been able to laugh and see the funny side of things. (No Data)     After visit meds:  Allergies as of 08/27/2021       Reactions   Sulfa Antibiotics Nausea And Vomiting        Medication List     STOP taking these medications    acyclovir 400 MG tablet Commonly known as: ZOVIRAX   cephALEXin 500 MG capsule Commonly known as: KEFLEX   diphenhydrAMINE 25 MG tablet Commonly known as: BENADRYL   Doxylamine-Pyridoxine  10-10 MG Tbec Commonly known as: Diclegis   FLINSTONES GUMMIES OMEGA-3 DHA PO   promethazine 25 MG tablet Commonly known as: PHENERGAN       TAKE these medications    Blood Pressure Monitor Misc For regular home bp monitoring during pregnancy   ferrous sulfate 325 (65 FE) MG tablet Take 1 tablet (325 mg total) by mouth every other day.         Discharge home in stable condition Infant Feeding: Breast Infant Disposition:home with mother Discharge instruction: per After Visit Summary and Postpartum booklet. Activity: Advance as tolerated. Pelvic rest for 6 weeks.  Diet: routine diet Future Appointments: Future Appointments  Date Time Provider [place]  09/01/2021  9:10 AM Luvenia Redden, PA-C [facility]   Follow up Visit: Message sent to [facility] by Dr. Gwenlyn Perking on 08/26/21.   Please schedule  this patient for a In person postpartum visit in 6 weeks with the following provider: Any provider. Additional Postpartum F/U: Postpartum Depression checkup  Low risk pregnancy complicated by:  Marijuana/tobacco use Delivery mode:  Vaginal, Spontaneous  Anticipated Birth Control:  Depo (will get prior to discharge)  Renard Matter, MD, MPH OB Fellow, Faculty Practice

## 2021-08-26 NOTE — H&P (Signed)
OBSTETRIC ADMISSION HISTORY AND PHYSICAL  Roberta Baker is a 35 y.o. female G2P1001 with IUP at [redacted]w[redacted]d by LMP presenting for SOL. She reports +FMs, no LOF, no VB, no blurry vision, headaches, peripheral edema, or RUQ pain.  She plans on breast feeding. She requests Depo for birth control postpartum.  She received her prenatal care at [name].  Dating: By LMP --->  Estimated Date of Delivery: 09/04/21  Sono:   @[redacted]w[redacted]d , CWD, normal anatomy, cephalic presentation, posterior placental lie, 2799 g, 24% EFW  Prenatal History/Complications:  Genital HSV  Marijuana use Anxiety and depression  Tobacco use Asymptomatic bacteruria Marginal placenta previa (resolved)  Past Medical History: Past Medical History:  Diagnosis Date   Anxiety    Depression    Headache    Infection    UTI   Kidney infection    Kidney stones    Nausea and vomiting in pregnancy 02/07/2017   Pain, dental 02/07/2017   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    Past Surgical History: Past Surgical History:  Procedure Laterality Date   BREAST ENHANCEMENT SURGERY     BREAST SURGERY      Obstetrical History: OB History     Gravida  2   Para  1   Term  1   Preterm      AB  0   Living  1      SAB      IAB      Ectopic      Multiple  0   Live Births              Social History Social History   Socioeconomic History   Marital status: Single    Spouse name: Not on file   Number of children: 1   Years of education: Not on file   Highest education level: Not on file  Occupational History   Not on file  Tobacco Use   Smoking status: Every Day    Packs/day: 0.50    Years: 0.50    Pack years: 0.25    Types: Cigarettes   Smokeless tobacco: Never  Vaping Use   Vaping Use: Never used  Substance and Sexual Activity   Alcohol use: No   Drug use: Not Currently    Types: Marijuana    Comment: occasional   Sexual activity: Yes    Birth control/protection: None  Other  Topics Concern   Not on file  Social History Narrative   Not on file   Social Determinants of Health   Financial Resource Strain: Unknown   Difficulty of Paying Living Expenses: Patient refused  Food Insecurity: Food Insecurity Present   Worried About 04/09/2017 in the Last Year: Often true   Programme researcher, broadcasting/film/video in the Last Year: Sometimes true  Transportation Needs: No Transportation Needs   Lack of Transportation (Medical): No   Lack of Transportation (Non-Medical): No  Physical Activity: Sufficiently Active   Days of Exercise per Week: 7 days   Minutes of Exercise per Session: 100 min  Stress: Stress Concern Present   Feeling of Stress : Very much  Social Connections: Moderately Isolated   Frequency of Communication with Friends and Family: More than three times a week   Frequency of Social Gatherings with Friends and Family: More than three times a week   Attends Religious Services: Never   Barista or Organizations: No   Attends Club or  Organization Meetings: Never   Marital Status: Living with partner    Family History: Family History  Problem Relation Age of Onset   Mental illness Mother    Seizures Mother    Cancer Maternal Uncle    Cancer Maternal Grandmother        breast cancer   Heart attack Maternal Grandfather     Allergies: Allergies  Allergen Reactions   Sulfa Antibiotics Nausea And Vomiting    Medications Prior to Admission  Medication Sig Dispense Refill Last Dose   ferrous sulfate 325 (65 FE) MG tablet Take 1 tablet (325 mg total) by mouth every other day. (Patient not taking: Reported on 06/30/2021) 45 tablet 2    acyclovir (ZOVIRAX) 400 MG tablet Take 1 tablet (400 mg total) by mouth 3 (three) times daily. 90 tablet 3    Blood Pressure Monitor MISC For regular home bp monitoring during pregnancy 1 each 0    cephALEXin (KEFLEX) 500 MG capsule Take 1 capsule (500 mg total) by mouth 4 (four) times daily. X 7 days (Patient not  taking: Reported on 08/16/2021) 28 capsule 0    diphenhydrAMINE HCl (BENADRYL PO) Take by mouth.      Doxylamine-Pyridoxine (DICLEGIS) 10-10 MG TBEC 2 tabs q hs, if sx persist add 1 tab q am on day 3, if sx persist add 1 tab q afternoon on day 4 (Patient not taking: Reported on 08/16/2021) 100 tablet 6    hydrOXYzine (VISTARIL) 25 MG capsule Take 1 capsule (25 mg total) by mouth 2 (two) times daily as needed for anxiety. (Patient not taking: Reported on 06/30/2021) 60 capsule 3    promethazine (PHENERGAN) 25 MG tablet TAKE 1 TABLET(25 MG) BY MOUTH EVERY 6 HOURS AS NEEDED FOR NAUSEA OR VOMITING 30 tablet 2      Review of Systems  All systems reviewed and negative except as stated in HPI  Blood pressure 105/71, pulse 96, temperature 98.2 F (36.8 C), temperature source Oral, resp. rate 20, last menstrual period 11/28/2020, SpO2 98 %.  General appearance: alert, cooperative, and no distress Lungs: normal work of breathing on  room air  Heart: normal rate, warm and well perfused  Abdomen: soft, non-tender, gravid  Pelvic: normal external genitalia and vaginal mucosa without lesions noted  Extremities: no LE edema or calf tenderness to palpation   Presentation:  Cephalic Fetal monitoring: Baseline 135 bpm, moderate variability, + accels, no decels  Uterine activity: Contractions every 2-3 minutes  Dilation: 10 Effacement (%): 100 Station: Plus 1 Exam by:: Dr. Mathis Fare   Prenatal labs: ABO, Rh: A/Positive/-- (06/28 1044) Antibody: Negative (09/30 0807) Rubella: 1.33 (06/28 1044) RPR: Non Reactive (09/30 0807)  HBsAg: Negative (06/28 1044)  HIV: Non Reactive (09/30 0807)  GBS: Negative/-- (12/07 1618)  2 hr Glucola normal  Genetic screening - LR NIPS, Horizon neg Anatomy US normal   Prenatal Transfer Tool  Maternal Diabetes: No Genetic Screening: Normal Maternal Ultrasounds/Referrals: Normal Fetal Ultrasounds or other Referrals:  None Maternal Substance Abuse:  Yes:  Type:  Smoker, Marijuana Significant Maternal Medications:  None Significant Maternal Lab Results: Group B Strep negative  No results found for this or any previous visit (from the past 24 hour(s)).  Patient Active Problem List   Diagnosis Date Noted   Asymptomatic bacteriuria during pregnancy in second trimester 06/12/2021   Marginal placenta previa 04/04/2021   Abnormal Pap smear of cervix 03/10/2021   Encounter for supervision of normal pregnancy, antepartum 02/28/2021   Smoker 02/28/2021  History of prior pregnancy with IUGR newborn 03/14/2017   Marijuana use 03/13/2017   HSV infection 12/12/2016   Depression with anxiety 12/12/2016    Assessment/Plan:  KELLEY POLINSKY is a 35 y.o. G2P1001 at [redacted]w[redacted]d here for SOL.   #Labor: Upon arrival to bedside, patient had spontaneous rupture of membranes. Able to see fetal head with contractions. Anticipate SVD shortly.  #Pain: Maternal support  #FWB: Cat 1  #ID:  GBS neg #MOF: Breast  #MOC: Depo   #Genital HSV: No lesions noted on external genitalia or vagina. No recent outbreaks. On suppressive therapy prior to arrival.   #Hx of marijuana/tobacco use: Denies recent use. UDS ordered on admission. Plan for social work consult postpartum.   Worthy Rancher, MD  08/26/2021, 12:59 PM

## 2021-08-27 LAB — RPR: RPR Ser Ql: NONREACTIVE

## 2021-08-27 NOTE — Clinical Social Work Maternal (Signed)
CLINICAL SOCIAL WORK MATERNAL/CHILD NOTE  Patient Details  Name: Roberta Baker MRN: [id number] Date of Birth: 07-Oct-1985  Date:  08/27/2021  Clinical Social Worker Initiating Note:  Darcus Austin, MSW, LCSWA Date/Time: Initiated:  08/27/21/1522     Child's Name:  Jerelyn Scott   Biological Parents:  Mother, Father   Need for Interpreter:  None   Reason for Referral:  Behavioral Health Concerns, Current Substance Use/Substance Use During Pregnancy     Address:  [address] [place] [address]    Phone number:  [phone] (home)     Additional phone number:   Household Members/Support Persons (HM/SP):   Household Member/Support Person 1, Household Member/Support Person 2   HM/SP Name Relationship DOB or Age  HM/SP -Opal Son 04/17/17  HM/SP -3        HM/SP -4        HM/SP -5        HM/SP -6        HM/SP -7        HM/SP -8          Natural Supports (not living in the home):  Parent   Professional Supports: None   Employment: Full-time   Type of Work: Agricultural consultant   Education:  [place] arranged:    Museum/gallery curator Resources:  [employer]   Other Resources:  [employer], Physicist, medical     Cultural/Religious Considerations Which May Impact Care:    Strengths:  Ability to meet basic needs  , Pediatrician chosen   Psychotropic Medications:         Pediatrician:    [place] (including Technical sales engineer and surronding areas)  Pediatrician List:   [place] Other ([facility])  [facility]      Pediatrician Fax Number:    Risk Factors/Current Problems:  Substance Use  , Mental Health Concerns     Cognitive State:  Able to Concentrate  , Alert  , Insightful  , Linear Thinking  , Goal Oriented     Mood/Affect:  Comfortable  , Interested  , Calm  , Happy  , Bright  , Relaxed     CSW Assessment: CSW met with [name]  to complete consult for mental health, and substance use during pregnancy. CSW observed [name] resting in bed bonding with infant. CSW explained role, and reason for consult. [name] was pleasant, and polite during engagement with CSW. [name] reported, history of anxiety, and depression while in college (2007). [name] reported, symptoms such as over thinking, lack of appetite, and insomnia. [name] reported, history of Adderall, Klonopin, and Vistaril. [name] reported, her last dose of Klonopin was ten years ago. [name] reported, she was prescribe Vistaril during pregnancy, and discard after a week. [name] reported, she has able to manage symptoms without medication. However, if she could find a doctor in [place] to prescribe Klonopin. [name] reported, she is open to resuming medication. CSW encourage [name] to implement healthy coping skills when symptoms arises.   [name] reported, history of THC, and her last use was two weeks ago. [name] reported, her duration of use has been since college. [name] reported, her reason for use was for nausea. [name] denied any additional illicit substances. [name] reported, history of CPS with previous child due to [facility] use (2018). CSW inform [name] of drug  screen policy, and [name] was understanding of protocol. CSW made CPS report to [facility] with Esaw Dace. Per [place], CPS there are barriers to d/c until cleared from CPS.     CSW provided education regarding the baby blues period vs. perinatal mood disorders, discussed treatment and gave resources for mental health follow up if concerns arise. CSW recommends self- evaluation during the postpartum time period using the New Mom Checklist from Postpartum Progress and encouraged [name] to contact a medical professional if symptoms are noted at any time.   [name] reported, since delivery she feels, "good". [name] reported, her mother is very supportive. [name] denied SI, HI, and DV when CSW assessed for safety.   [name] reported, she receives [facility], and food stamps. [name]  reported, infant's pediatrician will be at [name]. [name] reported, there are no transportation barriers to follow up infant's care. [name] reported, she has all essentials needed to care for infant. [name] reported, infant has a car seat, and bassinet. [name] denied any additional barriers.     CSW provided education on sudden infant death syndrome (SIDS).  CSW provided perinatal mood disorders resources.   CSW made CPS report to [facility] with Esaw Dace. Per [place], CPS there are barriers to d/c until cleared from CPS.     Esaw Dace # [phone]  CSW Plan/Description:  Sudden Infant Death Syndrome (SIDS) Education, Perinatal Mood and Anxiety Disorder (PMADs) Education, CSW Will Continue to Monitor Umbilical Cord Tissue Drug Screen Results and Make Report if Warranted, Child Protective Service Report  , [place], MSW, LCSW-A Clinical Social Worker- Weekends [phone]   Darcus Austin, Latanya Presser 08/27/2021, 3:27 PM

## 2021-08-30 ENCOUNTER — Encounter: Payer: Medicaid Other | Admitting: Advanced Practice Midwife

## 2021-09-01 ENCOUNTER — Encounter: Payer: Medicaid Other | Admitting: Medical

## 2021-09-05 ENCOUNTER — Ambulatory Visit: Payer: Medicaid Other | Admitting: Adult Health

## 2021-09-07 ENCOUNTER — Telehealth (HOSPITAL_COMMUNITY): Payer: Self-pay

## 2021-09-07 ENCOUNTER — Ambulatory Visit: Payer: Medicaid Other | Admitting: Adult Health

## 2021-09-07 ENCOUNTER — Telehealth: Payer: Self-pay | Admitting: Clinical

## 2021-09-07 DIAGNOSIS — Z1331 Encounter for screening for depression: Secondary | ICD-10-CM

## 2021-09-07 NOTE — Telephone Encounter (Signed)
No answer. Mailbox is full.   EPDS score on 08/27/21 was 15. Referral placed for integrated behavioral health.   Marcelino Duster [facility] 09/07/2021,1609

## 2021-09-07 NOTE — Telephone Encounter (Signed)
Attempt to schedule, per referral: Unable to leave voicemail as mailbox is full; left MyChart message for pt. 

## 2021-09-08 ENCOUNTER — Telehealth (HOSPITAL_COMMUNITY): Payer: Self-pay | Admitting: *Deleted

## 2021-09-08 ENCOUNTER — Encounter: Payer: Self-pay | Admitting: *Deleted

## 2021-09-08 NOTE — Telephone Encounter (Signed)
Voice mailbox full. Unable to leave message.  Duffy Rhody, RN 09-08-2021 at 9:10am

## 2021-09-12 ENCOUNTER — Ambulatory Visit (INDEPENDENT_AMBULATORY_CARE_PROVIDER_SITE_OTHER): Payer: Medicaid Other | Admitting: Women's Health

## 2021-09-12 ENCOUNTER — Other Ambulatory Visit: Payer: Self-pay

## 2021-09-12 ENCOUNTER — Encounter: Payer: Self-pay | Admitting: Women's Health

## 2021-09-12 VITALS — BP 125/74 | HR 70 | Ht 60.0 in | Wt 124.0 lb

## 2021-09-12 DIAGNOSIS — F418 Other specified anxiety disorders: Secondary | ICD-10-CM

## 2021-09-12 NOTE — Progress Notes (Signed)
° °  GYN VISIT Patient name: Roberta Baker MRN [id number]  Date of birth: 1986-07-08 Chief Complaint:   Postpartum Care and Depression  History of Present Illness:   Roberta Baker is a 36 y.o. G54P2002 Caucasian female 2wks s/p SVB being seen today for mood check. States literally as soon as baby came out she feels everything got better! Denies SI/HI/II. Not on meds. EPDS was 15 (on 12/25), now 7. Got depo in hospital. Bottlefeeding. Has some problems making herself go to sleep- b/c too much to do. Discussed time management, letting go of small things. Gets chills (no fever), just feels cold. Did stop smoking 1wk ago, so not sure if related to that, offered cbc/tsh, declines.  No LMP recorded.  Depression screen [facility] 2/9 06/02/2021 02/28/2021 06/07/2017 12/12/2016  Decreased Interest 2 1 0 0  Down, Depressed, Hopeless 3 3 0 1  PHQ - 2 Score 5 4 0 1  Altered sleeping 1 2 0 1  Tired, decreased energy 2 3 0 1  Change in appetite 0 2 0 -  Feeling bad or failure about yourself  3 3 0 0  Trouble concentrating 2 1 0 0  Moving slowly or fidgety/restless 0 0 0 0  Suicidal thoughts 0 0 0 0  PHQ-9 Score 13 15 0 3  Difficult doing work/chores - - Not difficult at all -     GAD 7 : Generalized Anxiety Score 02/28/2021  Nervous, Anxious, on Edge 3  Control/stop worrying 3  Worry too much - different things 3  Trouble relaxing 3  Restless 2  Easily annoyed or irritable 3  Afraid - awful might happen 3  Total GAD 7 Score 20     Review of Systems:   Pertinent items are noted in HPI Denies fever/chills, dizziness, headaches, visual disturbances, fatigue, shortness of breath, chest pain, abdominal pain, vomiting, abnormal vaginal discharge/itching/odor/irritation, problems with periods, bowel movements, urination, or intercourse unless otherwise stated above.  Pertinent History Reviewed:  Reviewed past medical,surgical, social, obstetrical and family history.  Reviewed problem list,  medications and allergies. Physical Assessment:   Vitals:   09/12/21 1432  BP: 125/74  Pulse: 70  Weight: 124 lb (56.2 kg)  Height: 5' (1.524 m)  Body mass index is 24.22 kg/m.       Physical Examination:   General appearance: alert, well appearing, and in no distress  Mental status: alert, oriented to person, place, and time  Skin: warm & dry   Cardiovascular: normal heart rate noted  Respiratory: normal respiratory effort, no distress  Abdomen: soft, non-tender   Pelvic: examination not indicated  Extremities: no edema   Chaperone: N/A    No results found for this or any previous visit (from the past 24 hour(s)).  Assessment & Plan:  1) 2wk s/p SVB> bottlefeeding  2) Mood check d/t h/o dep/anx> no meds, doing great  Meds: No orders of the defined types were placed in this encounter.   No orders of the defined types were placed in this encounter.   Return for As scheduled for pp visit.  [place], [facility] 09/12/2021 2:59 PM

## 2021-09-26 ENCOUNTER — Other Ambulatory Visit: Payer: Self-pay

## 2021-09-26 ENCOUNTER — Encounter: Payer: Self-pay | Admitting: Women's Health

## 2021-09-26 ENCOUNTER — Ambulatory Visit (INDEPENDENT_AMBULATORY_CARE_PROVIDER_SITE_OTHER): Payer: Medicaid Other | Admitting: Women's Health

## 2021-09-26 ENCOUNTER — Ambulatory Visit: Payer: Medicaid Other | Admitting: Women's Health

## 2021-09-26 DIAGNOSIS — R110 Nausea: Secondary | ICD-10-CM

## 2021-09-26 DIAGNOSIS — F419 Anxiety disorder, unspecified: Secondary | ICD-10-CM

## 2021-09-26 MED ORDER — MEDROXYPROGESTERONE ACETATE 150 MG/ML IM SUSP: 150.0000 mg | INTRAMUSCULAR | 3 refills | Status: DC

## 2021-09-26 MED ORDER — BUSPIRONE HCL 5 MG PO TABS: 5.0000 mg | ORAL_TABLET | Freq: Two times a day (BID) | ORAL | 3 refills | Status: DC

## 2021-09-26 MED ORDER — PROMETHAZINE HCL 25 MG PO TABS
12.5000 mg | ORAL_TABLET | Freq: Four times a day (QID) | ORAL | 0 refills | Status: DC | PRN
Start: 2021-09-26 — End: 2021-10-24

## 2021-09-26 NOTE — Progress Notes (Signed)
POSTPARTUM VISIT Patient name: Roberta Baker MRN [id number]  Date of birth: June 19, 1986 Chief Complaint:   Postpartum Care  History of Present Illness:   Roberta Baker is a 36 y.o. G87P2002 Caucasian female being seen today for a postpartum visit. She is 4 weeks postpartum following a spontaneous vaginal delivery at 38.5 gestational weeks. IOL: no, for n/a. Anesthesia: none.  Laceration: labial, not repaired.  Complications: none. Inpatient contraception: yes depo on 12/25 .   Pregnancy uncomplicated. Tobacco use: yes. Substance use disorder: no. 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 Colpo w/ +ACW w/o bx during pregnancy, needs colpo 8wks pp  No LMP recorded. Patient has had an injection.  Postpartum course has been uncomplicated. Reports bump Rt labia she noticed, no pain. Bleeding less flow than a normal period. Bowel function is normal. Bladder function is normal. Urinary incontinence? no, fecal incontinence? no Patient is not sexually active. Last sexual activity: prior to birth of baby. Desired contraception: PP Depo given. Patient does not want a pregnancy in the future.  Desired family size is 2 children.   Upstream - 09/26/21 1604       Pregnancy Intention Screening   Does the patient want to become pregnant in the next year? No    Does the patient's partner want to become pregnant in the next year? No    Would the patient like to discuss contraceptive options today? No      Contraception Wrap Up   Current Method Hormonal Injection    End Method Hormonal Injection    Contraception Counseling Provided No            The pregnancy intention screening data noted above was reviewed. Potential methods of contraception were discussed. The patient elected to proceed with Hormonal Injection.  Edinburgh Postpartum Depression Screening: negative, no PPD, does report lots of anxiety, requests med. Also nausea/vomiting when she gets super  anxious, requests nausea med. Denies depression, SI/HI/II  Edinburgh Postnatal Depression Scale - 09/26/21 1604       Edinburgh Postnatal Depression Scale:  In the Past 7 Days   I have been able to laugh and see the funny side of things. 0    I have looked forward with enjoyment to things. 0    I have blamed myself unnecessarily when things went wrong. 2    I have been anxious or worried for no good reason. 2    I have felt scared or panicky for no good reason. 0    Things have been getting on top of me. 1    I have been so unhappy that I have had difficulty sleeping. 1    I have felt sad or miserable. 1    I have been so unhappy that I have been crying. 0    The thought of harming myself has occurred to me. 0    Edinburgh Postnatal Depression Scale Total 7             GAD 7 : Generalized Anxiety Score 02/28/2021  Nervous, Anxious, on Edge 3  Control/stop worrying 3  Worry too much - different things 3  Trouble relaxing 3  Restless 2  Easily annoyed or irritable 3  Afraid - awful might happen 3  Total GAD 7 Score 20     Baby's course has been uncomplicated. Baby is feeding by bottle. Infant has a pediatrician/family doctor? Yes.  Childcare strategy if returning to work/school: family.  Pt  has material needs met for her and baby: Yes.   Review of Systems:   Pertinent items are noted in HPI Denies Abnormal vaginal discharge w/ itching/odor/irritation, headaches, visual changes, shortness of breath, chest pain, abdominal pain, severe nausea/vomiting, or problems with urination or bowel movements. Pertinent History Reviewed:  Reviewed past medical,surgical, obstetrical and family history.  Reviewed problem list, medications and allergies. OB History  Gravida Para Term Preterm AB Living  2 2 2    0 2  SAB IAB Ectopic Multiple Live Births        0 2    # Outcome Date GA Lbr Len/2nd Weight Sex Delivery Anes PTL Lv  2 Term 04/17/17 52w1d12:26 / 00:18 6 lb 1.4 oz (2.761 kg) M  Vag-Vacuum EPI  LIV  1 Term            Physical Assessment:   Vitals:   09/26/21 1558  BP: 116/83  Pulse: 89  Weight: 117 lb (53.1 kg)  Height: 5' (1.524 m)  Body mass index is 22.85 kg/m.       Physical Examination:   General appearance: alert, well appearing, and in no distress  Mental status: alert, oriented to person, place, and time  Skin: warm & dry   Cardiovascular: normal heart rate noted   Respiratory: normal respiratory effort, no distress   Breasts: deferred, no complaints   Abdomen: soft, non-tender   Pelvic: possible small subdermal cyst Rt labia, doesn't appear to be boil, no s/s infection, soft & nontender. Thin prep pap obtained: No  Rectal: not examined  Extremities: Edema: none   Chaperone: ACelene Squibb        No results found for this or any previous visit (from the past 24 hour(s)).  Assessment & Plan:  1) Postpartum exam 2) 4 wks s/p spontaneous vaginal delivery 3) bottle feeding 4) Depression screening 5) Contraception s/p depo 12/25, rx sent, make appt for next dose today 6) Anxiety> rx buspar, f/u 4wks 7) N/V> r/t anxiety, rx phenergan, hopefully will improve once buspar starts helping 8) ?subdermal cyst Rt labia> monitor, if gets bigger/firm/painful, let uKoreaknow 9) Abnormal pap/colpo> needs repeat colpo in 4wks  Essential components of care per ACOG recommendations:  1.  Mood and well being:  If positive depression screen, discussed and plan developed.  If using tobacco we discussed reduction/cessation and risk of relapse If current substance abuse, we discussed and referral to local resources was offered.   2. Infant care and feeding:  If breastfeeding, discussed returning to work, pumping, breastfeeding-associated pain, guidance regarding return to fertility while lactating if not using another method. If needed, patient was provided with a letter to be allowed to pump q 2-3hrs to support lactation in a private location with access to a  refrigerator to store breastmilk.   Recommended that all caregivers be immunized for flu, pertussis and other preventable communicable diseases If pt does not have material needs met for her/baby, referred to local resources for help obtaining these.  3. Sexuality, contraception and birth spacing Provided guidance regarding sexuality, management of dyspareunia, and resumption of intercourse Discussed avoiding interpregnancy interval <63ms and recommended birth spacing of 18 months  4. Sleep and fatigue Discussed coping options for fatigue and sleep disruption Encouraged family/partner/community support of 4 hrs of uninterrupted sleep to help with mood and fatigue  5. Physical recovery  If pt had a C/S, assessed incisional pain and providing guidance on normal vs prolonged recovery If pt had a laceration, perineal  healing and pain reviewed.  If urinary or fecal incontinence, discussed management and referred to PT or uro/gyn if indicated  Patient is safe to resume physical activity. Discussed attainment of healthy weight.  6.  Chronic disease management Discussed pregnancy complications if any, and their implications for future childbearing and long-term maternal health. Review recommendations for prevention of recurrent pregnancy complications, such as 17 hydroxyprogesterone caproate to reduce risk for recurrent PTB not applicable, or aspirin to reduce risk of preeclampsia not applicable. Pt had GDM: no. If yes, 2hr GTT scheduled: not applicable. Reviewed medications and non-pregnant dosing including consideration of whether pt is breastfeeding using a reliable resource such as LactMed: not applicable Referred for f/u w/ PCP or subspecialist providers as indicated: not applicable  7. Health maintenance Mammogram at 36yo or earlier if indicated Pap smears as indicated  Meds:  Meds ordered this encounter  Medications   medroxyPROGESTERone (DEPO-PROVERA) 150 MG/ML injection    Sig:  Inject 1 mL (150 mg total) into the muscle every 3 (three) months.    Dispense:  1 mL    Refill:  3    Order Specific Question:   Supervising Provider    Answer:   Elonda Husky, LUTHER H [[id number]]   busPIRone (BUSPAR) 5 MG tablet    Sig: Take 1 tablet (5 mg total) by mouth 2 (two) times daily.    Dispense:  60 tablet    Refill:  3    Order Specific Question:   Supervising Provider    Answer:   Tania Ade H [[id number]]   promethazine (PHENERGAN) 25 MG tablet    Sig: Take 0.5-1 tablets (12.5-25 mg total) by mouth every 6 (six) hours as needed for nausea or vomiting.    Dispense:  30 tablet    Refill:  0    Order Specific Question:   Supervising Provider    Answer:   Florian Buff [[id number]]    Follow-up: Return for 4wks for colpo and med f/u; schedule next depo (last dose 12/25).   No orders of the defined types were placed in this encounter.   [place], [facility] 09/26/2021 4:51 PM

## 2021-10-24 ENCOUNTER — Ambulatory Visit (INDEPENDENT_AMBULATORY_CARE_PROVIDER_SITE_OTHER): Payer: Medicaid Other | Admitting: Women's Health

## 2021-10-24 ENCOUNTER — Encounter: Payer: Self-pay | Admitting: Women's Health

## 2021-10-24 ENCOUNTER — Other Ambulatory Visit (HOSPITAL_COMMUNITY)
Admission: RE | Admit: 2021-10-24 | Discharge: 2021-10-24 | Disposition: A | Discharge status: Home / Self Care | Payer: Medicaid Other | Source: Ambulatory Visit | Attending: Women's Health | Admitting: Women's Health

## 2021-10-24 ENCOUNTER — Other Ambulatory Visit: Payer: Self-pay

## 2021-10-24 VITALS — BP 123/84 | HR 119 | Ht 60.0 in | Wt 122.0 lb

## 2021-10-24 DIAGNOSIS — F419 Anxiety disorder, unspecified: Secondary | ICD-10-CM

## 2021-10-24 DIAGNOSIS — R87613 High grade squamous intraepithelial lesion on cytologic smear of cervix (HGSIL): Secondary | ICD-10-CM

## 2021-10-24 DIAGNOSIS — Z01812 Encounter for preprocedural laboratory examination: Secondary | ICD-10-CM

## 2021-10-24 DIAGNOSIS — Z3202 Encounter for pregnancy test, result negative: Secondary | ICD-10-CM | POA: Diagnosis not present

## 2021-10-24 LAB — POCT URINE PREGNANCY: Preg Test, Ur: NEGATIVE

## 2021-10-24 MED ORDER — PROMETHAZINE HCL 25 MG PO TABS: 12.5000 mg | ORAL_TABLET | Freq: Four times a day (QID) | ORAL | 6 refills | Status: DC | PRN

## 2021-10-24 NOTE — Patient Instructions (Addendum)
[facility]  [address] [place], [place] [address] Phone: [phone] Hours: Mon-Fri 8AM-5PM  If you're a walk-in patient there is generally a wait time involved on a "first come, first served basis" otherwise contact us beforehand to setup an appointment. If you're in crisis please use our 24-Hour Crisis Hotline for immediate access to a clinician. 24 Hour Crisis Line: [phone]   If this is your first time please bring the following with you: Insurance/Medicaid Card ID or Social Security Card Any referral documentation  Payment Options Individuals with Medicaid have no obligation for a copay. Individuals with Medicare or private insurance will be obligated to meet their policy's requirement(s). Individuals who are uninsured will be eligible for a sliding or discounted scaled as defined by the relevant Managed Care Organization/Local Management Entity  Services:  Substance Abuse Outpatient Treatment Mental Health Outpatient Treatment Psychiatric Services/ Medical Services Advanced Access/Walk-In Clinic Mobile Crisis Management Services ACTT (Assertive Community Treatment Team) Dialectic Behavioral Therapy Critical Time Intervention Psychosocial Rehabilitation [facility]) Medication Assisted Treatment   Primary Care Providers Dr. Dwana Melena [place]) [phone] [facility] Primary Care [phone] [name] [place]) [phone] [name] [place]) [phone] [facility] [place]) [phone]) [phone] [facility] [phone] [facility] [phone]  Colposcopy, Care After The following information offers guidance on how to care for yourself after your procedure. Your health care provider may also give you more specific instructions. If you have problems or questions, contact your health care provider. What can I expect after the procedure? If you had a colposcopy without a  biopsy, you can expect to feel fine right away after your procedure. However, you may have some spotting of blood for a few days. You can return to your normal activities. If you had a colposcopy with a biopsy, it is common after the procedure to have: Soreness and mild pain. These may last for a few days. Mild vaginal bleeding or discharge that is dark-colored and grainy. This may last for a few days. The discharge may be caused by a liquid (solution) that was used during the procedure. You may need to wear a sanitary pad during this time. Spotting of blood for at least 48 hours after the procedure. Follow these instructions at home: Medicines Take over-the-counter and prescription medicines only as told by your health care provider. Talk with your health care provider about what type of over-the-counter pain medicines and prescription medicines you can start to take again. It is especially important to talk with your health care provider if you take blood thinners. Activity Avoid using douche products, using tampons, and having sex for at least 3 days after the procedure or for as long as told by your health care provider. Return to your normal activities as told by your health care provider. Ask your health care provider what activities are safe for you. General instructions Ask your health care provider if you may take baths, swim, or use a hot tub. You may take showers. If you use birth control (contraception), continue to use it. Keep all follow-up visits. This is important. Contact a health care provider if: You have a fever or chills. You faint or feel light-headed. Get help right away if: You have heavy bleeding from your vagina or pass blood clots. Heavy bleeding is bleeding that soaks through a sanitary pad in less than 1 hour. You have vaginal discharge that is abnormal, is yellow in color, or smells bad. This could be a sign of  infection. You have severe pain or cramps in your  lower abdomen that do not go away with medicine. Summary If you had a colposcopy without a biopsy, you can expect to feel fine right away, but you may have some spotting of blood for a few days. You can return to your normal activities. If you had a colposcopy with a biopsy, it is common to have mild pain for a few days and spotting for 48 hours after the procedure. Avoid using douche products, using tampons, and having sex for at least 3 days after the procedure or for as long as told by your health care provider. Get help right away if you have heavy bleeding, severe pain, or signs of infection. This information is not intended to replace advice given to you by your health care provider. Make sure you discuss any questions you have with your health care provider. Document Revised: 01/15/2021 Document Reviewed: 01/15/2021 Elsevier Patient Education  2022 [employer].

## 2021-10-24 NOTE — Progress Notes (Signed)
° °  COLPOSCOPY PROCEDURE NOTE Patient name: Roberta Baker MRN [id number]  Date of birth: 29-Mar-1986 Subjective Findings:   Roberta Baker is a 36 y.o. G68P2002 Caucasian female s/p SVB being seen today for a colposcopy and f/u on buspar rx'd 1/24 for anxiety, doesn't feel it has helped much. Makes her nauseated, requests refill on phenergan. Used to being on klonopin, doesn't have current provider to rx. 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 Patient has had an injection. Contraception: Depo-Provera injections. Menopausal: no. Hysterectomy: no.   Smoker: yes. Immunocompromised: no.   The risks and benefits were explained and informed consent was obtained, and written copy is in chart. Pertinent History Reviewed:   Reviewed past medical,surgical, social, obstetrical and family history.  Reviewed problem list, medications and allergies. Objective Findings & Procedure:   Vitals:   10/24/21 0906  BP: 123/84  Pulse: (!) 119  Weight: 122 lb (55.3 kg)  Height: 5' (1.524 m)  Body mass index is 23.83 kg/m.  Results for orders placed or performed in visit on 10/24/21 (from the past 24 hour(s))  POCT urine pregnancy   Collection Time: 10/24/21  9:21 AM  Result Value Ref Range   Preg Test, Ur Negative Negative     Time out was performed.  Speculum placed in the vagina, cervix fully visualized. SCJ: fully visualized. Cervix swabbed x 3 with acetic acid.  Acetowhitening present: Yes Cervix: no visible lesions, no mosaicism, no punctation, no abnormal vasculature, and large acetowhite lesion(s) noted at 6, smaller @ 12 o'clock. Cervical biopsies taken at 6 & 12 o'clock and Hemostasis achieved with Monsel's solution. Vagina: vaginal colposcopy not performed Vulva: vulvar colposcopy not performed  Specimens:  2  Complications: none  Chaperone: Clint Bolder    Colposcopic Impression & Plan:   Colposcopy findings consistent with HSIL Plan: Post biopsy instructions given, Will notify patient of results when back, and Will base plan of care on pathology results and ASCCP guidelines  Anxiety> continue buspar for now, make appt at [name], gave printed info  Return in about 1 year (around 10/24/2022) for Pap & physical.  Cheral Marker CNM, [facility] 10/24/2021 10:19 AM

## 2021-10-25 ENCOUNTER — Telehealth: Payer: Self-pay | Admitting: Advanced Practice Midwife

## 2021-10-25 LAB — SURGICAL PATHOLOGY

## 2021-11-01 ENCOUNTER — Other Ambulatory Visit: Payer: Self-pay

## 2021-11-01 ENCOUNTER — Ambulatory Visit (INDEPENDENT_AMBULATORY_CARE_PROVIDER_SITE_OTHER): Payer: Medicaid Other

## 2021-11-01 DIAGNOSIS — Z3042 Encounter for surveillance of injectable contraceptive: Secondary | ICD-10-CM | POA: Diagnosis not present

## 2021-11-01 MED ORDER — MEDROXYPROGESTERONE ACETATE 150 MG/ML IM SUSP
150.0000 mg | Freq: Once | INTRAMUSCULAR | Status: AC
  Administered 2021-11-01: 150 mg via INTRAMUSCULAR

## 2021-11-01 NOTE — Progress Notes (Signed)
? ?  NURSE VISIT- INJECTION ? ?SUBJECTIVE:  ?Roberta Baker is a 35 y.o. G81P2002 female here for a Depo Provera for contraception/period management. She is a GYN patient.  ? ?OBJECTIVE:  ?There were no vitals taken for this visit.  ?Appears well, in no apparent distress ? ?Injection administered in: Left deltoid ? ?Meds ordered this encounter  ?Medications  ? medroxyPROGESTERone (DEPO-PROVERA) injection 150 mg  ? ? ?ASSESSMENT: ?GYN patient Depo Provera for contraception/period management ?PLAN: ?Follow-up: in 11-13 weeks for next Depo  ? ?Roberta Baker  ?11/01/2021 ?9:36 AM ? ?

## 2021-11-08 ENCOUNTER — Other Ambulatory Visit: Payer: Self-pay

## 2021-11-08 ENCOUNTER — Ambulatory Visit (INDEPENDENT_AMBULATORY_CARE_PROVIDER_SITE_OTHER): Payer: Medicaid Other | Admitting: Nurse Practitioner

## 2021-11-08 ENCOUNTER — Encounter: Payer: Self-pay | Admitting: Obstetrics & Gynecology

## 2021-11-08 ENCOUNTER — Ambulatory Visit: Payer: Medicaid Other | Admitting: Obstetrics & Gynecology

## 2021-11-08 ENCOUNTER — Encounter: Payer: Self-pay | Admitting: Nurse Practitioner

## 2021-11-08 VITALS — BP 141/73 | HR 119 | Temp 97.7°F | Ht 60.0 in | Wt 125.0 lb

## 2021-11-08 VITALS — BP 118/80 | HR 110 | Ht 60.0 in | Wt 124.6 lb

## 2021-11-08 DIAGNOSIS — N871 Moderate cervical dysplasia: Secondary | ICD-10-CM | POA: Diagnosis not present

## 2021-11-08 DIAGNOSIS — F418 Other specified anxiety disorders: Secondary | ICD-10-CM

## 2021-11-08 DIAGNOSIS — Z13220 Encounter for screening for lipoid disorders: Secondary | ICD-10-CM | POA: Diagnosis not present

## 2021-11-08 DIAGNOSIS — B977 Papillomavirus as the cause of diseases classified elsewhere: Secondary | ICD-10-CM | POA: Diagnosis not present

## 2021-11-08 DIAGNOSIS — Z3009 Encounter for other general counseling and advice on contraception: Secondary | ICD-10-CM

## 2021-11-08 DIAGNOSIS — Z7689 Persons encountering health services in other specified circumstances: Secondary | ICD-10-CM

## 2021-11-08 MED ORDER — ALPRAZOLAM 0.5 MG PO TABS: 0.5000 mg | ORAL_TABLET | Freq: Every day | ORAL | 0 refills | Status: DC | PRN

## 2021-11-08 MED ORDER — DULOXETINE HCL 30 MG PO CPEP: 30.0000 mg | ORAL_CAPSULE | Freq: Every day | ORAL | 0 refills | Status: DC

## 2021-11-08 NOTE — Progress Notes (Addendum)
? ?Subjective:  ? ? Patient ID: Roberta Baker, female    DOB: Nov 12, 1985, 36 y.o.   MRN: [id number] ? ?HPI ? ?36 year old female with history of anxiety and abnormal Pap smear is here to establish care and discuss anxiety. ? ? Patient states she was first diagnosed with anxiety when she was in college was once placed on Klonopin daily.  Patient states that she had to abruptly stop taking Klonopin due to insurance coverage in which she experienced a seizure.  Since seizure patient has been trying to manage anxiety on her own but lately feels like her anxious feelings are overwhelming.  Patient states that she finds it hard to function and states that she experienced a panic attack while at work.  Patient states that she does speak with a therapist over the phone but she is not sure if it is helping much.  Patient states that her upcoming LEEP procedure does cause a lot of anxiety.  Patient also admits that she feels alone here in [place] with her 2 children as her boyfriend's family have abandoned her and her boyfriend.  Patient states that she has 2 small children (to include a 77-monthold baby) and a very tight schedule where she works during the day and her boyfriend works at night.  Patient states that she feels like her mind is always racing, her appetite is low, and she feels jittery all the time.  Patient states that she just needs something to help her relax. ? ?Patient states that she has tried Zoloft but that is made her feel angry in the past.  Patient has also been on BuSpar but states that she developed hallucinations on them and has since stopped taking them. ? ?Patient denies any depressive symptoms.  Patient denies that she has thoughts of hurting herself or anyone else. ? ?Review of Systems  ?Psychiatric/Behavioral:    ?     Anxiety  ?All other systems reviewed and are negative. ? ?   ?Objective:  ? Physical Exam ?Constitutional:   ?   General: She is not in acute distress. ?   Appearance:  Normal appearance. She is normal weight. She is not ill-appearing, toxic-appearing or diaphoretic.  ?Cardiovascular:  ?   Rate and Rhythm: Regular rhythm. Tachycardia present.  ?   Pulses: Normal pulses.  ?   Heart sounds: Normal heart sounds. No murmur heard. ?Pulmonary:  ?   Effort: Pulmonary effort is normal. No respiratory distress.  ?   Breath sounds: Normal breath sounds. No stridor. No wheezing, rhonchi or rales.  ?Chest:  ?   Chest wall: No tenderness.  ?Neurological:  ?   Mental Status: She is alert.  ?Psychiatric:     ?   Attention and Perception: Attention and perception normal.     ?   Mood and Affect: Affect normal. Mood is anxious.     ?   Speech: Speech normal.     ?   Behavior: Behavior normal. Behavior is cooperative.     ?   Thought Content: Thought content normal.     ?   Cognition and Memory: Cognition and memory normal.     ?   Judgment: Judgment normal.  ? ? ? ? ? ?   ?Assessment & Plan:  ?1. Encounter to establish care ?-Follow-up in 2 weeks for evaluation of anxiety ? ?2. Depression with anxiety ?-Patient visibly anxious today. ?-Discussed SSRIs and SNRIs in detail.  Patient states that she needs something that will take  effect now to help her with her symptoms now.   ?-Offered Zoloft however patient states that she felt angry on Zoloft and she tried it before.  Offered Paxil however patient concerned about weight gain.  Fluoxetine considered however also concerned about side effect of agitation. ?-Decided to try duloxetine due to less side effects. ?-Will bridge patient with Xanax for approximately 4 weeks while waiting for duloxetine to take effect. ?-We will trial patient with duloxetine 30 mg daily and if tolerated well will increase to 60 mg in 2 weeks. ?-Discussed Xanax in detail.  Xanax or any other benzodiazepine is not a long-term medication.  Willing to only prescribed 4 weeks of Xanax to help patient with current anxiety symptoms that interfere with her daily activities.  Xanax  side effects discussed in detail.  Withdrawal symptoms discussed in detail. ?-Encouraged patient to reach out to her therapist to see if they do cognitive behavioral therapy.  Discussed with patient the benefit of cognitive behavioral therapy when paired with SSRI or SNRI ?- Ambulatory referral to Psychiatry ?- ALPRAZolam (XANAX) 0.5 MG tablet; Take 1 tablet (0.5 mg total) by mouth daily as needed for anxiety.  Dispense: 30 tablet; Refill: 0 ?- DULoxetine (CYMBALTA) 30 MG capsule; Take 1 capsule (30 mg total) by mouth daily.  Dispense: 30 capsule; Refill: 0 ?- CMP14+EGFR ?- TSH + free T4 ?-We will get baseline CMP to assess liver enzymes. ?-We will get TSH to ensure patient does not have hypothyroidism which may be causing anxiety. ?-Return to clinic in 2 weeks.  May also do telephone visit. ?-GAD-7 score today is 20 ? ?3. Lipid screening ?- Lipid Profile ? ?  ?Note:  This document was prepared using Dragon voice recognition software and may include unintentional dictation errors. ? ? ?

## 2021-11-08 NOTE — Progress Notes (Signed)
? ?  GYN VISIT ?Patient name: Roberta Baker MRN [id number]  Date of birth: Nov 06, 1985 ?Chief Complaint:   ?discuss colpo results ? ?History of Present Illness:   ?Roberta Baker is a 36 y.o. G80P2002 female being seen today for follow up regarding cervical dysplasia ? 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    ? ?She does not have a period as she is currently on Depot.  Of note, she does not desire another pregnancy.  She would also be interested in permanent sterilization. ? ?Denies postcoital bleeding.  Denies irregular discharge.  Denies pelvic or abdominal pain- no acute complaints ?Marland Kitchen ?No LMP recorded. Patient has had an injection. ? ?Depression screen [facility] 2/9 10/24/2021 06/02/2021 02/28/2021 06/07/2017 12/12/2016  ?Decreased Interest 0 2 1 0 0  ?Down, Depressed, Hopeless 1 3 3  0 1  ?PHQ - 2 Score 1 5 4  0 1  ?Altered sleeping 0 1 2 0 1  ?Tired, decreased energy 1 2 3  0 1  ?Change in appetite 0 0 2 0 -  ?Feeling bad or failure about yourself  0 3 3 0 0  ?Trouble concentrating 0 2 1 0 0  ?Moving slowly or fidgety/restless 0 0 0 0 0  ?Suicidal thoughts 0 0 0 0 0  ?PHQ-9 Score 2 13 15  0 3  ?Difficult doing work/chores - - - Not difficult at all -  ? ? ? ?Review of Systems:   ?Pertinent items are noted in HPI ?Denies fever/chills, dizziness, headaches, visual disturbances, fatigue, shortness of breath, chest pain, abdominal pain, vomiting, no problems with periods, bowel movements, urination, or intercourse unless otherwise stated above.  ?Pertinent History Reviewed:  ?Reviewed past medical,surgical, social, obstetrical and family history.  ?Reviewed problem list, medications and allergies. ?Physical Assessment:  ? ?Vitals:  ? 11/08/21 1053  ?BP: 118/80  ?Pulse: (!) 110  ?Weight: 56.5 kg  ?Height: 5' (1.524 m)  ?Body mass index is 24.33 kg/m?. ? ?     Physical Examination:  ? General appearance: alert, well appearing, and in no distress ? Psych: mood appropriate, normal affect ? Skin:  warm & dry  ? Cardiovascular: normal heart rate noted ? Respiratory: normal respiratory effort, no distress ? Abdomen: soft, non-tender, small umbilical hernia noted ? Pelvic: normal external genitalia, vulva, vagina, cervix, uterus and adnexa ? Extremities: no edema  ? ?Chaperone: Glenard Haring [place]   ? ?Assessment & Plan:  ?1) CIN 2 ?-reviewed ASCCP guidelines ?-discussed management with excisional procedure ?-reviewed risk/benefits and discussed alternative such as follow up colposcopy ?-since she does not desire a future pregnancy, she would prefer to proceed with LEEP ? ?2) Contraception ?-since plan for surgical intervention for dysplasia ?-would also consider permanent sterilization- discussed plan for bilateral salpingectomy ?-Reviewed risk/benefits including but not limited to risk of bleeding, infection and injury to surrounding organs.  Questions and concerns were addressed and she desires to proceed ?-Referral created- will plan to schedule in April ?-preop labs to be ordered ? ?Janyth Pupa, DO ?Attending [place], Faculty Practice ?[facility] ? ?

## 2021-11-09 LAB — LIPID PANEL
Chol/HDL Ratio: 5.9 ratio — ABNORMAL HIGH (ref 0.0–4.4)
Cholesterol, Total: 214 mg/dL — ABNORMAL HIGH (ref 100–199)
HDL: 36 mg/dL — ABNORMAL LOW (ref >39)
LDL Chol Calc (NIH): 153 mg/dL — ABNORMAL HIGH (ref 0–99)
Triglycerides: 138 mg/dL (ref 0–149)
VLDL Cholesterol Cal: 25 mg/dL (ref 5–40)

## 2021-11-09 LAB — CMP14+EGFR
ALT: 13 IU/L (ref 0–32)
AST: 15 IU/L (ref 0–40)
Albumin/Globulin Ratio: 2 (ref 1.2–2.2)
Albumin: 4.4 g/dL (ref 3.8–4.8)
Alkaline Phosphatase: 61 IU/L (ref 44–121)
BUN/Creatinine Ratio: 7 — ABNORMAL LOW (ref 9–23)
BUN: 6 mg/dL (ref 6–20)
Bilirubin Total: <0.2 mg/dL (ref 0.0–1.2)
CO2: 23 mmol/L (ref 20–29)
Calcium: 9.2 mg/dL (ref 8.7–10.2)
Chloride: 107 mmol/L — ABNORMAL HIGH (ref 96–106)
Creatinine, Ser: 0.82 mg/dL (ref 0.57–1.00)
Globulin, Total: 2.2 g/dL (ref 1.5–4.5)
Glucose: 92 mg/dL (ref 70–99)
Potassium: 3.8 mmol/L (ref 3.5–5.2)
Sodium: 143 mmol/L (ref 134–144)
Total Protein: 6.6 g/dL (ref 6.0–8.5)
eGFR: 96 mL/min/{1.73_m2} (ref >59)

## 2021-11-09 LAB — TSH+FREE T4
Free T4: 1.03 ng/dL (ref 0.82–1.77)
TSH: 0.771 u[IU]/mL (ref 0.450–4.500)

## 2021-11-15 ENCOUNTER — Encounter: Payer: Self-pay | Admitting: Nurse Practitioner

## 2021-11-15 ENCOUNTER — Other Ambulatory Visit: Payer: Self-pay

## 2021-11-15 ENCOUNTER — Telehealth: Payer: Self-pay | Admitting: *Deleted

## 2021-11-15 ENCOUNTER — Telehealth (INDEPENDENT_AMBULATORY_CARE_PROVIDER_SITE_OTHER): Payer: Medicaid Other | Admitting: Nurse Practitioner

## 2021-11-15 DIAGNOSIS — F418 Other specified anxiety disorders: Secondary | ICD-10-CM

## 2021-11-15 MED ORDER — DULOXETINE HCL 30 MG PO CPEP: 30.0000 mg | ORAL_CAPSULE | Freq: Every day | ORAL | 2 refills | Status: DC

## 2021-11-15 NOTE — Progress Notes (Signed)
? ? Patient ID: Roberta Baker, female    DOB: [birth year], 36 y.o.   MRN: [id number] ? ?Roberta Baker,you are scheduled for a virtual visit with your provider today.   ? ?Just as we do with appointments in the office, we must obtain your consent to participate.  Your consent will be active for this visit and any virtual visit you may have with one of our providers in the next 365 days.   ? ?If you have a MyChart account, I can also send a copy of this consent to you electronically.  All virtual visits are billed to your insurance company just like a traditional visit in the office.  As this is a virtual visit, video technology does not allow for your provider to perform a traditional examination.  This may limit your provider's ability to fully assess your condition.  If your provider identifies any concerns that need to be evaluated in person or the need to arrange testing such as labs, EKG, etc, we will make arrangements to do so.   ? ?Although advances in technology are sophisticated, we cannot ensure that it will always work on either your end or our end.  If the connection with a video visit is poor, we may have to switch to a telephone visit.  With either a video or telephone visit, we are not always able to ensure that we have a secure connection.   I need to obtain your verbal consent now.   Are you willing to proceed with your visit today?  ? ?Roberta Baker has provided verbal consent on 11/15/2021 for a virtual visit (video or telephone). ? ?Virtual Visit via telephone Note ? ?I connected with Roberta Baker on 11/15/21 at 2:40pm by telephone and verified that I am speaking with the correct person using two identifiers. Roberta Baker is currently located at home. The provider, Trenton [facility]mon [facility], NP is located in their office at time of visit. ? ?Call ended at 3:00pm. ? ?I discussed the limitations, risks, security and privacy concerns of performing an evaluation and management  service by telephone and the availability of in person appointments. I also discussed with the patient that there may be a patient responsible charge related to this service. The patient expressed understanding and agreed to proceed. ? ? ?History and Present Illness: ? ? ?36 year old female with history of anxiety has televisit appointment today for follow-up of anxiety medication.  Patient was started on duloxetine 30 mg tablets during last visit.  Patient was also given 30 tablets of Xanax.  Patient states that says she tries not to take Xanax daily but has taken a total of 4-5 tablets of the Xanax over the past 2 weeks.  Patient also states that she is tolerating duloxetine well and believes that they are helping.   ? ?Patient would like to continue on duloxetine and wants to know if she can get another prescription for Xanax.  ? ?Patient has no other concerns. ? ?Observations/Objective: ?Patient able to answer all questions appropriately.  No signs or symptoms of distress noted via telephone visit today. ? ?Assessment and Plan: ?1. Depression with anxiety ?-GAD-7 score is 17 today.  Last visit GAD score was 20.  Patient doing slightly better than last visit. ?-Patient has been referred to [facility] in [place].  Patient was given practices contact information and encouraged to call for appointment. ?-Duloxetine refilled ?- DULoxetine (CYMBALTA) 30 MG capsule; Take 1 capsule (30 mg total) by mouth  daily.  Dispense: 30 capsule; Refill: 2 ?-I currently do not feel comfortable continuing to prescribe Xanax.  The role of Xanax was to act as a bridge to allow for duloxetine to start working. ?-Encourage patient to discuss with psychiatry if Xanax is still an option for her.  Patient stated understanding. ?-We will follow-up with patient in 3 months to ensure that she is still doing well.  Return to clinic sooner if needed. ?  ? ?Follow up plan: ?Return in about 3 months (around 02/15/2022). ? ? ?  ?I discussed  the assessment and treatment plan with the patient. The patient was provided an opportunity to ask questions and all were answered. The patient agreed with the plan and demonstrated an understanding of the instructions. ?  ?The patient was advised to call back or seek an in-person evaluation if the symptoms worsen or if the condition fails to improve as anticipated. ? ?The above assessment and management plan was discussed with the patient. The patient verbalized understanding of and has agreed to the management plan. Patient is aware to call the clinic if symptoms persist or worsen. Patient is aware when to return to the clinic for a follow-up visit. Patient educated on when it is appropriate to go to the emergency department.  ? ? ?I provided 20 minutes of non-face-to-face time during this encounter. ? ? ? ?Claire Shown, NP ?   ?

## 2021-11-15 NOTE — Telephone Encounter (Signed)
[name], madry are scheduled for a virtual visit with your provider today.   ? ?Just as we do with appointments in the office, we must obtain your consent to participate.  Your consent will be active for this visit and any virtual visit you may have with one of our providers in the next 365 days.   ? ?If you have a MyChart account, I can also send a copy of this consent to you electronically.  All virtual visits are billed to your insurance company just like a traditional visit in the office.  As this is a virtual visit, video technology does not allow for your provider to perform a traditional examination.  This may limit your provider's ability to fully assess your condition.  If your provider identifies any concerns that need to be evaluated in person or the need to arrange testing such as labs, EKG, etc, we will make arrangements to do so.   ? ?Although advances in technology are sophisticated, we cannot ensure that it will always work on either your end or our end.  If the connection with a video visit is poor, we may have to switch to a telephone visit.  With either a video or telephone visit, we are not always able to ensure that we have a secure connection.   I need to obtain your verbal consent now.   Are you willing to proceed with your visit today?  ? ?Roberta Baker has provided verbal consent on 11/15/2021 for a virtual visit (video or telephone). ? ? ?[name], LPN ?0/35/4656  2:29 PM ?  ?

## 2021-12-15 ENCOUNTER — Other Ambulatory Visit (HOSPITAL_COMMUNITY): Payer: Medicaid Other

## 2022-01-01 ENCOUNTER — Ambulatory Visit (INDEPENDENT_AMBULATORY_CARE_PROVIDER_SITE_OTHER): Payer: Medicaid Other | Admitting: Obstetrics & Gynecology

## 2022-01-01 ENCOUNTER — Encounter: Payer: Self-pay | Admitting: Obstetrics & Gynecology

## 2022-01-01 VITALS — BP 125/69 | HR 84 | Ht 60.0 in | Wt 147.8 lb

## 2022-01-01 DIAGNOSIS — N871 Moderate cervical dysplasia: Secondary | ICD-10-CM

## 2022-01-01 DIAGNOSIS — Z3009 Encounter for other general counseling and advice on contraception: Secondary | ICD-10-CM

## 2022-01-01 DIAGNOSIS — Z01818 Encounter for other preprocedural examination: Secondary | ICD-10-CM | POA: Diagnosis not present

## 2022-01-01 NOTE — Progress Notes (Signed)
? ?  GYN VISIT ?Patient name: Roberta Baker MRN [id number]  Date of birth: Nov 13, 1985 ?Chief Complaint:   ?Pre-op Exam ? ?History of Present Illness:   ?Roberta Baker is a 36 y.o. [phone]  female being seen today for preop exam for upcoming bilateral salpingectomy scheduled for 01/16/22- plan for laparoscopic bilateral salpingectomy and LEEP due to CIN 2 ? ?Today she notes that she is concerned about the return of her menses.  She notes h/o HMB and cramping.  She loves with Depot that she does not have a period.  However, she does still wish to continue with permanent sterilization. ? ?Reports no other acute changes or concerns since her last visit. ?Patient's last menstrual period was 12/29/2021 (exact date). ? ? ?  11/15/2021  ?  2:47 PM 10/24/2021  ?  9:11 AM 06/02/2021  ?  8:46 AM 02/28/2021  ?  9:50 AM 06/07/2017  ? 11:29 AM  ?Depression screen PHQ 2/9  ?Decreased Interest 0 0 2 1 0  ?Down, Depressed, Hopeless 0 1 3 3  0  ?PHQ - 2 Score 0 1 5 4  0  ?Altered sleeping 0 0 1 2 0  ?Tired, decreased energy 0 1 2 3  0  ?Change in appetite 0 0 0 2 0  ?Feeling bad or failure about yourself  0 0 3 3 0  ?Trouble concentrating 0 0 2 1 0  ?Moving slowly or fidgety/restless 0 0 0 0 0  ?Suicidal thoughts 0 0 0 0 0  ?PHQ-9 Score 0 2 13 15  0  ?Difficult doing work/chores Not difficult at all    Not difficult at all  ? ? ? ?Review of Systems:   ?Pertinent items are noted in HPI ?Denies fever/chills, dizziness, headaches, visual disturbances, fatigue, shortness of breath, chest pain, abdominal pain, vomiting, no problems with periods, bowel movements, urination, or intercourse unless otherwise stated above.  ?Pertinent History Reviewed:  ?Reviewed past medical,surgical, social, obstetrical and family history.  ?Reviewed problem list, medications and allergies. ?Physical Assessment:  ? ?Vitals:  ? 01/01/22 1135  ?BP: 125/69  ?Pulse: 84  ?Weight: 147 lb 12.8 oz (67 kg)  ?Height: 5' (1.524 m)  ?Body mass index is 28.87  kg/m?. ? ?     Physical Examination:  ? General appearance: alert, well appearing, and in no distress ? Psych: mood appropriate, normal affect ? Skin: warm & dry  ? Cardiovascular: RRR ? Respiratory: CTAB, normal respiratory effort, no distress ? Abdomen: soft, non-tender  ? Pelvic: examination not indicated ? Extremities: no edema  ? ?Chaperone: N/A   ? ?Assessment & Plan:  ?1) Permanent sterilization ?-Discussed plan for laparoscopic bilateral salpingectomy ?-discuss though unlikely risk of post-tubal ligation syndrome ?-reviewed risk/benefit and alternatives including but not limited to risk of bleeding, infection and injury.  Also discussed staying on Depot ?-desires to proceed with salpingectomy ?-may consider low dose POP or continue Depot to manage her period ? ?2) CIN2 ?-plan for LEEP ?-risk/benefit reviewed ?-questions were addressed ? ?Scheduled for May 16th with 1-2wk postop visit ? ? ? ? , DO ?Attending Obstetrician & Gynecologist, Faculty Practice ?[facility]03[facility], [facility] ? ? ? ?

## 2022-01-03 IMAGING — CT CT NECK W/ CM
3 of 4 series · 13 of 33 positions shown, 16 images · IV contrast (Omnipaque or Isovue)
Comparison: None.

CLINICAL DATA: Initial evaluation for acute pain and swelling to
left face.

EXAM:
CT NECK WITH CONTRAST
TECHNIQUE: Multidetector CT imaging of the neck was performed using the
standard protocol following the bolus administration of intravenous
contrast.
CONTRAST:  75mL OMNIPAQUE IOHEXOL 300 MG/ML  SOLN

[Series 2: axial neck · axial · 0.48mm/px · z∈[-69,+69]mm · 5 of 105 slices shown, 7 images]
[im 18/105  soft-tissue]
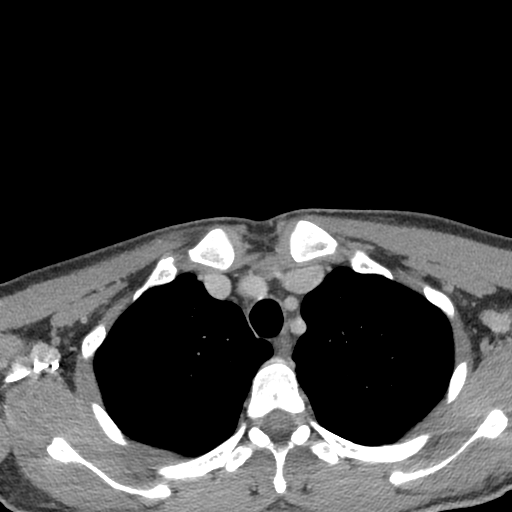
[im 18/105  bone]
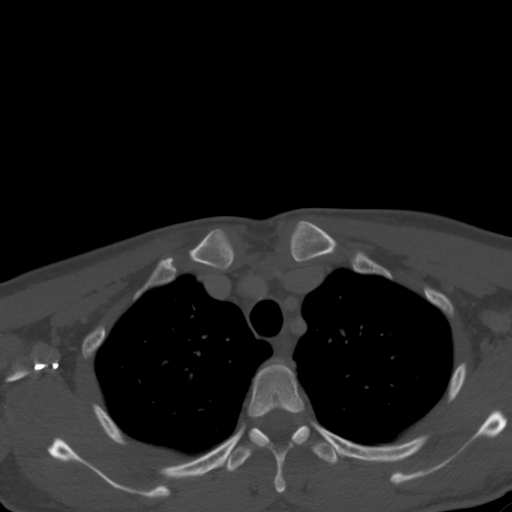
[im 35/105  bone]
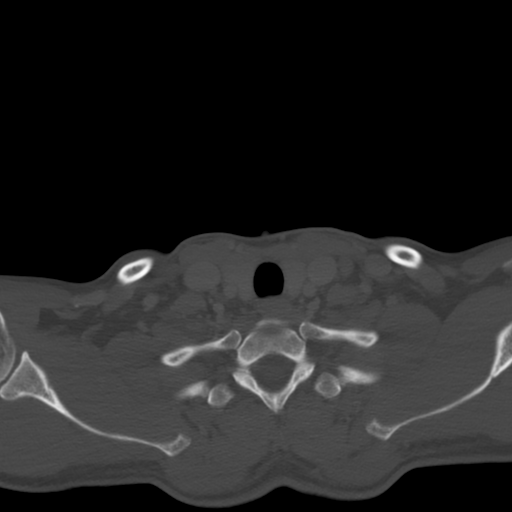
[im 53/105  bone]
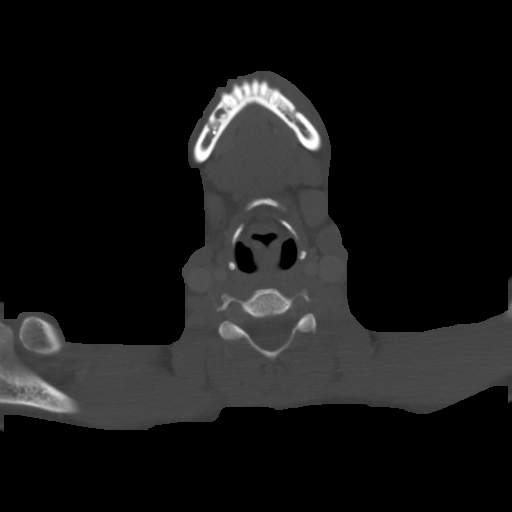
[im 70/105  bone]
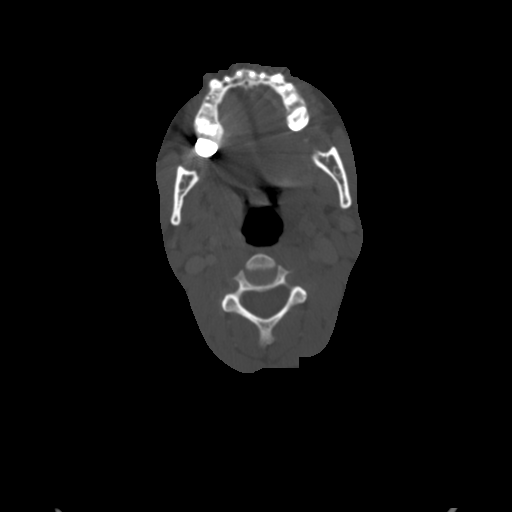
[im 87/105  soft-tissue]
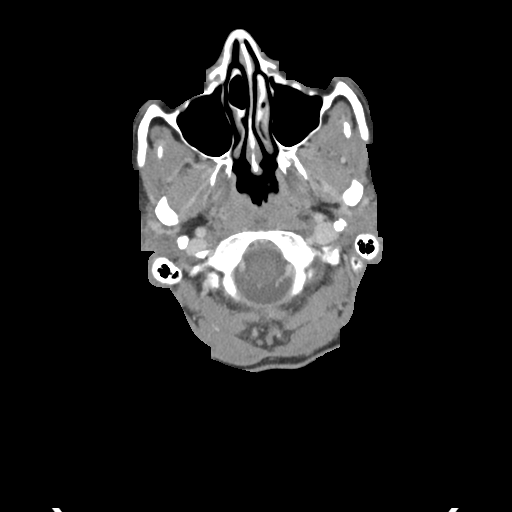
[im 87/105  bone]
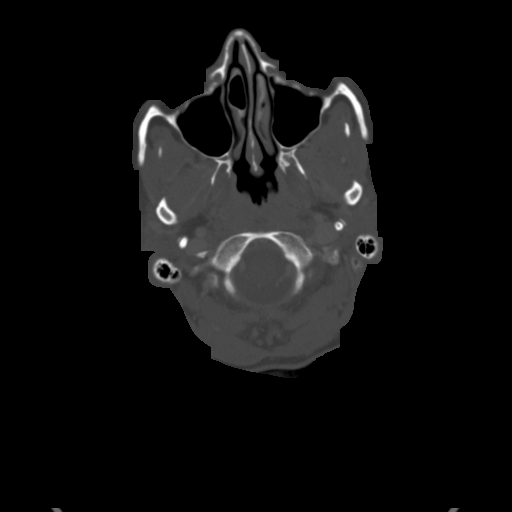

[Series 4: cor neck · coronal · 0.41mm/px · 3 of 110 slices shown]
[im 22/110  bone]
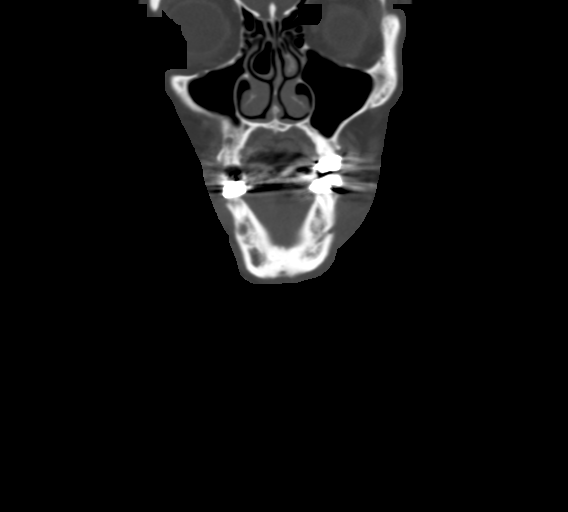
[im 44/110  bone]
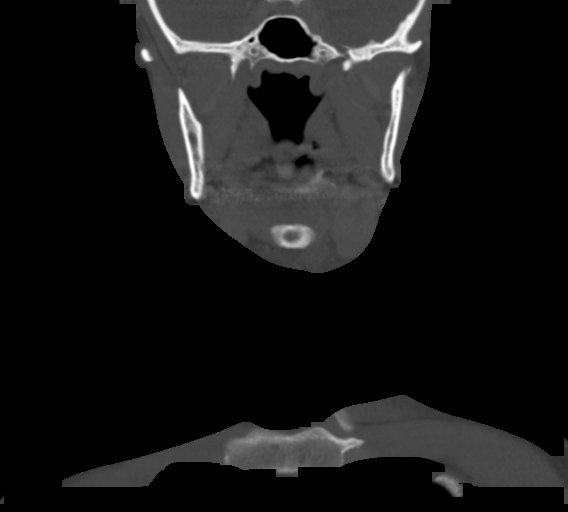
[im 66/110  bone]
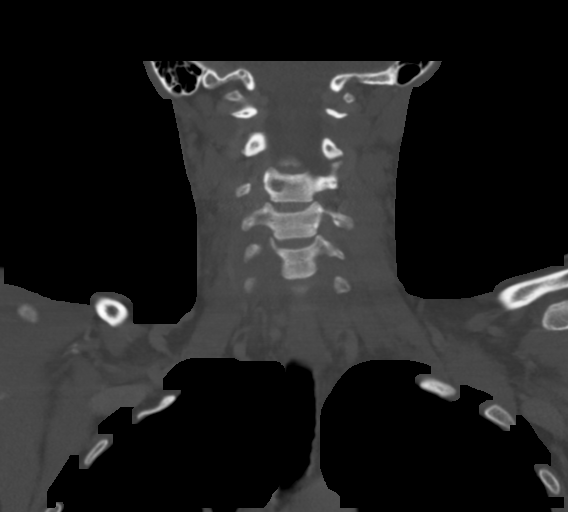

[Series 5: sag neck · sagittal · 0.41mm/px · 5 of 101 slices shown, 6 images]
[im 34/101  bone]
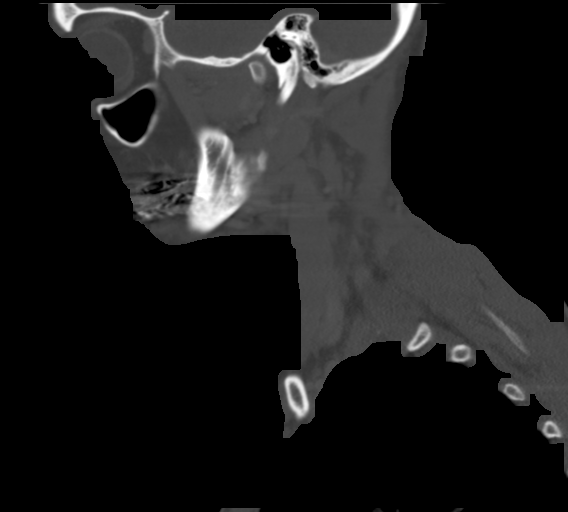
[im 42/101  bone]
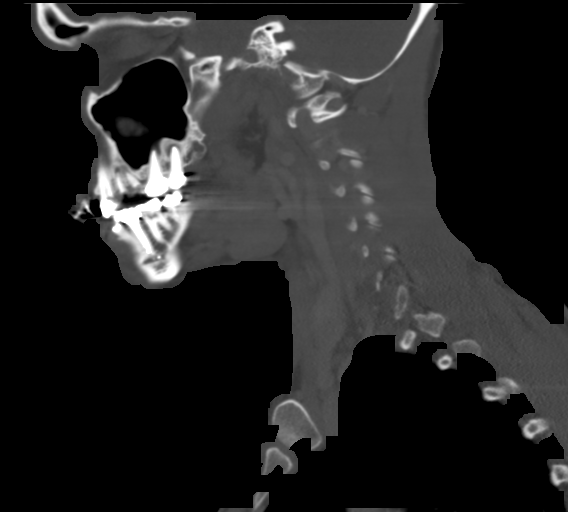
[im 51/101  soft-tissue]
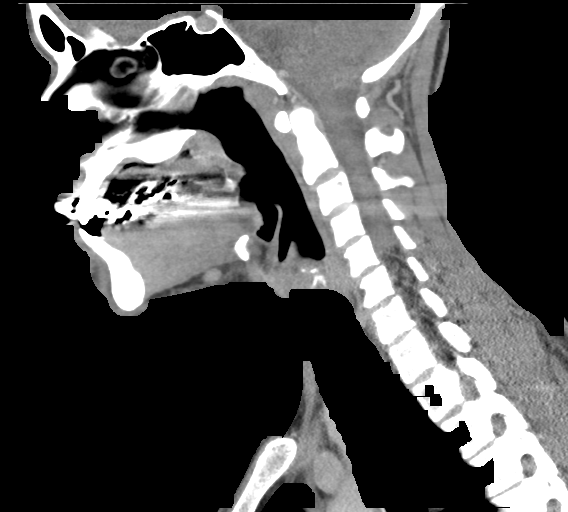
[im 51/101  bone]
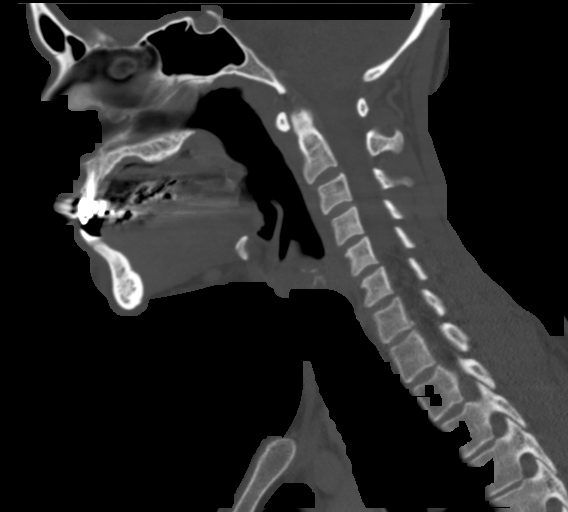
[im 59/101  bone]
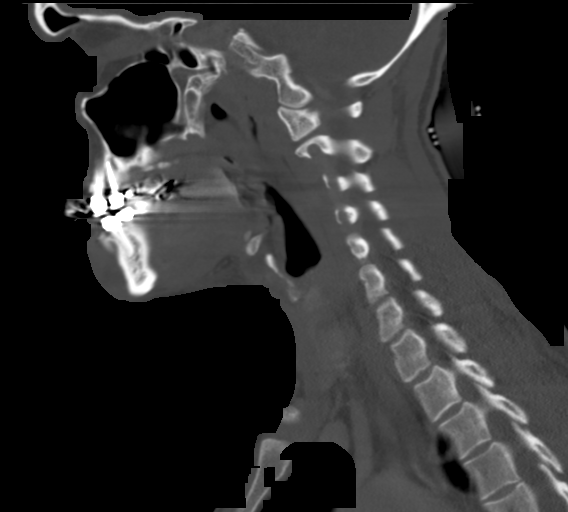
[im 67/101  bone]
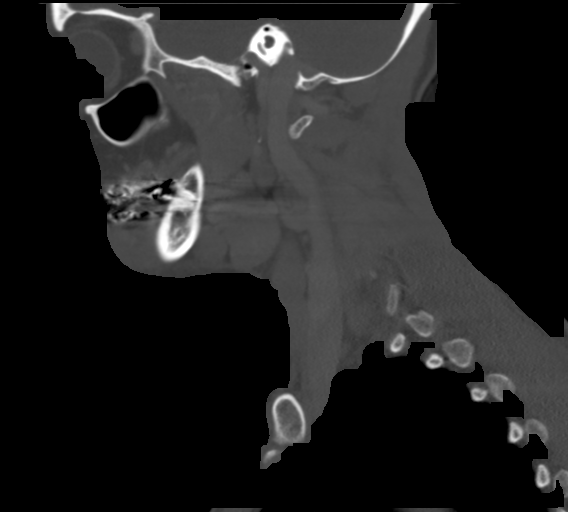

[13 of 33 positions shown; findings below may reference images not displayed]

FINDINGS: Pharynx and larynx: There is asymmetric soft tissue swelling with
inflammatory stranding adjacent to the parasymphyseal region of the
left mandible, concerning for acute infection/cellulitis. Extension
to involve the adjacent submental region and left submandibular
space. No overt extension into the floor of mouth to suggest
Ludwig's angina at this time. There is a superimposed rim enhancing
collection measuring 1.4 x 1.0 x 1.6 cm, consistent with abscess.
Periapical lucency noted at the left mandibular canine, suspected to
be the source of infection (series 3, image 55).

Remainder of the oral cavity is grossly within normal limits.
Oropharynx and nasopharynx within normal limits. No retropharyngeal
collection or swelling. Epiglottis normal. Vallecula clear.
Remainder of the hypopharynx and supraglottic larynx within normal
limits. Glottis normal. Subglottic airway clear.

Salivary glands: Salivary glands including the parotid and
submandibular glands are within normal limits.

Thyroid: Normal.

Lymph nodes: Mildly prominent submental nodes measure up to 7 mm.
Left level 1 B node measures 12 mm. Findings presumably reactive. No
other enlarged or pathologic adenopathy within the neck.

Vascular: Normal intravascular enhancement seen throughout the neck.

Limited intracranial: Unremarkable.

Visualized orbits: Unremarkable.

Mastoids and visualized paranasal sinuses: Visualized paranasal
sinuses are largely clear. Subcentimeter osteoma noted at the right
the ethmoidal air cells. Mastoid air cells and middle ear cavities
are well pneumatized and free of fluid.

Skeleton: No acute osseous finding. No discrete or worrisome osseous
lesions.

Upper chest: Visualized upper chest demonstrates no acute finding.
Partially visualized lungs are clear.

Other: None.
IMPRESSION: 1. Asymmetric soft tissue swelling with inflammatory stranding
adjacent to the anterior left mandible, concerning for acute
infection/cellulitis. Superimposed 1.4 x 1.0 x 1.6 cm abscess as
above.
2. Periapical lucency at the root of the left mandibular canine,
suspected to be the source of infection.
3. Mildly prominent cervical adenopathy as above, likely reactive.

## 2022-01-10 NOTE — Patient Instructions (Signed)
? ? ? ? ? ? ? ? ? ? Roberta Baker ? 01/10/2022  ?  ? @PREFPERIOPPHARMACY @ ? ? Your procedure is scheduled on  01/16/2022. ? ? [facility]  06[facility]  A.M. ? ? Call this number if you have problems the morning of surgery: ? [phone] ? ? Remember: ? Do not eat or drink after midnight. ?  ?  ? Take these medicines the morning of surgery with A SIP OF WATER  ? ?                                      cymbalta. ?  ? ? Do not wear jewelry, make-up or nail polish. ? Do not wear lotions, powders, or perfumes, or deodorant. ? Do not shave 48 hours prior to surgery.  Men may shave face and neck. ? Do not bring valuables to the hospital. ? [facility] is not responsible for any belongings or valuables. ? ?Contacts, dentures or bridgework may not be worn into surgery.  Leave your suitcase in the car.  After surgery it may be brought to your room. ? ?For patients admitted to the hospital, discharge time will be determined by your treatment team. ? ?Patients discharged the day of surgery will not be allowed to drive home and must have someone with them for 24 hours.  ? ? ?Special instructions:   DO NOT smoke tobacco or vape for 24 hours before your procedure. ? ?Please read over the following fact sheets that you were given. ?Coughing and Deep Breathing, Surgical Site Infection Prevention, Anesthesia Post-op Instructions, and Care and Recovery After Surgery ?  ? ? ? LEEP POST-PROCEDURE INSTRUCTIONS ? ?You may take Ibuprofen, Aleve or Tylenol for pain if needed.  Cramping is normal. ? ?You will have black and/or bloody discharge at first.  This will lighten and then turn clear before completely resolving.  This will take 2 to 3 weeks. ? ?Put nothing in your vagina until the bleeding or discharge stops (usually 2 or3 days). ? ?You need to call if you have redness around the biopsy site, if there is any unusual draining, if the bleeding is heavy, or if you are concerned. ? ?Shower or bathe as normal ? ?We will call  you within one week with results or we will discuss the results at your follow-up appointment if needed. ? ?You will need to return for a follow-up Pap smear as directed by your physician.Salpingectomy, Care After ?The following information offers guidance on how to care for yourself after your procedure. Your health care provider may also give you more specific instructions. If you have problems or questions, contact your health care provider. ?What can I expect after the procedure? ?After the procedure, it is common to have: ?Pain in your abdomen. ?Light vaginal bleeding (spotting) for a few days. ?Tiredness. ?Your recovery time will depend on which method was used for your surgery. ?Follow these instructions at home: ?Medicines ?Take over-the-counter and prescription medicines only as told by your health care provider. ?Ask your health care provider if the medicine prescribed to you: ?Requires you to avoid driving or using machinery. ?Can cause constipation. You may need to take actions to prevent or treat constipation, such as: ?Drink enough fluid to keep your urine pale yellow. ?Take over-the-counter or prescription medicines. ?Eat foods that are high in fiber, such as beans, whole grains, and fresh  fruits and vegetables. ?Limit foods that are high in fat and processed sugars, such as fried or sweet foods. ?Incision care ? ?Follow instructions from your health care provider about how to take care of your incision or incisions. Make sure you: ?Wash your hands with soap and water for at least 20 seconds before and after you change your bandage (dressing). If soap and water are not available, use hand sanitizer. ?Change or remove your dressing as told by your health care provider. ?Leave stitches (sutures), skin glue, staples, or adhesive strips in place. These skin closures may need to stay in place for 2 weeks or longer. If adhesive strip edges start to loosen and curl up, you may trim the loose edges. Do not  remove adhesive strips completely unless your health care provider tells you to do that. ?Keep your dressing clean and dry. ?Check your incision area every day for signs of infection. Check for: ?Redness, swelling, or pain that gets worse. ?Fluid or blood. ?Warmth. ?Pus or a bad smell. ?Activity ?Rest as told by your health care provider. ?Avoid sitting for a long time without moving. Get up to take short walks every 1-2 hours. This is important to improve blood flow and breathing. Ask for help if you feel weak or unsteady. ?Return to your normal activities as told by your health care provider. Ask your health care provider what activities are safe for you. ?Do not drive until your health care provider says that it is safe. ?Do not lift anything that is heavier than 10 lb (4.5 kg), or the limit that you are told, until your health care provider says that it is safe. This may last for 2-6 weeks depending on your surgery. ?Do not douche, use tampons, or have sex until your health care provider approves. ?General instructions ?Do not use any products that contain nicotine or tobacco. These products include cigarettes, chewing tobacco, and vaping devices, such as e-cigarettes. These can delay healing after surgery. If you need help quitting, ask your health care provider. ?Wear compression stockings as told by your health care provider. These stockings help to prevent blood clots and reduce swelling in your legs. ?Do not take baths, swim, or use a hot tub until your health care provider approves. You may take showers. ?Keep all follow-up visits. This is important. ?Contact a health care provider if: ?You have pain when you urinate. ?You have redness, swelling, or more pain around an incision or an incision feels warm to the touch. ?You have pus, fluid, blood, or a bad smell coming from an incision or an incision starts to open. ?You have a fever. ?You have abdominal pain that gets worse or does not get better with  medicine. ?You have a rash. ?You feel light-headed, have nausea and vomiting, or both. ?Get help right away if: ?You have pain in your chest or leg. ?You develop shortness of breath. ?You faint. ?You have increased or heavy vaginal bleeding, such as soaking a sanitary napkin in an hour. ?These symptoms may represent a serious problem that is an emergency. Do not wait to see if the symptoms will go away. Get medical help right away. Call your local emergency services (911 in the [place] Do not drive yourself to the hospital. ?Summary ?After the procedure, it is common to feel tired, have pain in your abdomen, and have light vaginal bleeding for a few days. ?Follow instructions from your health care provider about how to take care of your incision or incisions. ?  Return to your normal activities as told by your health care provider. Ask your health care provider what activities are safe for you. ?Do not douche, use tampons, or have sex until your health care provider approves. ?Keep all follow-up visits. This is important. ?This information is not intended to replace advice given to you by your health care provider. Make sure you discuss any questions you have with your health care provider. ?Document Revised: 07/12/2020 Document Reviewed: 07/12/2020 ?Elsevier Patient Education ? 2023 [employer] ? ?General Anesthesia, Adult, Care After ?This sheet gives you information about how to care for yourself after your procedure. Your health care provider may also give you more specific instructions. If you have problems or questions, contact your health care provider. ?What can I expect after the procedure? ?After the procedure, the following side effects are common: ?Pain or discomfort at the IV site. ?Nausea. ?Vomiting. ?Sore throat. ?Trouble concentrating. ?Feeling cold or chills. ?Feeling weak or tired. ?Sleepiness and fatigue. ?Soreness and body aches. These side effects can affect parts of the body that were not  involved in surgery. ?Follow these instructions at home: ?For the time period you were told by your health care provider: ? ?Rest. ?Do not participate in activities where you could fall or become injured. ?Do not dr

## 2022-01-12 ENCOUNTER — Encounter (HOSPITAL_COMMUNITY)
Admission: RE | Admit: 2022-01-12 | Discharge: 2022-01-12 | Disposition: A | Discharge status: Home / Self Care | Payer: Medicaid Other | Source: Ambulatory Visit | Attending: Obstetrics & Gynecology | Admitting: Obstetrics & Gynecology

## 2022-01-12 VITALS — BP 98/69 | HR 91 | Temp 97.8°F | Resp 16 | Ht 60.0 in | Wt 147.0 lb

## 2022-01-12 DIAGNOSIS — F17200 Nicotine dependence, unspecified, uncomplicated: Secondary | ICD-10-CM | POA: Diagnosis not present

## 2022-01-12 DIAGNOSIS — Z01818 Encounter for other preprocedural examination: Secondary | ICD-10-CM | POA: Insufficient documentation

## 2022-01-12 DIAGNOSIS — Z308 Encounter for other contraceptive management: Secondary | ICD-10-CM

## 2022-01-12 LAB — CBC
HCT: 35 % — ABNORMAL LOW (ref 36.0–46.0)
Hemoglobin: 11.4 g/dL — ABNORMAL LOW (ref 12.0–15.0)
MCH: 27.8 pg (ref 26.0–34.0)
MCHC: 32.6 g/dL (ref 30.0–36.0)
MCV: 85.4 fL (ref 80.0–100.0)
Platelets: 404 10*3/uL — ABNORMAL HIGH (ref 150–400)
RBC: 4.1 MIL/uL (ref 3.87–5.11)
RDW: 16.9 % — ABNORMAL HIGH (ref 11.5–15.5)
WBC: 7.7 10*3/uL (ref 4.0–10.5)
nRBC: 0 % (ref 0.0–0.2)

## 2022-01-12 LAB — PREGNANCY, URINE: Preg Test, Ur: NEGATIVE

## 2022-01-13 NOTE — H&P (Signed)
[facility] Gynecology Attending History and Physical ? ?LYNNAYA LAPPIN is a 36 y.o. [id number] who presents for scheduled laparoscopic bilateral salpingectomy and LEEP due to desire for permanent sterilization and cervical dysplasia (CIN 2) ? ?In review, she does not want anymore children and desires a permanent option instead of Depot every 44mos.  Denies issues with her menses. ? ?-Cervical dysplasia: Recent colposcopy completed 11/08/2021 showed CIN 2 again at 6 oclock.  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  ? ?Denies any abnormal vaginal discharge, fevers, chills, sweats, dysuria, nausea, vomiting, other GI or GU symptoms or other general symptoms. ? ?Past Medical History:  ?Diagnosis Date  ? Anxiety   ? Depression   ? Headache   ? HPV (human papilloma virus) infection   ? Infection   ? UTI  ? Kidney infection   ? Kidney stones   ? Nausea and vomiting in pregnancy 02/07/2017  ? Pain, dental 02/07/2017  ? Seizures ([place])   ? X1 from abrupt klonopin w/d  ? ?Past Surgical History:  ?Procedure Laterality Date  ? BREAST ENHANCEMENT SURGERY    ? BREAST SURGERY    ? ?OB History  ?Gravida Para Term Preterm AB Living  ?2 2 2    0 2  ?SAB IAB Ectopic Multiple Live Births  ?      0 2  ?  ?# Outcome Date GA Lbr Len/2nd Weight Sex Delivery Anes PTL Lv  ?2 Term 04/17/17 [redacted]w[redacted]d 12:26 / 00:18 2761 g M Vag-Vacuum EPI  LIV  ?1 Term           ?Patient denies any other pertinent gynecologic issues.  ?No current facility-administered medications on file prior to encounter.  ? ?Current Outpatient Medications on File Prior to Encounter  ?Medication Sig Dispense Refill  ? diphenhydrAMINE (BENADRYL) 25 MG tablet Take 25 mg by mouth 2 (two) times daily as needed (itchy eyes).    ? promethazine (PHENERGAN) 25 MG tablet Take 0.5-1 tablets (12.5-25 mg total) by mouth every 6 (six) hours as needed for nausea or vomiting. 30 tablet 6  ? tetrahydrozoline-zinc (VISINE-AC)  0.05-0.25 % ophthalmic solution Place 2 drops into both eyes 3 (three) times daily as needed (itchy eyes).    ? medroxyPROGESTERone (DEPO-PROVERA) 150 MG/ML injection Inject 1 mL (150 mg total) into the muscle every 3 (three) months. 1 mL 3  ? ?Allergies  ?Allergen Reactions  ? Buspar [Buspirone] Other (See Comments)  ?  Caused nightmares/hallucinations  ? Sulfa Antibiotics Nausea And Vomiting  ? ? ?Social History:   reports that she has been smoking cigarettes. She has a 0.38 pack-year smoking history. She has never used smokeless tobacco. She reports that she does not currently use drugs after having used the following drugs: Marijuana. She reports that she does not drink alcohol. ?Family History  ?Problem Relation Age of Onset  ? Cancer Maternal Grandmother   ?     breast cancer  ? Heart attack Maternal Grandfather   ? Mental illness Mother   ? Seizures Mother   ? Cancer Maternal Uncle   ? ? ?Review of Systems: Pertinent items noted in HPI and remainder of comprehensive ROS otherwise negative. ? ?PHYSICAL EXAM: ?Last menstrual period 12/29/2021. ?CONSTITUTIONAL: Well-developed, well-nourished female in no acute distress.  ?SKIN: Skin is warm and dry. No rash noted. Not diaphoretic. No erythema. No pallor. ?NEUROLOGIC: Alert and oriented to person, place, and time. Normal reflexes, muscle tone coordination. No  cranial nerve deficit noted. ?PSYCHIATRIC: Normal mood and affect. Normal behavior. Normal judgment and thought content. ?CARDIOVASCULAR: Normal heart rate noted, regular rhythm ?RESPIRATORY: Effort and breath sounds normal, no problems with respiration noted ?ABDOMEN: Soft, nontender, nondistended. Small umbilical hernia noted ?PELVIC: deferred ?MUSCULOSKELETAL: no calf tenderness bilaterally ?EXT: no edema bilaterally, normal pulses ? ?Labs: ?Results for orders placed or performed during the hospital encounter of 01/12/22 (from the past 336 hour(s))  ?CBC  ? Collection Time: 01/12/22  9:22 AM  ?Result  Value Ref Range  ? WBC 7.7 4.0 - 10.5 K/uL  ? RBC 4.10 3.87 - 5.11 MIL/uL  ? Hemoglobin 11.4 (L) 12.0 - 15.0 g/dL  ? HCT 35.0 (L) 36.0 - 46.0 %  ? MCV 85.4 80.0 - 100.0 fL  ? MCH 27.8 26.0 - 34.0 pg  ? MCHC 32.6 30.0 - 36.0 g/dL  ? RDW 16.9 (H) 11.5 - 15.5 %  ? Platelets 404 (H) 150 - 400 K/uL  ? nRBC 0.0 0.0 - 0.2 %  ?Pregnancy, urine  ? Collection Time: 01/12/22  9:22 AM  ?Result Value Ref Range  ? Preg Test, Ur NEGATIVE NEGATIVE  ? ? ?Assessment: ?Cervical dysplasia- CIN 2 ?Desire for permanent sterilization ? ?Plan: ?Laparoscopic bilateral salpingectomy and LEEP ?-NPO ?-LR @ 125cc/hr ?-SCDs to OR ?-Risk/benefits and alternatives reviewed with the patient including but not limited to risk of bleeding, infection and injury to surrounding organs.  Questions and concerns were addressed and pt desires to proceed ? ?Janyth Pupa, DO ?Attending [place], Faculty Practice ?[facility] for [facility] ? ?

## 2022-01-16 ENCOUNTER — Encounter (HOSPITAL_COMMUNITY): Payer: Self-pay | Admitting: Obstetrics & Gynecology

## 2022-01-16 ENCOUNTER — Ambulatory Visit (HOSPITAL_COMMUNITY): Payer: Medicaid Other | Admitting: Anesthesiology

## 2022-01-16 ENCOUNTER — Ambulatory Visit (HOSPITAL_BASED_OUTPATIENT_CLINIC_OR_DEPARTMENT_OTHER): Payer: Medicaid Other | Admitting: Anesthesiology

## 2022-01-16 ENCOUNTER — Other Ambulatory Visit: Payer: Self-pay

## 2022-01-16 ENCOUNTER — Encounter (HOSPITAL_COMMUNITY)
Admission: RE | Disposition: A | Discharge status: Home / Self Care | Payer: Self-pay | Source: Home / Self Care | Attending: Obstetrics & Gynecology

## 2022-01-16 ENCOUNTER — Ambulatory Visit (HOSPITAL_COMMUNITY)
Admission: RE | Admit: 2022-01-16 | Discharge: 2022-01-16 | Disposition: A | Discharge status: Home / Self Care | Payer: Medicaid Other | Attending: Obstetrics & Gynecology | Admitting: Obstetrics & Gynecology

## 2022-01-16 DIAGNOSIS — Z302 Encounter for sterilization: Secondary | ICD-10-CM

## 2022-01-16 DIAGNOSIS — Z793 Long term (current) use of hormonal contraceptives: Secondary | ICD-10-CM | POA: Insufficient documentation

## 2022-01-16 DIAGNOSIS — Z308 Encounter for other contraceptive management: Secondary | ICD-10-CM

## 2022-01-16 DIAGNOSIS — N870 Mild cervical dysplasia: Secondary | ICD-10-CM | POA: Diagnosis not present

## 2022-01-16 DIAGNOSIS — N871 Moderate cervical dysplasia: Secondary | ICD-10-CM

## 2022-01-16 DIAGNOSIS — K660 Peritoneal adhesions (postprocedural) (postinfection): Secondary | ICD-10-CM | POA: Diagnosis not present

## 2022-01-16 DIAGNOSIS — Z01818 Encounter for other preprocedural examination: Secondary | ICD-10-CM

## 2022-01-16 DIAGNOSIS — Z9079 Acquired absence of other genital organ(s): Secondary | ICD-10-CM

## 2022-01-16 DIAGNOSIS — F17210 Nicotine dependence, cigarettes, uncomplicated: Secondary | ICD-10-CM | POA: Insufficient documentation

## 2022-01-16 HISTORY — PX: LAPAROSCOPIC BILATERAL SALPINGECTOMY: SHX5889

## 2022-01-16 HISTORY — PX: LEEP: SHX91

## 2022-01-16 SURGERY — SALPINGECTOMY, BILATERAL, LAPAROSCOPIC
Anesthesia: General | Site: Cervix

## 2022-01-16 MED ORDER — OXYCODONE-ACETAMINOPHEN 5-325 MG PO TABS: 1.0000 | ORAL_TABLET | Freq: Four times a day (QID) | ORAL | 0 refills | Status: AC | PRN

## 2022-01-16 MED ORDER — KETOROLAC TROMETHAMINE 30 MG/ML IJ SOLN
INTRAMUSCULAR | Status: AC
  Administered 2022-01-16: 30 mg
  Filled 2022-01-16: qty 1

## 2022-01-16 MED ORDER — DEXAMETHASONE SODIUM PHOSPHATE 10 MG/ML IJ SOLN
INTRAMUSCULAR | Status: DC | PRN
  Administered 2022-01-16: 10 mg via INTRAVENOUS

## 2022-01-16 MED ORDER — HYDROMORPHONE HCL 1 MG/ML IJ SOLN: 0.2500 mg | INTRAMUSCULAR | Status: DC | PRN

## 2022-01-16 MED ORDER — ONDANSETRON HCL 4 MG/2ML IJ SOLN
INTRAMUSCULAR | Status: AC
  Filled 2022-01-16: qty 2

## 2022-01-16 MED ORDER — BUPIVACAINE HCL (PF) 0.25 % IJ SOLN
INTRAMUSCULAR | Status: AC
  Filled 2022-01-16: qty 30

## 2022-01-16 MED ORDER — FENTANYL CITRATE (PF) 250 MCG/5ML IJ SOLN
INTRAMUSCULAR | Status: AC
  Filled 2022-01-16: qty 5

## 2022-01-16 MED ORDER — IBUPROFEN 600 MG PO TABS: 600.0000 mg | ORAL_TABLET | Freq: Four times a day (QID) | ORAL | 0 refills | Status: DC | PRN

## 2022-01-16 MED ORDER — ONDANSETRON HCL 4 MG/2ML IJ SOLN
INTRAMUSCULAR | Status: DC | PRN
Start: 2022-01-16 — End: 2022-01-16
  Administered 2022-01-16: 4 mg via INTRAVENOUS

## 2022-01-16 MED ORDER — LIDOCAINE-EPINEPHRINE 0.5 %-1:200000 IJ SOLN
INTRAMUSCULAR | Status: DC | PRN
  Administered 2022-01-16: 20 mL

## 2022-01-16 MED ORDER — PROPOFOL 10 MG/ML IV BOLUS
INTRAVENOUS | Status: DC | PRN
  Administered 2022-01-16: 150 mg via INTRAVENOUS

## 2022-01-16 MED ORDER — 0.9 % SODIUM CHLORIDE (POUR BTL) OPTIME
TOPICAL | Status: DC | PRN
  Administered 2022-01-16: 1000 mL

## 2022-01-16 MED ORDER — PROPOFOL 10 MG/ML IV BOLUS
INTRAVENOUS | Status: AC
  Filled 2022-01-16: qty 20

## 2022-01-16 MED ORDER — DEXAMETHASONE SODIUM PHOSPHATE 10 MG/ML IJ SOLN
INTRAMUSCULAR | Status: AC
  Filled 2022-01-16: qty 1

## 2022-01-16 MED ORDER — CHLORHEXIDINE GLUCONATE 0.12 % MT SOLN
OROMUCOSAL | Status: AC
  Administered 2022-01-16: 15 mL via OROMUCOSAL
  Filled 2022-01-16: qty 15

## 2022-01-16 MED ORDER — LACTATED RINGERS IV SOLN: INTRAVENOUS | Status: DC

## 2022-01-16 MED ORDER — MEPERIDINE HCL 50 MG/ML IJ SOLN: 6.2500 mg | INTRAMUSCULAR | Status: DC | PRN

## 2022-01-16 MED ORDER — LIDOCAINE-EPINEPHRINE 0.5 %-1:200000 IJ SOLN
INTRAMUSCULAR | Status: AC
  Filled 2022-01-16: qty 1

## 2022-01-16 MED ORDER — MIDAZOLAM HCL 5 MG/5ML IJ SOLN
INTRAMUSCULAR | Status: DC | PRN
  Administered 2022-01-16: 2 mg via INTRAVENOUS

## 2022-01-16 MED ORDER — FERRIC SUBSULFATE (BULK) SOLN
Status: DC | PRN
Start: 2022-01-16 — End: 2022-01-16
  Administered 2022-01-16: 1

## 2022-01-16 MED ORDER — ROCURONIUM BROMIDE 10 MG/ML (PF) SYRINGE
PREFILLED_SYRINGE | INTRAVENOUS | Status: AC
  Filled 2022-01-16: qty 10

## 2022-01-16 MED ORDER — BUPIVACAINE HCL (PF) 0.25 % IJ SOLN
INTRAMUSCULAR | Status: DC | PRN
  Administered 2022-01-16: 30 mL

## 2022-01-16 MED ORDER — SUGAMMADEX SODIUM 500 MG/5ML IV SOLN
INTRAVENOUS | Status: DC | PRN
  Administered 2022-01-16: 200 mg via INTRAVENOUS

## 2022-01-16 MED ORDER — KETOROLAC TROMETHAMINE 15 MG/ML IJ SOLN
30.0000 mg | INTRAMUSCULAR | Status: AC
  Administered 2022-01-16: 30 mg via INTRAVENOUS

## 2022-01-16 MED ORDER — DOCUSATE SODIUM 100 MG PO CAPS: 100.0000 mg | ORAL_CAPSULE | Freq: Two times a day (BID) | ORAL | 0 refills | Status: AC

## 2022-01-16 MED ORDER — ONDANSETRON HCL 4 MG PO TABS: 4.0000 mg | ORAL_TABLET | Freq: Three times a day (TID) | ORAL | 0 refills | Status: DC | PRN

## 2022-01-16 MED ORDER — ROCURONIUM BROMIDE 100 MG/10ML IV SOLN
INTRAVENOUS | Status: DC | PRN
  Administered 2022-01-16: 50 mg via INTRAVENOUS

## 2022-01-16 MED ORDER — ONDANSETRON HCL 4 MG/2ML IJ SOLN: 4.0000 mg | Freq: Once | INTRAMUSCULAR | Status: DC | PRN

## 2022-01-16 MED ORDER — CHLORHEXIDINE GLUCONATE 0.12 % MT SOLN: 15.0000 mL | Freq: Once | OROMUCOSAL | Status: AC

## 2022-01-16 MED ORDER — ACETIC ACID 4% SOLUTION
Status: DC | PRN
  Administered 2022-01-16: 1 via TOPICAL

## 2022-01-16 MED ORDER — FENTANYL CITRATE (PF) 100 MCG/2ML IJ SOLN
INTRAMUSCULAR | Status: DC | PRN
  Administered 2022-01-16: 50 ug via INTRAVENOUS

## 2022-01-16 MED ORDER — MIDAZOLAM HCL 2 MG/2ML IJ SOLN
INTRAMUSCULAR | Status: AC
  Filled 2022-01-16: qty 2

## 2022-01-16 MED ORDER — ORAL CARE MOUTH RINSE: 15.0000 mL | Freq: Once | OROMUCOSAL | Status: AC

## 2022-01-16 MED ORDER — FERRIC SUBSULFATE 259 MG/GM EX SOLN
CUTANEOUS | Status: AC
  Filled 2022-01-16: qty 8

## 2022-01-16 SURGICAL SUPPLY — 51 items
ADH SKN CLS APL DERMABOND .7 (GAUZE/BANDAGES/DRESSINGS) ×2
APL SWBSTK 6 STRL LF DISP (MISCELLANEOUS) ×2
APPLICATOR COTTON TIP 6 STRL (MISCELLANEOUS) ×3 IMPLANT
APPLICATOR COTTON TIP 6IN STRL (MISCELLANEOUS) ×3
BLADE SURG SZ11 CARB STEEL (BLADE) ×4 IMPLANT
CLOTH BEACON ORANGE TIMEOUT ST (SAFETY) ×4 IMPLANT
COVER LIGHT HANDLE STERIS (MISCELLANEOUS) ×2 IMPLANT
DERMABOND ADVANCED (GAUZE/BANDAGES/DRESSINGS) ×1
DERMABOND ADVANCED .7 DNX12 (GAUZE/BANDAGES/DRESSINGS) ×3 IMPLANT
DURAPREP 26ML APPLICATOR (WOUND CARE) ×4 IMPLANT
ELECT BALL LEEP 5MM RED (ELECTRODE) ×4 IMPLANT
ELECT LOOP LEEP RND 20X12 WHT (CUTTING LOOP) ×3
ELECT REM PT RETURN 9FT ADLT (ELECTROSURGICAL) ×3
ELECTRODE LOOP LP RND 20X12WHT (CUTTING LOOP) IMPLANT
ELECTRODE REM PT RTRN 9FT ADLT (ELECTROSURGICAL) ×3 IMPLANT
GAUZE 4X4 16PLY ~~LOC~~+RFID DBL (SPONGE) ×8 IMPLANT
GLOVE BIO SURGEON STRL SZ 6.5 (GLOVE) ×4 IMPLANT
GLOVE BIO SURGEON STRL SZ7 (GLOVE) ×4 IMPLANT
GLOVE BIOGEL PI IND STRL 6.5 (GLOVE) ×6 IMPLANT
GLOVE BIOGEL PI IND STRL 7.0 (GLOVE) ×15 IMPLANT
GLOVE BIOGEL PI INDICATOR 6.5 (GLOVE) ×2
GLOVE BIOGEL PI INDICATOR 7.0 (GLOVE) ×3
GLOVE SURG LTX SZ6.5 (GLOVE) ×4 IMPLANT
GOWN STRL REUS W/ TWL LRG LVL3 (GOWN DISPOSABLE) ×6 IMPLANT
GOWN STRL REUS W/TWL LRG LVL3 (GOWN DISPOSABLE) ×7 IMPLANT
KIT TURNOVER CYSTO (KITS) ×7 IMPLANT
NDL HYPO 25X1 1.5 SAFETY (NEEDLE) ×3 IMPLANT
NDL INSUFFLATION 14GA 120MM (NEEDLE) ×3 IMPLANT
NEEDLE HYPO 25X1 1.5 SAFETY (NEEDLE) ×6 IMPLANT
NEEDLE INSUFFLATION 14GA 120MM (NEEDLE) ×3 IMPLANT
NS IRRIG 1000ML POUR BTL (IV SOLUTION) ×4 IMPLANT
PACK PERI GYN (CUSTOM PROCEDURE TRAY) ×4 IMPLANT
PAD ARMBOARD 7.5X6 YLW CONV (MISCELLANEOUS) ×8 IMPLANT
PAD TELFA 3X4 1S STER (GAUZE/BANDAGES/DRESSINGS) ×4 IMPLANT
SCOPETTES 8  STERILE (MISCELLANEOUS) ×3
SCOPETTES 8 STERILE (MISCELLANEOUS) ×3 IMPLANT
SET BASIN LINEN APH (SET/KITS/TRAYS/PACK) ×4 IMPLANT
SET IRRIG TUBING LAPAROSCOPIC (IRRIGATION / IRRIGATOR) ×1 IMPLANT
SET TUBE SMOKE EVAC HIGH FLOW (TUBING) ×4 IMPLANT
SHEARS HARMONIC ACE PLUS 36CM (ENDOMECHANICALS) ×1 IMPLANT
SLEEVE ENDOPATH XCEL 5M (ENDOMECHANICALS) ×4 IMPLANT
SOL ANTI FOG 6CC (MISCELLANEOUS) IMPLANT
SOLUTION ANTI FOG 6CC (MISCELLANEOUS) ×1
SUT MON AB 4-0 PS1 27 (SUTURE) ×4 IMPLANT
SUT VIC AB 0 CT1 27 (SUTURE) ×3
SUT VIC AB 0 CT1 27XBRD ANBCTR (SUTURE) ×3 IMPLANT
SYR 10ML LL (SYRINGE) ×4 IMPLANT
SYR CONTROL 10ML LL (SYRINGE) ×4 IMPLANT
TROCAR XCEL NON BLADE 8MM B8LT (ENDOMECHANICALS) ×4 IMPLANT
TROCAR XCEL NON-BLD 5MMX100MML (ENDOMECHANICALS) ×4 IMPLANT
WARMER LAPAROSCOPE (MISCELLANEOUS) ×4 IMPLANT

## 2022-01-16 NOTE — Op Note (Signed)
PREOPERATIVE DIAGNOSIS:  Desires sterilization, CIN 2 ?POSTOPERATIVE DIAGNOSIS: same ?PROCEDURE PERFORMED: Laparoscopic bilateral salpingectomy, LEEP ?SURGEON: Dr. Myna Hidalgo ?ANESTHESIA: General endotracheal.  ?ESTIMATED BLOOD LOSS: 10cc.  ?IV FLUIDS: 800 cc of crystalloid.  ?SPECIMEN(S): bilateral fallopian tubes, cervical biopsy ?COMPLICATIONS: None.  ?CONDITION: Stable. ?  ?FINDINGS: No ascites or peritoneal studding was appreciated.  Small omental adhesion to anterior abdominal wall, otherwise, bowel appeared grossly normal.  Uterus normal size and shape.  Ovaries and tubes were normal in appearance.   ?  ?Informed consent was obtained from the patient prior to taking her to the operating room where anesthesia was found to be adequate. She was placed in dorsal lithotomy position and examined under anesthesia. She was prepped and draped in normal sterile fashion.  ?A bi-valve speculum was then placed and the anterior lip of the cervix was grasped with the single tooth tenaculum. The hulka uterine manipulator was then advanced into the uterus to provide uterine mobility. The speculum and tenaculum were then removed.  ?Attention was then turned to the patients abdomen, 10cc of 0.25% Marcaine was inserted.  A 5 mm supraumbilical skin incision was made with the scalpel. The veress needle was carefully introduced into the peritoneal cavity while tenting the abdominal wall. Intraperitoneal placement was confirmed by use of a saline-drop test.  The gas was connected and confirmed intrabdominal placement by a low initial pressure of . The abdomen was then insuflated with CO2 gas. The trocar and sleeve were then advanced without difficulty into the abdomen under direct visualization. Intraabdominal placement was confirmed by the laparoscope and surveillance of the abdomen was performed. Grossly normal appearing abdomen as mentioned in findings above.  ?Two additional ports were placed.  Each area was injected  with 10cc of local. A 72mm trocar on her left lower quadrant and an 67mm at her right lower quadrent.  Each trocar was placed under direct visualization.    ?  ?The right fallopian tube was placed on tension and using the Harmonic, the tube was excised and removed through the port under direct visualization.  A similar procedure was performed on the left with removal of the fallopian tube.  A small area of spotting was noted near the cornua, hemostasis was obtained with the harmonic.  Arista was placed.  Excellent hemostasis was noted.  The instruments were then removed from the patients abdomen with air allowed to fully escape. The right port site was closed with monocryl and all sites were closed with dermabond.   ? ?Attention was then turned to the vagina.  The manipulator was removed from the cervix with no lacerations or bleeding identified. Acetic acid solution was applied to the cervix and areas of decreased uptake were noted around the transformation zone.   Local anesthesia was administered via an intracervical block using 20 ml of 0.5% Lidocaine with epinephrine. The suction was turned on and the Medium 1X Fisher Cone Biopsy Excisor on 50 Watts of blended current was used to excise the area of decreased uptake and excise the entire transformation zone. Excellent hemostasis was achieved using roller ball coagulation set at 60 Watts coagulation current. Monsel's solution was then applied and the speculum was removed from the vagina. Specimens were sent to pathology. ? ?The patient tolerated the procedure well with all sponge, lap, and needle counts correct. The patient was taken to recovery in stable condition. Foley was removed at the end of the procedure. ? ?Myna Hidalgo, DO ?Attending Obstetrician & Gynecologist, Faculty Practice ?Center for [facility]  [facility], [facility] ?  ?

## 2022-01-16 NOTE — Anesthesia Preprocedure Evaluation (Signed)
Anesthesia Evaluation  ?Patient identified by MRN, date of birth, ID band ?Patient awake ? ? ? ?Reviewed: ?Allergy & Precautions, NPO status , Patient's Chart, lab work & pertinent test results ? ?Airway ?Mallampati: II ? ?TM Distance: >3 FB ?Neck ROM: Full ? ? ? Dental ? ?(+) Dental Advisory Given, Missing, Poor Dentition, Chipped ?  ?Pulmonary ?Current Smoker and Patient abstained from smoking.,  ?  ?Pulmonary exam normal ?breath sounds clear to auscultation ? ? ? ? ? ? Cardiovascular ?negative cardio ROS ?Normal cardiovascular exam ?Rhythm:Regular Rate:Normal ? ? ?  ?Neuro/Psych ? Headaches, Seizures - ( from abrupt klonopin w/d), Well Controlled,  PSYCHIATRIC DISORDERS Anxiety Depression   ? GI/Hepatic ?negative GI ROS, (+)  ?  ? substance abuse (last use 2 weeks ago) ? marijuana use,   ?Endo/Other  ?negative endocrine ROS ? Renal/GU ?Renal disease (stones )  ?negative genitourinary ?  ?Musculoskeletal ?negative musculoskeletal ROS ?(+)  ? Abdominal ?  ?Peds ?negative pediatric ROS ?(+)  Hematology ?negative hematology ROS ?(+)   ?Anesthesia Other Findings ? ? Reproductive/Obstetrics ?negative OB ROS ? ?  ? ? ? ? ? ? ? ? ? ? ? ? ? ?  ?  ? ? ? ? ? ? ? ?Anesthesia Physical ?Anesthesia Plan ? ?ASA: 2 ? ?Anesthesia Plan: General  ? ?Post-op Pain Management: Dilaudid IV  ? ?Induction: Intravenous ? ?PONV Risk Score and Plan: 4 or greater and Ondansetron, Dexamethasone and Midazolam ? ?Airway Management Planned: Oral ETT ? ?Additional Equipment:  ? ?Intra-op Plan:  ? ?Post-operative Plan: Extubation in OR ? ?Informed Consent: I have reviewed the patients History and Physical, chart, labs and discussed the procedure including the risks, benefits and alternatives for the proposed anesthesia with the patient or authorized representative who has indicated his/her understanding and acceptance.  ? ? ? ?Dental advisory given ? ?Plan Discussed with: CRNA and Surgeon ? ?Anesthesia Plan  Comments:   ? ? ? ? ? ? ?Anesthesia Quick Evaluation ? ?

## 2022-01-16 NOTE — Anesthesia Postprocedure Evaluation (Signed)
Anesthesia Post Note ? ?Patient: Roberta Baker ? ?Procedure(s) Performed: LAPAROSCOPIC BILATERAL SALPINGECTOMY (Bilateral: Abdomen) ?LOOP ELECTROSURGICAL EXCISION PROCEDURE (LEEP) (Cervix) ? ?Patient location during evaluation: Phase II ?Anesthesia Type: General ?Level of consciousness: awake and alert and oriented ?Pain management: pain level controlled ?Vital Signs Assessment: post-procedure vital signs reviewed and stable ?Respiratory status: spontaneous breathing, nonlabored ventilation and respiratory function stable ?Cardiovascular status: blood pressure returned to baseline and stable ?Postop Assessment: no apparent nausea or vomiting ?Anesthetic complications: no ? ? ?No notable events documented. ? ? ?Last Vitals:  ?Vitals:  ? 01/16/22 0900 01/16/22 0918  ?BP: 94/72 98/63  ?Pulse: 85 73  ?Resp: 12 16  ?Temp:  (!) 36.4 ?C  ?SpO2: 98% 97%  ?  ?Last Pain:  ?Vitals:  ? 01/16/22 0918  ?TempSrc: Oral  ?PainSc: 4   ? ? ?  ?  ?  ?  ?  ?  ? ?Jibran Crookshanks C Makaiyah Schweiger ? ? ? ? ?

## 2022-01-16 NOTE — Anesthesia Procedure Notes (Signed)
Procedure Name: Intubation ?Date/Time: 01/16/2022 7:44 AM ?Performed by: Jonna Munro, CRNA ?Pre-anesthesia Checklist: Patient identified, Emergency Drugs available, Suction available, Patient being monitored and Timeout performed ?Patient Re-evaluated:Patient Re-evaluated prior to induction ?Oxygen Delivery Method: Circle system utilized ?Preoxygenation: Pre-oxygenation with 100% oxygen ?Induction Type: IV induction ?Ventilation: Mask ventilation without difficulty ?Laryngoscope Size: Mac and 3 ?Grade View: Grade I ?Tube type: Oral ?Tube size: 7.0 mm ?Number of attempts: 1 ?Airway Equipment and Method: Stylet ?Placement Confirmation: positive ETCO2, breath sounds checked- equal and bilateral and ETT inserted through vocal cords under direct vision ?Secured at: 22 cm ?Tube secured with: Tape ?Dental Injury: Teeth and Oropharynx as per pre-operative assessment  ? ? ? ? ?

## 2022-01-16 NOTE — Discharge Instructions (Signed)
HOME INSTRUCTIONS ? ?Please note any unusual or excessive bleeding, pain, swelling. Mild dizziness or drowsiness are normal for about 24 hours after surgery. ?  ?Shower when comfortable ? ?Restrictions: No driving for 24 hours or while taking pain medications. ? ?Activity:  No heavy lifting (> 10 lbs), nothing in vagina (no tampons, douching, or intercourse) x 4 weeks; no tub baths for 4 weeks ?Vaginal spotting is expected but if your bleeding is heavy, period like,  please call the office ?  ?Incision: the bandaids will fall off when they are ready to; you may clean your incision with mild soap and water but do not rub or scrub the incision site.  You may experience slight bloody drainage from your incision periodically.  This is normal.  If you experience a large amount of drainage or the incision opens, please call your physician who will likely direct you to the emergency department. ? ?Diet:  You may return to your regular diet.  Do not eat large meals.  Eat small frequent meals throughout the day.  Continue to drink a good amount of water at least 6-8 glasses of water per day, hydration is very important for the healing process. ? ?Pain Management: Take Ibuprofen every 6 hours with food as needed for pain.  For moderate to severe pain, a prescription of percocet has been sent in.  You can alternate this medication with the ibuprofen.   ?-If the Percocet is too strong, switch to over the counter tylenol instead.   ?-Do not take Percocet (oxycodone/acetaminophen) and Tylenol (acetaminophen) together (percocet has tylenol in it). ? ?Always take prescription pain medication with food.  Percocet may cause constipation, you may want to take a stool softener while taking this medication.  A prescription of colace has been sent in to take twice daily if needed while taking the oxycodone.  Be sure to drink plenty of fluids and increase your fiber to help with constipation. ? ?Alcohol -- Avoid for 24 hours and while  taking pain medications. ? ?Nausea: Take sips of ginger ale or soda.  A prescription of ondansetron (zofran) has also been sent in to take if needed. ? ?Fever -- Call physician if temperature over 101 degrees ? ?Follow up:  If you do not already have a follow up appointment scheduled, please call the office at [phone].  If you experience fever (a temperature greater than 100.4), pain unrelieved by pain medication, shortness of breath, swelling of a single leg, or any other symptoms which are concerning to you please the office immediately.  ?

## 2022-01-16 NOTE — Transfer of Care (Signed)
Immediate Anesthesia Transfer of Care Note ? ?Patient: Roberta Baker ? ?Procedure(s) Performed: LAPAROSCOPIC BILATERAL SALPINGECTOMY (Bilateral: Abdomen) ?LOOP ELECTROSURGICAL EXCISION PROCEDURE (LEEP) (Cervix) ? ?Patient Location: PACU ? ?Anesthesia Type:General ? ?Level of Consciousness: awake, alert , oriented and patient cooperative ? ?Airway & Oxygen Therapy: Patient Spontanous Breathing ? ?Post-op Assessment: Report given to RN, Post -op Vital signs reviewed and stable and Patient moving all extremities X 4 ? ?Post vital signs: Reviewed and stable ? ?Last Vitals:  ?Vitals Value Taken Time  ?BP 107/65 01/16/22 0845  ?Temp    ?Pulse 95 01/16/22 0846  ?Resp 14 01/16/22 0846  ?SpO2 99 % 01/16/22 0846  ?Vitals shown include unvalidated device data. ? ?Last Pain:  ?Vitals:  ? 01/16/22 0631  ?TempSrc: Oral  ?PainSc: 0-No pain  ?   ? ?  ? ?Complications: No notable events documented. ?

## 2022-01-17 ENCOUNTER — Other Ambulatory Visit: Payer: Self-pay | Admitting: Obstetrics & Gynecology

## 2022-01-17 ENCOUNTER — Encounter (HOSPITAL_COMMUNITY): Payer: Self-pay | Admitting: Obstetrics & Gynecology

## 2022-01-17 LAB — SURGICAL PATHOLOGY

## 2022-01-17 MED ORDER — PROMETHAZINE HCL 25 MG PO TABS
12.5000 mg | ORAL_TABLET | Freq: Four times a day (QID) | ORAL | 0 refills | Status: DC | PRN
Start: 2022-01-17 — End: 2022-04-29

## 2022-01-24 ENCOUNTER — Ambulatory Visit (INDEPENDENT_AMBULATORY_CARE_PROVIDER_SITE_OTHER): Payer: Medicaid Other | Admitting: Obstetrics & Gynecology

## 2022-01-24 ENCOUNTER — Encounter: Payer: Self-pay | Admitting: Obstetrics & Gynecology

## 2022-01-24 ENCOUNTER — Encounter: Payer: Medicaid Other | Admitting: Obstetrics & Gynecology

## 2022-01-24 ENCOUNTER — Ambulatory Visit: Payer: Medicaid Other

## 2022-01-24 VITALS — BP 110/71 | HR 95 | Ht 60.0 in | Wt 144.6 lb

## 2022-01-24 DIAGNOSIS — Z9851 Tubal ligation status: Secondary | ICD-10-CM

## 2022-01-24 NOTE — Progress Notes (Signed)
    PostOp Visit Note  Roberta Baker is a 36 y.o. G42P2002 female who presents for a postoperative visit. She is 1 week postop following a Laparoscopic bilateral salpingectomy completed on 5/   Today she notes that she is doing well.  Still taking the pain medication, but is controlling her pain. Denies fever or chills.  Tolerating gen diet.  +Flatus, Regular BMs.   Overall doing well and reports no acute complaints   Review of Systems Pertinent items noted in HPI and remainder of comprehensive ROS otherwise negative.    Objective:  BP 110/71 (BP Location: Right Arm, Patient Position: Sitting, Cuff Size: Normal)   Pulse 95   Ht 5' (1.524 m)   Wt 144 lb 9.6 oz (65.6 kg)   LMP 12/29/2021 (Exact Date)   BMI 28.24 kg/m    Physical Examination:  GENERAL ASSESSMENT: well developed and well nourished SKIN: warm and dry CHEST: CTAB HEART: regular rate and rhythm ABDOMEN: soft, non-distended, +BS INCISION: well healed- C/D/I EXTREMITY: no edema, no calf tenderness bilaterally PSYCH: mood appropriate, normal affect       Assessment:    S/p laparoscopic bilateral salpingectomy   Plan:   -meeting postop milestones appropriately -reviewed activity level and recovery -f/u prn or 42yr for annual  Myna Hidalgo, DO Attending Obstetrician & Gynecologist, [facility] [employer], [facility]

## 2022-01-31 ENCOUNTER — Encounter: Payer: Self-pay | Admitting: Obstetrics & Gynecology

## 2022-02-18 ENCOUNTER — Encounter: Payer: Self-pay | Admitting: Nurse Practitioner

## 2022-02-25 ENCOUNTER — Emergency Department (HOSPITAL_COMMUNITY)
Admission: EM | Admit: 2022-02-25 | Discharge: 2022-02-26 | Disposition: A | Discharge status: Home / Self Care | Payer: Medicaid Other | Attending: Emergency Medicine | Admitting: Emergency Medicine

## 2022-02-25 DIAGNOSIS — K0889 Other specified disorders of teeth and supporting structures: Secondary | ICD-10-CM | POA: Diagnosis present

## 2022-02-25 DIAGNOSIS — K029 Dental caries, unspecified: Secondary | ICD-10-CM | POA: Insufficient documentation

## 2022-02-26 ENCOUNTER — Encounter (HOSPITAL_COMMUNITY): Payer: Self-pay | Admitting: Emergency Medicine

## 2022-02-26 ENCOUNTER — Encounter: Payer: Self-pay | Admitting: Nurse Practitioner

## 2022-02-26 ENCOUNTER — Ambulatory Visit: Payer: Medicaid Other | Admitting: Nurse Practitioner

## 2022-02-26 VITALS — BP 110/77 | HR 110 | Temp 98.4°F | Ht 60.0 in | Wt 141.2 lb

## 2022-02-26 DIAGNOSIS — F418 Other specified anxiety disorders: Secondary | ICD-10-CM

## 2022-02-26 DIAGNOSIS — R002 Palpitations: Secondary | ICD-10-CM

## 2022-02-26 MED ORDER — KETOROLAC TROMETHAMINE 60 MG/2ML IM SOLN
30.0000 mg | Freq: Once | INTRAMUSCULAR | Status: AC
  Administered 2022-02-26: 30 mg via INTRAMUSCULAR
  Filled 2022-02-26: qty 2

## 2022-02-26 MED ORDER — OXYCODONE-ACETAMINOPHEN 5-325 MG PO TABS
2.0000 | ORAL_TABLET | Freq: Once | ORAL | Status: AC
  Administered 2022-02-26: 2 via ORAL
  Filled 2022-02-26: qty 2

## 2022-02-26 MED ORDER — IBUPROFEN 800 MG PO TABS: 800.0000 mg | ORAL_TABLET | Freq: Three times a day (TID) | ORAL | 0 refills | Status: DC

## 2022-02-26 MED ORDER — PENICILLIN V POTASSIUM 500 MG PO TABS: 500.0000 mg | ORAL_TABLET | Freq: Four times a day (QID) | ORAL | 0 refills | Status: AC

## 2022-02-26 MED ORDER — ALPRAZOLAM 0.5 MG PO TABS: 0.5000 mg | ORAL_TABLET | Freq: Every day | ORAL | 0 refills | Status: DC | PRN

## 2022-02-26 MED ORDER — PENICILLIN V POTASSIUM 250 MG PO TABS
500.0000 mg | ORAL_TABLET | Freq: Once | ORAL | Status: AC
  Administered 2022-02-26: 500 mg via ORAL
  Filled 2022-02-26: qty 2

## 2022-02-26 MED ORDER — DULOXETINE HCL 30 MG PO CPEP: 30.0000 mg | ORAL_CAPSULE | Freq: Every day | ORAL | 2 refills | Status: DC

## 2022-02-26 NOTE — ED Notes (Signed)
Went over dc papers. All questions answered. Ambulatory to lobby with family.  

## 2022-03-18 ENCOUNTER — Other Ambulatory Visit: Payer: Self-pay

## 2022-03-18 ENCOUNTER — Emergency Department (HOSPITAL_COMMUNITY)
Admission: EM | Admit: 2022-03-18 | Discharge: 2022-03-19 | Disposition: A | Discharge status: Home / Self Care | Payer: Medicaid Other | Attending: Emergency Medicine | Admitting: Emergency Medicine

## 2022-03-18 ENCOUNTER — Encounter (HOSPITAL_COMMUNITY): Payer: Self-pay | Admitting: Emergency Medicine

## 2022-03-18 DIAGNOSIS — K0889 Other specified disorders of teeth and supporting structures: Secondary | ICD-10-CM | POA: Diagnosis present

## 2022-03-18 NOTE — ED Triage Notes (Signed)
Pt with c/o L upper dental pain x 2 days.

## 2022-03-19 MED ORDER — AMOXICILLIN 500 MG PO CAPS: 1000.0000 mg | ORAL_CAPSULE | Freq: Two times a day (BID) | ORAL | 0 refills | Status: DC

## 2022-03-19 MED ORDER — IBUPROFEN 400 MG PO TABS
400.0000 mg | ORAL_TABLET | Freq: Once | ORAL | Status: AC
  Administered 2022-03-19: 400 mg via ORAL
  Filled 2022-03-19: qty 1

## 2022-03-19 MED ORDER — HYDROCODONE-ACETAMINOPHEN 5-325 MG PO TABS
1.0000 | ORAL_TABLET | Freq: Once | ORAL | Status: AC
  Administered 2022-03-19: 1 via ORAL
  Filled 2022-03-19: qty 1

## 2022-03-19 MED ORDER — HYDROCODONE-ACETAMINOPHEN 5-325 MG PO TABS: 1.0000 | ORAL_TABLET | ORAL | 0 refills | Status: DC | PRN

## 2022-03-19 MED ORDER — AMOXICILLIN 250 MG PO CAPS
1000.0000 mg | ORAL_CAPSULE | Freq: Once | ORAL | Status: AC
Start: 2022-03-19 — End: 2022-03-19
  Administered 2022-03-19: 1000 mg via ORAL
  Filled 2022-03-19: qty 4

## 2022-03-19 NOTE — ED Provider Notes (Signed)
[name] EMERGENCY DEPARTMENT Provider Note   CSN: [id number] Arrival date & time: 03/18/22  2325     History  Chief Complaint  Patient presents with   Dental Pain    Roberta Baker is a 36 y.o. female.  The history is provided by the patient.  Dental Pain She complains of pain in her left upper tooth for the last 2 days.  Pain is severe, not relieved with acetaminophen.  She states that she has [employer] and has not been able to get into see a dentist.   Home Medications Prior to Admission medications   Medication Sig Start Date End Date Taking? Authorizing Provider  ALPRAZolam Prudy Feeler) 0.5 MG tablet Take 1 tablet (0.5 mg total) by mouth daily as needed for anxiety. 02/26/22   Ameduite, Alvino Chapel, NP  diphenhydrAMINE (BENADRYL) 25 MG tablet Take 25 mg by mouth 2 (two) times daily as needed (itchy eyes).    [provider]  DULoxetine (CYMBALTA) 30 MG capsule Take 1 capsule (30 mg total) by mouth daily. 02/26/22   Ameduite, Alvino Chapel, NP  ibuprofen (ADVIL) 800 MG tablet Take 1 tablet (800 mg total) by mouth 3 (three) times daily. 02/26/22   Mesner, Barbara Cower, MD  promethazine (PHENERGAN) 25 MG tablet Take 0.5-1 tablets (12.5-25 mg total) by mouth every 6 (six) hours as needed for nausea or vomiting. 01/17/22   Myna Hidalgo, DO  tetrahydrozoline-zinc (VISINE-AC) 0.05-0.25 % ophthalmic solution Place 2 drops into both eyes 3 (three) times daily as needed (itchy eyes).    [provider]      Allergies    Buspar [buspirone] and Sulfa antibiotics    Review of Systems   Review of Systems  All other systems reviewed and are negative.   Physical Exam Updated Vital Signs BP (!) 116/50   Pulse 76   Temp 97.8 F (36.6 C) (Oral)   Resp 18   Ht 5' (1.524 m)   Wt 64 kg   SpO2 99%   BMI 27.56 kg/m  Physical Exam Vitals and nursing note reviewed.   36 year old female, pacing the floor and obviously uncomfortable, but in no acute distress. Vital signs  are normal. Oxygen saturation is 99%, which is normal. Head is normocephalic and atraumatic. PERRLA, EOMI. Mouth shows no obvious caries, but there is tenderness to percussion on tooth #12.  There is no obvious gingival swelling, pallor, erythema, but I suspect a dental abscess. Neck is nontender and supple without adenopathy or JVD. Back is nontender and there is no CVA tenderness. Lungs are clear without rales, wheezes, or rhonchi. Chest is nontender. Heart has regular rate and rhythm without murmur. Abdomen is soft, flat, nontender. Extremities have no cyanosis or edema, full range of motion is present. Skin is warm and dry without rash. Neurologic: Mental status is normal, cranial nerves are intact, moves all extremities equally.  ED Results / Procedures / Treatments    Procedures Procedures    Medications Ordered in ED Medications  amoxicillin (AMOXIL) capsule 1,000 mg (has no administration in time range)  HYDROcodone-acetaminophen (NORCO/VICODIN) 5-325 MG per tablet 1 tablet (has no administration in time range)  ibuprofen (ADVIL) tablet 400 mg (has no administration in time range)    ED Course/ Medical Decision Making/ A&P                           Medical Decision Making  Dental pain, probable abscess of tooth #12.  Old records are reviewed, and she has numerous visits for dental pain, And had been seen in the ED on 02/25/2022 for pain and a right lower tooth.  I have ordered a dose of hydrocodone-acetaminophen and ibuprofen, and a dose of amoxicillin.  She is discharged with prescription for amoxicillin, told to use over-the-counter acetaminophen and ibuprofen as needed for pain.  She is given a take-home pack of hydrocodone-acetaminophen.  She is referred to on-call dentistry.  Final Clinical Impression(s) / ED Diagnoses Final diagnoses:  Pain, dental    Rx / DC Orders ED Discharge Orders          Ordered    amoxicillin (AMOXIL) 500 MG capsule  2 times daily         03/19/22 0122    HYDROcodone-acetaminophen (NORCO) 5-325 MG tablet  Every 4 hours PRN        03/19/22 0125              Dione Booze, MD 03/19/22 [phone]

## 2022-03-19 NOTE — Discharge Instructions (Signed)
Please take ibuprofen and/or acetaminophen as needed for pain.  Please be aware that when you combine ibuprofen and acetaminophen, you get better pain relief and you get from taking either medication by itself.  Please see the dentist for definitive care of your tooth.

## 2022-03-20 MED FILL — Hydrocodone-Acetaminophen Tab 5-325 MG: ORAL | Qty: 6 | Status: AC

## 2022-03-28 ENCOUNTER — Encounter: Payer: Self-pay | Admitting: Nurse Practitioner

## 2022-04-02 ENCOUNTER — Encounter: Payer: Self-pay | Admitting: Nurse Practitioner

## 2022-04-03 ENCOUNTER — Other Ambulatory Visit: Payer: Self-pay | Admitting: Nurse Practitioner

## 2022-04-03 DIAGNOSIS — F418 Other specified anxiety disorders: Secondary | ICD-10-CM

## 2022-04-03 MED ORDER — ALPRAZOLAM 0.5 MG PO TABS: 0.5000 mg | ORAL_TABLET | Freq: Every day | ORAL | 0 refills | Status: DC | PRN

## 2022-04-04 ENCOUNTER — Other Ambulatory Visit: Payer: Self-pay | Admitting: Nurse Practitioner

## 2022-04-04 DIAGNOSIS — F418 Other specified anxiety disorders: Secondary | ICD-10-CM

## 2022-04-16 ENCOUNTER — Encounter: Payer: Self-pay | Admitting: Nurse Practitioner

## 2022-04-17 ENCOUNTER — Other Ambulatory Visit: Payer: Self-pay | Admitting: Nurse Practitioner

## 2022-04-17 DIAGNOSIS — F418 Other specified anxiety disorders: Secondary | ICD-10-CM

## 2022-04-29 ENCOUNTER — Ambulatory Visit
Admission: EM | Admit: 2022-04-29 | Discharge: 2022-04-29 | Disposition: A | Discharge status: Home / Self Care | Payer: Medicaid Other | Attending: Family Medicine | Admitting: Family Medicine

## 2022-04-29 DIAGNOSIS — R110 Nausea: Secondary | ICD-10-CM | POA: Diagnosis not present

## 2022-04-29 DIAGNOSIS — R630 Anorexia: Secondary | ICD-10-CM

## 2022-04-29 LAB — POCT URINALYSIS DIP (MANUAL ENTRY)
Bilirubin, UA: NEGATIVE
Glucose, UA: NEGATIVE mg/dL
Ketones, POC UA: NEGATIVE mg/dL
Nitrite, UA: NEGATIVE
Protein Ur, POC: NEGATIVE mg/dL
Spec Grav, UA: 1.01 (ref 1.010–1.025)
Urobilinogen, UA: 0.2 E.U./dL
pH, UA: 7 (ref 5.0–8.0)

## 2022-04-29 MED ORDER — PROMETHAZINE HCL 25 MG PO TABS: 12.5000 mg | ORAL_TABLET | Freq: Four times a day (QID) | ORAL | 0 refills | Status: DC | PRN

## 2022-04-29 NOTE — ED Triage Notes (Signed)
Pt reports low appetite and nausea x 4 days.

## 2022-04-29 NOTE — ED Provider Notes (Signed)
[facility]    CSN: [id number] Arrival date & time: 04/29/22  3532      History   Chief Complaint Chief Complaint  Patient presents with   Dizziness   Nausea    HPI Roberta Baker is a 36 y.o. female.   Patient presenting today with 4-day history of decreased appetite, nausea, fatigue.  Denies vomiting, diarrhea constipation, abdominal pain, fever, chills, body aches, upper respiratory symptoms, urinary or vaginal symptoms.  States her anxiety tends to make her nauseous as well as her anxiety medication Cymbalta.  She typically takes Phenergan in the mornings with her anxiety medication but ran out several days ago.  She is also recently stopped smoking, unsure if this is got anything to do with her symptoms.  No recent travel, no known sick contacts, no new foods tried recently.  Not taking anything over-the-counter for symptoms.  Tolerating p.o.  History of bilateral salpingectomy earlier this year so no concern for pregnancy.  No known chronic GI issues.    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/2016   Depression with anxiety 12/12/2016    Past Surgical History:  Procedure Laterality Date   BREAST ENHANCEMENT SURGERY     BREAST SURGERY     LAPAROSCOPIC BILATERAL SALPINGECTOMY Bilateral 01/16/2022   Procedure: LAPAROSCOPIC BILATERAL SALPINGECTOMY;  Surgeon: Myna Hidalgo, DO;  Location: [facility];  Service: Gynecology;  Laterality: Bilateral;   LEEP N/A 01/16/2022   Procedure:  LOOP ELECTROSURGICAL EXCISION PROCEDURE (LEEP);  Surgeon: Myna Hidalgo, DO;  Location: [facility];  Service: Gynecology;  Laterality: N/A;    OB History     Gravida  2   Para  2   Term  2   Preterm      AB  0   Living  2      SAB      IAB      Ectopic      Multiple  0   Live Births  2            Home Medications    Prior to Admission medications   Medication Sig Start Date End Date Taking? Authorizing Provider  ALPRAZolam Prudy Feeler) 0.5 MG tablet Take 1 tablet (0.5 mg total) by mouth daily as needed for anxiety. 04/03/22   Ameduite, Alvino Chapel, NP  amoxicillin (AMOXIL) 500 MG capsule Take 2 capsules (1,000 mg total) by mouth 2 (two) times daily. 03/19/22   Dione Booze, MD  diphenhydrAMINE (BENADRYL) 25 MG tablet Take 25 mg by mouth 2 (two) times daily as needed (itchy eyes).    [provider]  DULoxetine (CYMBALTA) 30 MG capsule Take 1 capsule (30 mg total) by mouth daily. 02/26/22   Ameduite, Alvino Chapel, NP  HYDROcodone-acetaminophen (NORCO) 5-325 MG tablet Take 1 tablet by mouth every 4 (four) hours as needed for moderate pain. 03/19/22   Dione Booze, MD  ibuprofen (ADVIL) 800 MG tablet Take 1 tablet (800 mg total) by mouth 3 (three) times daily. 02/26/22   Mesner, Barbara Cower, MD  promethazine (PHENERGAN) 25 MG tablet Take 0.5-1 tablets (12.5-25 mg total) by mouth every 6 (six) hours as needed for nausea or vomiting. 04/29/22   Particia Nearing, PA-C  tetrahydrozoline-zinc (VISINE-AC) 0.05-0.25 % ophthalmic solution Place 2 drops into both eyes 3 (three) times daily as needed (itchy eyes).    [provider]    Family History Family History  Problem Relation Age of Onset   Cancer Maternal Grandmother        breast cancer   Heart attack Maternal Grandfather    Mental illness Mother    Seizures Mother    Cancer Maternal Uncle     Social History Social History   Tobacco Use   Smoking status: Every Day    Packs/day: 0.75    Years: 0.50    Total  pack years: 0.38    Types: Cigarettes   Smokeless tobacco: Never  Vaping Use   Vaping Use: Never used  Substance Use Topics   Alcohol use: No   Drug use: Not Currently    Types: Marijuana    Comment: occasional     Allergies   Buspar [buspirone] and Sulfa antibiotics   Review of Systems Review of Systems Per HPI  Physical Exam Triage Vital Signs ED Triage Vitals  Enc Vitals Group     BP 04/29/22 0913 126/86     Pulse Rate 04/29/22 0913 79     Resp 04/29/22 0913 16     Temp 04/29/22 0913 98.7 F (37.1 C)     Temp Source 04/29/22 0913 Oral     SpO2 04/29/22 0913 96 %     Weight --      Height --      Head Circumference --      Peak Flow --      Pain Score 04/29/22 0912 0     Pain Loc --      Pain Edu? --      Excl. in [place]? --    No data found.  Updated Vital Signs BP 126/86 (BP Location: Right Arm)   Pulse 79   Temp 98.7 F (37.1 C) (Oral)   Resp 16   LMP  (Within Months) Comment: 1 month  SpO2 96%   Breastfeeding No   Visual Acuity Right Eye Distance:   Left Eye Distance:   Bilateral Distance:    Right Eye Near:   Left Eye Near:    Bilateral Near:     Physical Exam Vitals and nursing note reviewed.  Constitutional:      Appearance: Normal appearance. She is not ill-appearing.  HENT:     Head: Atraumatic.     Mouth/Throat:     Mouth: Mucous membranes are moist.  Eyes:     Extraocular Movements: Extraocular movements intact.     Conjunctiva/sclera: Conjunctivae normal.  Cardiovascular:     Rate and Rhythm: Normal rate and regular rhythm.     Heart sounds: Normal heart sounds.  Pulmonary:     Effort: Pulmonary effort is normal.     Breath sounds: Normal breath sounds.  Abdominal:     General: Bowel sounds are normal. There is no distension.     Palpations: Abdomen is soft.     Tenderness: There is no abdominal tenderness. There is no right CVA tenderness, left CVA tenderness or guarding.  Musculoskeletal:  General: Normal range of  motion.     Cervical back: Normal range of motion and neck supple.  Skin:    General: Skin is warm and dry.  Neurological:     Mental Status: She is alert and oriented to person, place, and time.     Motor: No weakness.     Gait: Gait normal.  Psychiatric:        Mood and Affect: Mood normal.        Thought Content: Thought content normal.        Judgment: Judgment normal.      UC Treatments / Results  Labs (all labs ordered are listed, but only abnormal results are displayed) Labs Reviewed  POCT URINALYSIS DIP (MANUAL ENTRY) - Abnormal; Notable for the following components:      Result Value   Blood, UA trace-intact (*)    Leukocytes, UA Trace (*)    All other components within normal limits    EKG   Radiology No results found.  Procedures Procedures (including critical care time)  Medications Ordered in UC Medications - No data to display  Initial Impression / Assessment and Plan / UC Course  I have reviewed the triage vital signs and the nursing notes.  Pertinent labs & imaging results that were available during my care of the patient were reviewed by me and considered in my medical decision making (see chart for details).     Vital signs and exam all very reassuring, urinalysis that evidence of urinary tract infection.  Possibly viral versus related to anxiety and being out of her typical Phenergan.  Will refill her Phenergan, discussed brat diet, rest, fluids.  She is requesting a work note for today that she missed this week due to her symptoms, this was provided.  Return precautions reviewed at length.  Final Clinical Impressions(s) / UC Diagnoses   Final diagnoses:  Nausea without vomiting  Decreased appetite   Discharge Instructions   None    ED Prescriptions     Medication Sig Dispense Auth. Provider   promethazine (PHENERGAN) 25 MG tablet Take 0.5-1 tablets (12.5-25 mg total) by mouth every 6 (six) hours as needed for nausea or vomiting. 30  tablet Particia Nearing, [place]      PDMP not reviewed this encounter.   Particia Nearing, [place] 04/29/22 1005

## 2022-05-07 ENCOUNTER — Other Ambulatory Visit: Payer: Self-pay | Admitting: Nurse Practitioner

## 2022-05-07 DIAGNOSIS — F418 Other specified anxiety disorders: Secondary | ICD-10-CM

## 2022-05-07 MED ORDER — DULOXETINE HCL 30 MG PO CPEP: 60.0000 mg | ORAL_CAPSULE | Freq: Every day | ORAL | 2 refills | Status: DC

## 2022-05-14 ENCOUNTER — Telehealth: Payer: Self-pay

## 2022-05-14 ENCOUNTER — Other Ambulatory Visit: Payer: Self-pay | Admitting: Nurse Practitioner

## 2022-05-14 ENCOUNTER — Other Ambulatory Visit: Payer: Self-pay

## 2022-05-14 DIAGNOSIS — F418 Other specified anxiety disorders: Secondary | ICD-10-CM

## 2022-05-21 ENCOUNTER — Other Ambulatory Visit: Payer: Self-pay | Admitting: Nurse Practitioner

## 2022-05-22 ENCOUNTER — Other Ambulatory Visit: Payer: Self-pay

## 2022-05-22 DIAGNOSIS — F418 Other specified anxiety disorders: Secondary | ICD-10-CM

## 2022-05-22 NOTE — Telephone Encounter (Signed)
Per referral coordinator the referral is in old Q, I did reorder it .

## 2022-05-26 ENCOUNTER — Encounter: Payer: Self-pay | Admitting: Emergency Medicine

## 2022-05-26 ENCOUNTER — Other Ambulatory Visit: Payer: Self-pay

## 2022-05-26 ENCOUNTER — Ambulatory Visit
Admission: EM | Admit: 2022-05-26 | Discharge: 2022-05-26 | Disposition: A | Discharge status: Home / Self Care | Payer: Medicaid Other | Attending: Family Medicine | Admitting: Family Medicine

## 2022-05-26 DIAGNOSIS — Z1152 Encounter for screening for COVID-19: Secondary | ICD-10-CM | POA: Diagnosis not present

## 2022-05-26 DIAGNOSIS — R112 Nausea with vomiting, unspecified: Secondary | ICD-10-CM | POA: Diagnosis not present

## 2022-05-26 DIAGNOSIS — J069 Acute upper respiratory infection, unspecified: Secondary | ICD-10-CM | POA: Insufficient documentation

## 2022-05-26 LAB — RESP PANEL BY RT-PCR (FLU A&B, COVID) ARPGX2
Influenza A by PCR: NEGATIVE
Influenza B by PCR: NEGATIVE
SARS Coronavirus 2 by RT PCR: NEGATIVE

## 2022-05-26 MED ORDER — METOCLOPRAMIDE HCL 5 MG/ML IJ SOLN: 10.0000 mg | Freq: Once | INTRAMUSCULAR | Status: DC

## 2022-05-26 MED ORDER — PROMETHAZINE HCL 25 MG PO TABS: 12.5000 mg | ORAL_TABLET | Freq: Four times a day (QID) | ORAL | 0 refills | Status: DC | PRN

## 2022-05-26 MED ORDER — METOCLOPRAMIDE HCL 5 MG/ML IJ SOLN
10.0000 mg | Freq: Once | INTRAMUSCULAR | Status: AC
  Administered 2022-05-26: 10 mg via INTRAMUSCULAR

## 2022-05-26 NOTE — ED Triage Notes (Signed)
Emesis, diarrhea, cough sudden onset at 2am. Pt reports sore throat and fever this am around 6am. Pt reports sore throat has resolved and reports attempted to take tylenol and ibuprofen but reports unable to keep medication or water down.

## 2022-05-26 NOTE — ED Provider Notes (Signed)
[facility]    CSN: [id number] Arrival date & time: 05/26/22  1349      History   Chief Complaint Chief Complaint  Patient presents with   Emesis    HPI Roberta Baker is a 36 y.o. female.   Presenting today with sudden onset nausea, vomiting, chills, sweats, fever, cough, congestion, sore throat since 2 AM this morning.  Denies chest pain, shortness of breath, abdominal pain, rashes.  Has been trying to take Tylenol but unable to keep anything down at this time.  Not tolerating fluids.  No known sick contacts recently other than her 67-month-old had a runny nose.    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/2016   Depression with anxiety 12/12/2016    Past Surgical History:  Procedure Laterality Date   BREAST ENHANCEMENT SURGERY     BREAST SURGERY     LAPAROSCOPIC BILATERAL SALPINGECTOMY Bilateral 01/16/2022   Procedure: LAPAROSCOPIC BILATERAL SALPINGECTOMY;  Surgeon: Myna Hidalgo, DO;  Location: [facility];  Service: Gynecology;  Laterality: Bilateral;   LEEP N/A 01/16/2022   Procedure: LOOP ELECTROSURGICAL EXCISION PROCEDURE (LEEP);  Surgeon: Myna Hidalgo, DO;  Location: [facility];  Service: Gynecology;  Laterality: N/A;    OB History     Gravida  2   Para  2   Term  2   Preterm      AB  0   Living  2      SAB      IAB      Ectopic      Multiple  0   Live Births  2            Home Medications    Prior to Admission medications   Medication Sig  Start Date End Date Taking? Authorizing Provider  diphenhydrAMINE (BENADRYL) 25 MG tablet Take 25 mg by mouth 2 (two) times daily as needed (itchy eyes).   Yes [provider]  tetrahydrozoline-zinc (VISINE-AC) 0.05-0.25 % ophthalmic solution Place 2 drops into both eyes 3 (three) times daily as needed (itchy eyes).   Yes [provider]  ALPRAZolam Prudy Feeler) 0.5 MG tablet Take 1 tablet (0.5 mg total) by mouth daily as needed for anxiety. 04/03/22   Ameduite, Alvino Chapel, NP  amoxicillin (AMOXIL) 500 MG capsule Take 2 capsules (1,000 mg total) by mouth 2 (two) times daily. 03/19/22   Dione Booze, MD  DULoxetine (CYMBALTA) 30 MG capsule Take 2 capsules (60 mg total) by mouth daily. 05/07/22   Ameduite, Alvino Chapel, NP  HYDROcodone-acetaminophen (NORCO) 5-325 MG tablet Take 1 tablet by mouth every 4 (four) hours as needed for moderate pain. 03/19/22   Dione Booze, MD  ibuprofen (ADVIL) 800 MG tablet Take 1 tablet (800 mg total) by mouth 3 (three) times daily. 02/26/22   Mesner, Barbara Cower, MD  promethazine (PHENERGAN) 25 MG tablet Take 0.5-1 tablets (12.5-25 mg total) by  mouth every 6 (six) hours as needed for nausea or vomiting. 05/26/22   Volney American, PA-C    Family History Family History  Problem Relation Age of Onset   Cancer Maternal Grandmother        breast cancer   Heart attack Maternal Grandfather    Mental illness Mother    Seizures Mother    Cancer Maternal Uncle     Social History Social History   Tobacco Use   Smoking status: Every Day    Packs/day: 0.75    Years: 0.50    Total pack years: 0.38    Types: Cigarettes   Smokeless tobacco: Never  Vaping Use   Vaping Use: Never used  Substance Use Topics   Alcohol use: No   Drug use: Not Currently    Types: Marijuana    Comment: occasional     Allergies   Buspar [buspirone] and Sulfa antibiotics   Review of Systems Review of Systems PER HPI  Physical Exam Triage Vital Signs ED Triage Vitals  [05/26/22 1446]  Enc Vitals Group     BP      Pulse      Resp      Temp      Temp src      SpO2      Weight      Height      Head Circumference      Peak Flow      Pain Score 0     Pain Loc      Pain Edu?      Excl. in [place]?    No data found.  Updated Vital Signs LMP 05/06/2022 (Within Months)   Breastfeeding No   Visual Acuity Right Eye Distance:   Left Eye Distance:   Bilateral Distance:    Right Eye Near:   Left Eye Near:    Bilateral Near:     Physical Exam Vitals and nursing note reviewed.  Constitutional:      Appearance: Normal appearance.  HENT:     Head: Atraumatic.     Right Ear: Tympanic membrane and external ear normal.     Left Ear: Tympanic membrane and external ear normal.     Nose: Rhinorrhea present.     Mouth/Throat:     Mouth: Mucous membranes are moist.     Pharynx: Posterior oropharyngeal erythema present.  Eyes:     Extraocular Movements: Extraocular movements intact.     Conjunctiva/sclera: Conjunctivae normal.  Cardiovascular:     Rate and Rhythm: Normal rate and regular rhythm.     Heart sounds: Normal heart sounds.  Pulmonary:     Effort: Pulmonary effort is normal.     Breath sounds: Normal breath sounds. No wheezing or rales.  Abdominal:     General: Bowel sounds are normal. There is no distension.     Palpations: Abdomen is soft.     Tenderness: There is no abdominal tenderness. There is no guarding.  Musculoskeletal:        General: Normal range of motion.     Cervical back: Normal range of motion and neck supple.  Skin:    General: Skin is warm and dry.  Neurological:     Mental Status: She is alert and oriented to person, place, and time.  Psychiatric:        Mood and Affect: Mood normal.        Thought Content: Thought content normal.      UC Treatments /  Results  Labs (all labs ordered are listed, but only abnormal results are displayed) Labs Reviewed  RESP PANEL BY RT-PCR (FLU A&B, COVID) [id number]     EKG   Radiology No results found.  Procedures Procedures (including critical care time)  Medications Ordered in UC Medications  metoCLOPramide (REGLAN) injection 10 mg (10 mg Intramuscular Given 05/26/22 1512)    Initial Impression / Assessment and Plan / UC Course  I have reviewed the triage vital signs and the nursing notes.  Pertinent labs & imaging results that were available during my care of the patient were reviewed by me and considered in my medical decision making (see chart for details).     Suspect new viral upper respiratory infection, respiratory panel pending, Reglan injection given for nausea she does not tolerate Zofran.  Phenergan sent for as needed nausea and vomiting relief, discussed supportive over-the-counter medications.  Return precautions reviewed.  Work note given.  Final Clinical Impressions(s) / UC Diagnoses   Final diagnoses:  Encounter for screening for COVID-19  Viral URI  Nausea and vomiting, unspecified vomiting type   Discharge Instructions   None    ED Prescriptions     Medication Sig Dispense Auth. Provider   promethazine (PHENERGAN) 25 MG tablet Take 0.5-1 tablets (12.5-25 mg total) by mouth every 6 (six) hours as needed for nausea or vomiting. 30 tablet Particia Nearing, [place]      PDMP not reviewed this encounter.   Particia Nearing, [place] 05/26/22 1554

## 2022-05-29 ENCOUNTER — Encounter: Payer: Self-pay | Admitting: Nurse Practitioner

## 2022-05-29 ENCOUNTER — Ambulatory Visit: Payer: Medicaid Other | Admitting: Nurse Practitioner

## 2022-05-29 VITALS — BP 105/62 | HR 115 | Temp 98.2°F | Ht 60.0 in | Wt 126.0 lb

## 2022-05-29 DIAGNOSIS — R051 Acute cough: Secondary | ICD-10-CM

## 2022-05-29 DIAGNOSIS — F418 Other specified anxiety disorders: Secondary | ICD-10-CM

## 2022-05-29 MED ORDER — PROMETHAZINE HCL 25 MG PO TABS: 12.5000 mg | ORAL_TABLET | Freq: Four times a day (QID) | ORAL | 2 refills | Status: DC | PRN

## 2022-05-29 MED ORDER — DULOXETINE HCL 60 MG PO CPEP: 60.0000 mg | ORAL_CAPSULE | Freq: Every day | ORAL | 2 refills | Status: DC

## 2022-05-29 MED ORDER — ALPRAZOLAM 0.5 MG PO TABS: 0.5000 mg | ORAL_TABLET | Freq: Every day | ORAL | 0 refills | Status: DC | PRN

## 2022-05-29 NOTE — Patient Instructions (Addendum)
[facility] [place]) [place]   Phone: [phone] Fax: [phone]  Check out [employer] on [employer]

## 2022-05-29 NOTE — Progress Notes (Unsigned)
Subjective:    Patient ID: Roberta Baker, female    DOB: 1985-10-11, 36 y.o.   MRN: [id number]  HPI Follow up anxiety and depression.  Patient states that she feels like increasing her Cymbalta to 60 mg has greatly helped.  Patient states that she only takes her Xanax when she is having a panic attack which is 2-3 times a week.  However patient states that being on Cymbalta 60 mg has greatly decreased the frequency of her panic attacks.  Patient states that she does exercise as a way to manage her anxiety.  Patient states that she used to dance and loves dancing which also helps with her anxiety.  Due to patient having a small child at home she is unable to get into dancing classes like she used to.  Patient still interested in meeting with psychologist, psychiatrist, or counselor to help manage anxiety.  Patient would like refill of refill xanax and promethazine today.  Patient also concerned about a nonproductive cough x2 days.  Patient denies any fevers, body aches, chills, runny nose, headaches, difficulty breathing, shortness of breath.  Review of Systems  Respiratory:  Positive for cough.   Psychiatric/Behavioral:         Anxiety  All other systems reviewed and are negative.      Objective:   Physical Exam Vitals reviewed.  Constitutional:      General: She is not in acute distress.    Appearance: Normal appearance. She is normal weight. She is not ill-appearing, toxic-appearing or diaphoretic.  HENT:     Head: Normocephalic and atraumatic.  Cardiovascular:     Rate and Rhythm: Normal rate and regular rhythm.     Pulses: Normal pulses.     Heart sounds: Normal heart sounds. No murmur heard. Pulmonary:     Effort: Pulmonary effort is normal. No respiratory distress.     Breath sounds: Normal breath sounds. No wheezing.  Musculoskeletal:     Comments: Grossly intact  Skin:    General: Skin is warm.     Capillary Refill: Capillary refill takes less than 2  seconds.  Neurological:     Mental Status: She is alert.     Comments: Grossly intact  Psychiatric:        Mood and Affect: Mood normal.        Behavior: Behavior normal.           Assessment & Plan:   1. Depression with anxiety -We will refill Xanax and promethazine today. -We will also send in new prescription for Cymbalta today for 60 mg - ALPRAZolam (XANAX) 0.5 MG tablet; Take 1 tablet (0.5 mg total) by mouth daily as needed for anxiety.  Dispense: 30 tablet; Refill: 0 - promethazine (PHENERGAN) 25 MG tablet; Take 0.5-1 tablets (12.5-25 mg total) by mouth every 6 (six) hours as needed for nausea or vomiting.  Dispense: 30 tablet; Refill: 2 - DULoxetine (CYMBALTA) 60 MG capsule; Take 1 capsule (60 mg total) by mouth daily.  Dispense: 60 capsule; Refill: 2 -Provided with mental health resources and encouraged to contact them to set up counseling -Goal is to get patient established with counselor to help with better anxiety coping mechanisms and begin to wean patient off of Xanax.  Patient states she is in agreement with plan. -Patient referred to psychiatrist.  [facility] but states that due to transportation challenges she is unable to get to [place]. -Return to clinic in 4 to 5 weeks for follow-up  2. Acute cough -  Likely allergy related possibly viral. -No clinical exam findings that suggest pneumonia -Encourage patient to continue monitoring symptoms and if symptoms worsen return to clinic to be evaluated    Note:  This document was prepared using Dragon voice recognition software and may include unintentional dictation errors. Note - This record has been created using [employer].  Chart creation errors have been sought, but may not always  have been located. Such creation errors do not reflect on  the standard of medical care.

## 2022-06-06 ENCOUNTER — Encounter: Payer: Self-pay | Admitting: Nurse Practitioner

## 2022-06-07 ENCOUNTER — Other Ambulatory Visit: Payer: Self-pay | Admitting: Nurse Practitioner

## 2022-06-07 MED ORDER — BENZONATATE 100 MG PO CAPS: 100.0000 mg | ORAL_CAPSULE | Freq: Two times a day (BID) | ORAL | 0 refills | Status: DC | PRN

## 2022-06-26 ENCOUNTER — Ambulatory Visit: Payer: Medicaid Other | Admitting: Nurse Practitioner

## 2022-06-26 ENCOUNTER — Encounter: Payer: Self-pay | Admitting: Nurse Practitioner

## 2022-06-26 VITALS — Ht 60.0 in | Wt 123.8 lb

## 2022-06-26 DIAGNOSIS — F418 Other specified anxiety disorders: Secondary | ICD-10-CM | POA: Diagnosis not present

## 2022-06-26 DIAGNOSIS — R051 Acute cough: Secondary | ICD-10-CM

## 2022-06-26 MED ORDER — ALPRAZOLAM 0.5 MG PO TABS: 0.5000 mg | ORAL_TABLET | Freq: Every day | ORAL | 0 refills | Status: DC | PRN

## 2022-06-26 NOTE — Progress Notes (Signed)
Subjective:    Patient ID: Roberta Baker, female    DOB: [birth year], 36 y.o.   MRN: [id number]  HPI  Patient arrives with cough and congestion x4 days. Patient states her daughter just tested positive for COVID about 5 days ago. Patient stated that she had a fever he first day but has not had any other fever since then. Patient also admits to some episodes of nausea and vomiting. Patient denies any SOB, difficulty breathing, or wheezing, chills body aches, or chills.   Patient also states that her anxiety is up due to a recent breakup with her boyfriend who is also the father to her children. Patient states that goes several days without using her Xanax then has to use 2 a day due to very high anxiety. Patient states that she has had multiple anxiety attacks.   Patient does admit that she has been able to get an appointment with a therapist on November 3rd with Nora.  Patient denies any thoughts of wanting to hurt self or anyone else.    Review of Systems  Psychiatric/Behavioral:         Anxiety  All other systems reviewed and are negative.      Objective:   Physical Exam Vitals reviewed.  Constitutional:      General: She is not in acute distress.    Appearance: Normal appearance. She is normal weight. She is not ill-appearing, toxic-appearing or diaphoretic.  HENT:     Head: Normocephalic and atraumatic.  Cardiovascular:     Rate and Rhythm: Normal rate and regular rhythm.     Pulses: Normal pulses.     Heart sounds: Normal heart sounds. No murmur heard. Pulmonary:     Effort: Pulmonary effort is normal. No respiratory distress.     Breath sounds: Normal breath sounds. No wheezing.  Musculoskeletal:     Comments: Grossly intact  Skin:    General: Skin is warm.     Capillary Refill: Capillary refill takes less than 2 seconds.  Neurological:     Mental Status: She is alert.     Comments: Grossly intact  Psychiatric:        Mood and Affect: Mood  normal.        Behavior: Behavior normal.           Assessment & Plan:   1. Acute cough -Likely COVID -Encourage patient to use over-the-counter Robitussin and other cold remedies to help with symptoms -Advised that most viral illnesses last for 5 to 7 days. -Patient provided with note for work -Return to clinic if symptoms do not improve or worsen  2. Depression with anxiety -Patient's anxiety appears to be interfering with ADLs -Discussed in detail that I am not in agreement with increasing Xanax due to addictive potential, however we will supply patient with 45 days of Xanax to cover the time that will take for her to establish with new provider -Discussed that it is my hope that with the start of counseling she will need Xanax less and less. -Xanax is just to be used during very high anxiety and panic attacks and hopefully with counseling panic attacks and high anxiety will become more tolerable as time goes on. - ALPRAZolam (XANAX) 0.5 MG tablet; Take 1 tablet (0.5 mg total) by mouth daily as needed for anxiety.  Dispense: 45 tablet; Refill: 0 -Continue taking Cymbalta as prescribed -Return to clinic in 3 months to establish with new PCP or sooner if needed  Note:  This document was prepared using Dragon voice recognition software and may include unintentional dictation errors. Note - This record has been created using [employer].  Chart creation errors have been sought, but may not always  have been located. Such creation errors do not reflect on  the standard of medical care.

## 2022-07-02 ENCOUNTER — Ambulatory Visit: Payer: Medicaid Other | Admitting: Nurse Practitioner

## 2022-07-02 ENCOUNTER — Encounter: Payer: Self-pay | Admitting: Nurse Practitioner

## 2022-07-02 ENCOUNTER — Other Ambulatory Visit: Payer: Self-pay | Admitting: Nurse Practitioner

## 2022-07-02 DIAGNOSIS — F418 Other specified anxiety disorders: Secondary | ICD-10-CM

## 2022-07-05 NOTE — Telephone Encounter (Signed)
error 

## 2022-08-13 ENCOUNTER — Ambulatory Visit
Admission: EM | Admit: 2022-08-13 | Discharge: 2022-08-13 | Disposition: A | Discharge status: Home / Self Care | Payer: Medicaid Other | Attending: Family Medicine | Admitting: Family Medicine

## 2022-08-13 DIAGNOSIS — Z79899 Other long term (current) drug therapy: Secondary | ICD-10-CM | POA: Insufficient documentation

## 2022-08-13 DIAGNOSIS — R110 Nausea: Secondary | ICD-10-CM | POA: Insufficient documentation

## 2022-08-13 DIAGNOSIS — R059 Cough, unspecified: Secondary | ICD-10-CM | POA: Diagnosis not present

## 2022-08-13 DIAGNOSIS — J069 Acute upper respiratory infection, unspecified: Secondary | ICD-10-CM | POA: Diagnosis present

## 2022-08-13 DIAGNOSIS — Z1152 Encounter for screening for COVID-19: Secondary | ICD-10-CM | POA: Diagnosis not present

## 2022-08-13 DIAGNOSIS — F418 Other specified anxiety disorders: Secondary | ICD-10-CM | POA: Diagnosis present

## 2022-08-13 LAB — RESP PANEL BY RT-PCR (FLU A&B, COVID) ARPGX2
Influenza A by PCR: NEGATIVE
Influenza B by PCR: NEGATIVE
SARS Coronavirus 2 by RT PCR: NEGATIVE

## 2022-08-13 MED ORDER — PROMETHAZINE HCL 25 MG PO TABS: 12.5000 mg | ORAL_TABLET | Freq: Four times a day (QID) | ORAL | 0 refills | Status: DC | PRN

## 2022-08-13 MED ORDER — PROMETHAZINE-DM 6.25-15 MG/5ML PO SYRP: 5.0000 mL | ORAL_SOLUTION | Freq: Four times a day (QID) | ORAL | 0 refills | Status: DC | PRN

## 2022-08-13 NOTE — ED Triage Notes (Signed)
Cough that started Friday. Also would like new prescription for xanax and phenergan.

## 2022-08-13 NOTE — ED Provider Notes (Addendum)
[facility]    CSN: [id number] Arrival date & time: 08/13/22  [id number]      History   Chief Complaint Chief Complaint  Patient presents with   Cough    HPI Roberta Baker is a 36 y.o. female.   Patient presenting today with 2-day history of productive cough.  Denies fever, chills, body aches, chest pain, shortness of breath.  So far tried anything over-the-counter for symptoms.  Children sick with similar symptoms.  She is also requesting a refill on her Phenergan prescription that she takes chronically as needed for chronic nausea and vomiting.    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    Patient Active Problem List   Diagnosis Date Noted   Abnormal Pap smear of cervix 03/10/2021   Smoker 02/28/2021   History of prior pregnancy with IUGR newborn 03/14/2017   Marijuana use 03/13/2017   HSV infection 12/12/2016   Depression with anxiety 12/12/2016    Past Surgical History:  Procedure Laterality Date   BREAST ENHANCEMENT SURGERY     BREAST SURGERY     LAPAROSCOPIC BILATERAL SALPINGECTOMY Bilateral 01/16/2022   Procedure: LAPAROSCOPIC BILATERAL SALPINGECTOMY;  Surgeon: Janyth Pupa, DO;  Location: [facility];  Service: Gynecology;  Laterality: Bilateral;   LEEP N/A 01/16/2022   Procedure: LOOP ELECTROSURGICAL EXCISION PROCEDURE (LEEP);  Surgeon: Janyth Pupa, DO;  Location: [facility];  Service: Gynecology;  Laterality: N/A;    OB History     Gravida  2   Para  2   Term  2   Preterm      AB  0   Living  2      SAB      IAB      Ectopic      Multiple  0   Live Births  2            Home Medications    Prior to Admission medications   Medication Sig Start Date End Date  Taking? Authorizing Provider  ALPRAZolam Duanne Moron) 0.5 MG tablet Take 1 tablet (0.5 mg total) by mouth daily as needed for anxiety. 06/26/22  Yes Ameduite, Trenton Gammon, FNP  diphenhydrAMINE (BENADRYL) 25 MG tablet Take 25 mg by mouth 2 (two) times daily as needed (itchy eyes).   Yes [provider]  DULoxetine (CYMBALTA) 60 MG capsule Take 1 capsule (60 mg total) by mouth daily. 05/29/22  Yes Ameduite, Trenton Gammon, FNP  ibuprofen (ADVIL) 800 MG tablet Take 1 tablet (800 mg total) by mouth 3 (three) times daily. 02/26/22  Yes Mesner, Corene Cornea, MD  promethazine-dextromethorphan (PROMETHAZINE-DM) 6.25-15 MG/5ML syrup Take 5 mLs by mouth 4 (four) times daily as needed. 08/13/22  Yes Volney American, PA-C  tetrahydrozoline-zinc (VISINE-AC) 0.05-0.25 % ophthalmic solution Place 2 drops into both eyes 3 (three) times daily as needed (itchy eyes).   Yes [provider]  benzonatate (TESSALON) 100 MG capsule Take 1 capsule (100 mg total) by mouth 2 (two) times daily as needed for cough. 06/07/22   Ameduite, Trenton Gammon, FNP  promethazine (PHENERGAN) 25 MG tablet Take 0.5-1 tablets (12.5-25 mg total) by mouth every 6 (six)  hours as needed for nausea or vomiting. 08/13/22   Volney American, PA-C    Family History Family History  Problem Relation Age of Onset   Cancer Maternal Grandmother        breast cancer   Heart attack Maternal Grandfather    Mental illness Mother    Seizures Mother    Cancer Maternal Uncle     Social History Social History   Tobacco Use   Smoking status: Every Day    Packs/day: 0.75    Years: 0.50    Total pack years: 0.38    Types: Cigarettes   Smokeless tobacco: Never  Vaping Use   Vaping Use: Never used  Substance Use Topics   Alcohol use: No   Drug use: Not Currently    Types: Marijuana    Comment: occasional     Allergies   Buspar [buspirone] and Sulfa antibiotics   Review of Systems Review of Systems Per HPI  Physical Exam Triage  Vital Signs ED Triage Vitals  Enc Vitals Group     BP 08/13/22 1039 98/67     Pulse Rate 08/13/22 1039 95     Resp 08/13/22 1039 18     Temp 08/13/22 1039 98.1 F (36.7 C)     Temp Source 08/13/22 1039 Oral     SpO2 08/13/22 1048 95 %     Weight --      Height --      Head Circumference --      Peak Flow --      Pain Score 08/13/22 1042 0     Pain Loc --      Pain Edu? --      Excl. in [place]? --    No data found.  Updated Vital Signs BP 98/67 (BP Location: Right Arm)   Pulse 95   Temp 98.1 F (36.7 C) (Oral)   Resp 18   LMP 08/06/2022 (Approximate)   SpO2 95%   Visual Acuity Right Eye Distance:   Left Eye Distance:   Bilateral Distance:    Right Eye Near:   Left Eye Near:    Bilateral Near:     Physical Exam Vitals and nursing note reviewed.  Constitutional:      Appearance: Normal appearance.  HENT:     Head: Atraumatic.     Right Ear: Tympanic membrane and external ear normal.     Left Ear: Tympanic membrane and external ear normal.     Nose: Nose normal.     Mouth/Throat:     Mouth: Mucous membranes are moist.     Pharynx: Posterior oropharyngeal erythema present. No oropharyngeal exudate.  Eyes:     Extraocular Movements: Extraocular movements intact.     Conjunctiva/sclera: Conjunctivae normal.  Cardiovascular:     Rate and Rhythm: Normal rate and regular rhythm.     Heart sounds: Normal heart sounds.  Pulmonary:     Effort: Pulmonary effort is normal.     Breath sounds: Normal breath sounds. No wheezing or rales.  Musculoskeletal:        General: Normal range of motion.     Cervical back: Normal range of motion and neck supple.  Skin:    General: Skin is warm and dry.  Neurological:     Mental Status: She is alert and oriented to person, place, and time.  Psychiatric:        Mood and Affect: Mood normal.        Thought Content: Thought  content normal.      UC Treatments / Results  Labs (all labs ordered are listed, but only abnormal  results are displayed) Labs Reviewed  RESP PANEL BY RT-PCR (FLU A&B, COVID) [id number]    EKG   Radiology No results found.  Procedures Procedures (including critical care time)  Medications Ordered in UC Medications - No data to display  Initial Impression / Assessment and Plan / UC Course  I have reviewed the triage vital signs and the nursing notes.  Pertinent labs & imaging results that were available during my care of the patient were reviewed by me and considered in my medical decision making (see chart for details).     Phenergan refilled, follow-up with PCP for ongoing refills.  Respiratory panel pending for her new cough, Phenergan DM, supportive over-the-counter medications and home care reviewed.  Return for worsening symptoms.  Final Clinical Impressions(s) / UC Diagnoses   Final diagnoses:  Viral URI with cough  Chronic nausea   Discharge Instructions   None    ED Prescriptions     Medication Sig Dispense Auth. Provider   promethazine-dextromethorphan (PROMETHAZINE-DM) 6.25-15 MG/5ML syrup Take 5 mLs by mouth 4 (four) times daily as needed. 100 mL Particia Nearing, PA-C   promethazine (PHENERGAN) 25 MG tablet Take 0.5-1 tablets (12.5-25 mg total) by mouth every 6 (six) hours as needed for nausea or vomiting. 30 tablet Particia Nearing, [place]      PDMP not reviewed this encounter.   Particia Nearing, [place] 08/13/22 1110    [address] [place], [place] 08/13/22 1111

## 2022-08-30 ENCOUNTER — Ambulatory Visit: Payer: Medicaid Other | Admitting: Family Medicine

## 2022-08-30 VITALS — BP 98/60 | Temp 98.6°F | Wt 119.0 lb

## 2022-08-30 DIAGNOSIS — R110 Nausea: Secondary | ICD-10-CM

## 2022-08-30 DIAGNOSIS — F418 Other specified anxiety disorders: Secondary | ICD-10-CM

## 2022-08-30 DIAGNOSIS — E785 Hyperlipidemia, unspecified: Secondary | ICD-10-CM | POA: Insufficient documentation

## 2022-08-30 MED ORDER — ALPRAZOLAM 0.5 MG PO TABS: 0.5000 mg | ORAL_TABLET | Freq: Two times a day (BID) | ORAL | 1 refills | Status: DC | PRN

## 2022-08-30 MED ORDER — DULOXETINE HCL 60 MG PO CPEP: 120.0000 mg | ORAL_CAPSULE | Freq: Every day | ORAL | 1 refills | Status: DC

## 2022-08-30 MED ORDER — PROMETHAZINE HCL 25 MG PO TABS: 12.5000 mg | ORAL_TABLET | Freq: Three times a day (TID) | ORAL | 0 refills | Status: DC | PRN

## 2022-08-30 NOTE — Assessment & Plan Note (Addendum)
Uncontrolled/worsening.   We discussed the pros and cons of alprazolam.  Continuing for now.  I have increased her Cymbalta in hopes that we can decrease the use and hopefully discontinue alprazolam.

## 2022-08-30 NOTE — Patient Instructions (Signed)
We will work on the referral.  Medications as directed.  Follow up in 3-6 months.

## 2022-08-30 NOTE — Progress Notes (Signed)
Subjective:  Patient ID: Roberta Baker, female    DOB: 1986/01/18  Age: 36 y.o. MRN: [id number]  CC: Chief Complaint  Patient presents with   Anxiety    Follow up, patient spoke with psych and was advised she needed an therapist instead    HPI:  36 year old female presents for follow-up regarding depression and anxiety.  Patient's depression and anxiety is uncontrolled.  PHQ-9 score of 19.  GAD-7 score of 21.  She recently lost her job which is contributing to her worsening depression and anxiety.  She is currently on Cymbalta.  She is also on alprazolam.  She is in need of refills.  Patient also reports that she has chronic nausea.  She has previously smoked marijuana on a regular basis.  She is not smoking marijuana currently.  She is trying to cut back on her cigarette smoking.  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   Depression with anxiety 12/12/2016    Social Hx   Social History   Socioeconomic History   Marital status: Single    Spouse name: Not on file   Number of children: 1   Years of education: Not on file   Highest education level: Not on file  Occupational History   Not on file  Tobacco Use   Smoking status: Every Day    Packs/day: 0.75    Years: 0.50    Total pack years: 0.38    Types: Cigarettes   Smokeless tobacco: Never  Vaping Use   Vaping Use: Never used  Substance and Sexual Activity   Alcohol use: No   Drug use: Not Currently    Types: Marijuana    Comment: occasional   Sexual activity: Not Currently    Birth control/protection: Surgical    Comment: tubal  Other Topics Concern   Not on file  Social History Narrative   Not on file   Social Determinants of Health   Financial Resource Strain: Unknown (06/02/2021)    Overall Financial Resource Strain (CARDIA)    Difficulty of Paying Living Expenses: Patient refused  Food Insecurity: Food Insecurity Present (06/02/2021)   Hunger Vital Sign    Worried About Running Out of Food in the Last Year: Often true    Ran Out of Food in the Last Year: Sometimes true  Transportation Needs: No Transportation Needs (06/02/2021)   PRAPARE - Administrator, Civil Service (Medical): No    Lack of Transportation (Non-Medical): No  Physical Activity: Sufficiently Active (06/02/2021)   Exercise Vital Sign    Days of Exercise per Week: 7 days    Minutes of Exercise per Session: 100 min  Stress: Stress Concern Present (06/02/2021)   [employer] of Occupational Health - Occupational Stress Questionnaire    Feeling of Stress : Very much  Social Connections: Moderately Isolated (06/02/2021)   Social Connection and Isolation Panel [NHANES]    Frequency of Communication with Friends and Family: More than three times a week    Frequency of Social Gatherings with Friends and Family: More than three times a week    Attends Religious Services: Never    Database administrator or Organizations: No    Attends Banker Meetings: Never    Marital Status: Living with  partner    Review of Systems Per HPI  Objective:  BP 98/60   Temp 98.6 F (37 C)   Wt 119 lb (54 kg)   LMP 08/06/2022 (Approximate)   BMI 23.24 kg/m      08/30/2022    8:24 AM 08/13/2022   10:39 AM 06/26/2022    8:21 AM  BP/Weight  Systolic BP 98 98   Diastolic BP 60 67   Wt. (Lbs) 119  123.8  BMI 23.24 kg/m2  24.18 kg/m2    Physical Exam Vitals and nursing note reviewed.  Constitutional:      General: She is not in acute distress.    Appearance: Normal appearance.  HENT:     Head: Normocephalic and atraumatic.  Cardiovascular:     Rate and Rhythm: Normal rate and regular rhythm.  Pulmonary:     Effort: Pulmonary effort is normal.     Breath sounds: Normal breath  sounds. No wheezing, rhonchi or rales.  Neurological:     Mental Status: She is alert.  Psychiatric:        Mood and Affect: Mood normal.        Behavior: Behavior normal.     Lab Results  Component Value Date   WBC 7.7 01/12/2022   HGB 11.4 (L) 01/12/2022   HCT 35.0 (L) 01/12/2022   PLT 404 (H) 01/12/2022   GLUCOSE 92 11/08/2021   CHOL 214 (H) 11/08/2021   TRIG 138 11/08/2021   HDL 36 (L) 11/08/2021   LDLCALC 153 (H) 11/08/2021   ALT 13 11/08/2021   AST 15 11/08/2021   NA 143 11/08/2021   K 3.8 11/08/2021   CL 107 (H) 11/08/2021   CREATININE 0.82 11/08/2021   BUN 6 11/08/2021   CO2 23 11/08/2021   TSH 0.771 11/08/2021   INR 0.98 10/25/2018     Assessment & Plan:   Problem List Items Addressed This Visit       Other   Chronic nausea    Phenergan as needed      Depression with anxiety - Primary    Uncontrolled/worsening.   We discussed the pros and cons of alprazolam.  Continuing for now.  I have increased her Cymbalta in hopes that we can decrease the use and hopefully discontinue alprazolam.      Relevant Medications   ALPRAZolam (XANAX) 0.5 MG tablet   promethazine (PHENERGAN) 25 MG tablet   DULoxetine (CYMBALTA) 60 MG capsule   Other Relevant Orders   Ambulatory referral to Psychology    Meds ordered this encounter  Medications   ALPRAZolam (XANAX) 0.5 MG tablet    Sig: Take 1 tablet (0.5 mg total) by mouth 2 (two) times daily as needed for anxiety.    Dispense:  60 tablet    Refill:  1   promethazine (PHENERGAN) 25 MG tablet    Sig: Take 0.5-1 tablets (12.5-25 mg total) by mouth every 8 (eight) hours as needed for nausea or vomiting.    Dispense:  30 tablet    Refill:  0   DULoxetine (CYMBALTA) 60 MG capsule    Sig: Take 2 capsules (120 mg total) by mouth daily.    Dispense:  180 capsule    Refill:  1    Follow-up: 3 to 6 months  Christain Mcraney Adriana Simas DO [name]

## 2022-08-30 NOTE — Assessment & Plan Note (Addendum)
Phenergan as needed

## 2022-09-18 ENCOUNTER — Ambulatory Visit
Admission: EM | Admit: 2022-09-18 | Discharge: 2022-09-18 | Disposition: A | Discharge status: Home / Self Care | Payer: Medicaid Other | Attending: Nurse Practitioner | Admitting: Nurse Practitioner

## 2022-09-18 DIAGNOSIS — R110 Nausea: Secondary | ICD-10-CM

## 2022-09-18 DIAGNOSIS — R112 Nausea with vomiting, unspecified: Secondary | ICD-10-CM | POA: Diagnosis not present

## 2022-09-18 DIAGNOSIS — F418 Other specified anxiety disorders: Secondary | ICD-10-CM

## 2022-09-18 MED ORDER — PROMETHAZINE HCL 25 MG PO TABS: 12.5000 mg | ORAL_TABLET | Freq: Three times a day (TID) | ORAL | 0 refills | Status: DC | PRN

## 2022-09-18 NOTE — Discharge Instructions (Addendum)
Resume the phenergan since this works well for you.  I wonder if your body is adjusting to the increased dose of Cymbalta.  Keep taking the Cymbalta as prescribed.  Follow up with your PCP if the vomiting does not improve over the  next 1-2 weeks.

## 2022-09-18 NOTE — ED Provider Notes (Signed)
[facility]    CSN: [id number] Arrival date & time: 09/18/22  0930      History   Chief Complaint Chief Complaint  Patient presents with   Emesis    HPI Roberta Baker is a 37 y.o. female.   Patient presents today for nausea and vomiting.  Reports 1 year history of nausea that is well-controlled with Phenergan typically.  Reports she has been told by numerous healthcare providers that her nausea is anxiety related.  At her last appointment with her primary care provider, her Cymbalta was increased to 120 mg daily due to uncontrolled anxiety.  This was a couple of weeks ago.  She has been taking the Cymbalta as prescribed.  Reports for the past 4 days, she has had vomiting whenever she tries to eat anything.  She has been out of Phenergan for the past couple of weeks.  Also has been trying to quit smoking and does not know if decreasing the nicotine has caused her to have the vomiting.  She has been unable to keep any solid food down, however is able to keep down water and Coca-Cola.  She denies abdominal pain, diarrhea, or blood in the vomit.  No fever, body aches, or chills.  No upper respiratory infection symptoms.  No contacts at home sick with similar symptoms.    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   Depression with anxiety 12/12/2016    Past Surgical History:  Procedure Laterality Date   BREAST ENHANCEMENT SURGERY     BREAST SURGERY      LAPAROSCOPIC BILATERAL SALPINGECTOMY Bilateral 01/16/2022   Procedure: LAPAROSCOPIC BILATERAL SALPINGECTOMY;  Surgeon: Janyth Pupa, DO;  Location: [facility];  Service: Gynecology;  Laterality: Bilateral;   LEEP N/A 01/16/2022   Procedure: LOOP ELECTROSURGICAL EXCISION PROCEDURE (LEEP);  Surgeon: Janyth Pupa, DO;  Location: [facility];  Service: Gynecology;  Laterality: N/A;    OB History     Gravida  2   Para  2   Term  2   Preterm      AB  0   Living  2      SAB      IAB      Ectopic      Multiple  0   Live Births  2            Home Medications    Prior to Admission medications   Medication Sig Start Date End Date Taking? Authorizing Provider  ALPRAZolam Duanne Moron) 0.5 MG tablet Take 1 tablet (0.5 mg total) by mouth 2 (two) times daily as needed for anxiety. 08/30/22   Coral Spikes, DO  DULoxetine (CYMBALTA) 60 MG capsule Take 2 capsules (120 mg total) by mouth daily. 08/30/22 11/28/22  Coral Spikes, DO  promethazine (PHENERGAN) 25 MG tablet Take 0.5-1 tablets (12.5-25 mg total) by mouth  every 8 (eight) hours as needed for nausea or vomiting. 09/18/22   Valentino Nose, NP  tetrahydrozoline-zinc (VISINE-AC) 0.05-0.25 % ophthalmic solution Place 2 drops into both eyes 3 (three) times daily as needed (itchy eyes).    [provider]    Family History Family History  Problem Relation Age of Onset   Cancer Maternal Grandmother        breast cancer   Heart attack Maternal Grandfather    Mental illness Mother    Seizures Mother    Cancer Maternal Uncle     Social History Social History   Tobacco Use   Smoking status: Every Day    Packs/day: 0.75    Years: 0.50    Total pack years: 0.38    Types: Cigarettes   Smokeless tobacco: Never  Vaping Use   Vaping Use: Never used  Substance Use Topics   Alcohol use: No   Drug use: Not Currently    Types: Marijuana    Comment: occasional     Allergies   Buspar [buspirone] and Sulfa  antibiotics   Review of Systems Review of Systems Per HPI  Physical Exam Triage Vital Signs ED Triage Vitals  Enc Vitals Group     BP 09/18/22 1014 118/77     Pulse Rate 09/18/22 1014 (!) 102     Resp 09/18/22 1014 18     Temp 09/18/22 1014 98.1 F (36.7 C)     Temp Source 09/18/22 1014 Oral     SpO2 09/18/22 1014 97 %     Weight --      Height --      Head Circumference --      Peak Flow --      Pain Score 09/18/22 1017 0     Pain Loc --      Pain Edu? --      Excl. in [place]? --    No data found.  Updated Vital Signs BP 118/77 (BP Location: Right Arm)   Pulse (!) 102   Temp 98.1 F (36.7 C) (Oral)   Resp 18   LMP 09/02/2022 (Exact Date)   SpO2 97%   Breastfeeding No   Visual Acuity Right Eye Distance:   Left Eye Distance:   Bilateral Distance:    Right Eye Near:   Left Eye Near:    Bilateral Near:     Physical Exam Vitals and nursing note reviewed.  Constitutional:      General: She is not in acute distress.    Appearance: Normal appearance. She is not ill-appearing or toxic-appearing.  HENT:     Head: Normocephalic and atraumatic.     Mouth/Throat:     Mouth: Mucous membranes are moist.     Pharynx: Oropharynx is clear.  Eyes:     General: No scleral icterus.    Extraocular Movements: Extraocular movements intact.  Cardiovascular:     Rate and Rhythm: Regular rhythm. Tachycardia present.  Pulmonary:     Effort: Pulmonary effort is normal. No respiratory distress.     Breath sounds: Normal breath sounds. No wheezing, rhonchi or rales.  Abdominal:     General: Abdomen is flat. Bowel sounds are normal. There is no distension.     Palpations: Abdomen is soft.     Tenderness: There is no abdominal tenderness. There is no right CVA tenderness, left CVA tenderness, guarding or rebound.  Musculoskeletal:     Cervical back: Normal range of motion.  Lymphadenopathy:  Cervical: No cervical adenopathy.  Skin:    General: Skin is warm and dry.      Capillary Refill: Capillary refill takes less than 2 seconds.     Coloration: Skin is not jaundiced or pale.     Findings: No erythema.  Neurological:     Mental Status: She is alert and oriented to person, place, and time.  Psychiatric:        Mood and Affect: Mood is anxious.        Behavior: Behavior is cooperative.      UC Treatments / Results  Labs (all labs ordered are listed, but only abnormal results are displayed) Labs Reviewed - No data to display  EKG   Radiology No results found.  Procedures Procedures (including critical care time)  Medications Ordered in UC Medications - No data to display  Initial Impression / Assessment and Plan / UC Course  I have reviewed the triage vital signs and the nursing notes.  Pertinent labs & imaging results that were available during my care of the patient were reviewed by me and considered in my medical decision making (see chart for details).   Patient is well-appearing, afebrile, not tachypneic, oxygenating well on room air.  She is mildly tachycardic in triage, however normotensive so I am not concerned about dehydration.  She does appear mildly anxious.  1. Chronic nausea 2. Nausea and vomiting, unspecified vomiting type Refill given for Phenergan which does typically help control nausea and vomiting Suspect new vomiting may be secondary to increase in Cymbalta Discussed with patient that this may last a couple more weeks, however if it does not improve with the Phenergan or persists despite the Phenergan, she needs to follow-up with her primary care provider or in the emergency room Strict ER precautions discussed with patient   The patient was given the opportunity to ask questions.  All questions answered to their satisfaction.  The patient is in agreement to this plan.    Final Clinical Impressions(s) / UC Diagnoses   Final diagnoses:  Chronic nausea  Nausea and vomiting, unspecified vomiting type     Discharge  Instructions      Resume the phenergan since this works well for you.  I wonder if your body is adjusting to the increased dose of Cymbalta.  Keep taking the Cymbalta as prescribed.  Follow up with your PCP if the vomiting does not improve over the  next 1-2 weeks.     ED Prescriptions     Medication Sig Dispense Auth. Provider   promethazine (PHENERGAN) 25 MG tablet Take 0.5-1 tablets (12.5-25 mg total) by mouth every 8 (eight) hours as needed for nausea or vomiting. 30 tablet Eulogio Bear, NP      PDMP not reviewed this encounter.   Eulogio Bear, NP 09/18/22 1118

## 2022-09-18 NOTE — ED Triage Notes (Signed)
Pt reports she's being vomiting x 4 days; nausea x 1 year after she had her baby. Pt reports her Cymbalta dose increase almost twice in the past 2 weeks.

## 2022-10-08 ENCOUNTER — Other Ambulatory Visit: Payer: Self-pay | Admitting: *Deleted

## 2022-10-08 DIAGNOSIS — F418 Other specified anxiety disorders: Secondary | ICD-10-CM

## 2022-10-13 ENCOUNTER — Ambulatory Visit
Admission: EM | Admit: 2022-10-13 | Discharge: 2022-10-13 | Disposition: A | Discharge status: Home / Self Care | Payer: Medicaid Other | Attending: Family Medicine | Admitting: Family Medicine

## 2022-10-13 DIAGNOSIS — Z1152 Encounter for screening for COVID-19: Secondary | ICD-10-CM | POA: Insufficient documentation

## 2022-10-13 DIAGNOSIS — R059 Cough, unspecified: Secondary | ICD-10-CM | POA: Diagnosis not present

## 2022-10-13 DIAGNOSIS — R110 Nausea: Secondary | ICD-10-CM | POA: Diagnosis present

## 2022-10-13 DIAGNOSIS — F418 Other specified anxiety disorders: Secondary | ICD-10-CM | POA: Insufficient documentation

## 2022-10-13 DIAGNOSIS — J069 Acute upper respiratory infection, unspecified: Secondary | ICD-10-CM | POA: Diagnosis present

## 2022-10-13 MED ORDER — PROMETHAZINE HCL 25 MG PO TABS: 12.5000 mg | ORAL_TABLET | Freq: Three times a day (TID) | ORAL | 0 refills | Status: DC | PRN

## 2022-10-13 MED ORDER — PSEUDOEPH-BROMPHEN-DM 30-2-10 MG/5ML PO SYRP: 5.0000 mL | ORAL_SOLUTION | Freq: Four times a day (QID) | ORAL | 0 refills | Status: DC | PRN

## 2022-10-13 MED ORDER — FLUTICASONE PROPIONATE 50 MCG/ACT NA SUSP: 1.0000 | Freq: Two times a day (BID) | NASAL | 2 refills | Status: DC

## 2022-10-13 NOTE — ED Provider Notes (Signed)
[facility]    CSN: [id number] Arrival date & time: 10/13/22  [id number]      History   Chief Complaint No chief complaint on file.   HPI Roberta Baker is a 37 y.o. female.   Presenting today with 3-day history of hacking cough, nasal congestion, decreased appetite, body aches, chills, sweats.  Denies chest pain, shortness of breath, abdominal pain, nausea vomiting or diarrhea.  Taking Dimetapp and Robitussin with minimal relief.  Multiple sick contacts recently.    Past Medical History:  Diagnosis Date   Anxiety    Depression    Headache    HPV (human papilloma virus) infection    Infection    UTI   Kidney infection    Kidney stones    Nausea and vomiting in pregnancy 02/07/2017   Pain, dental 02/07/2017   Seizures ([place])    X1 from abrupt klonopin w/d    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   Depression with anxiety 12/12/2016    Past Surgical History:  Procedure Laterality Date   BREAST ENHANCEMENT SURGERY     BREAST SURGERY     LAPAROSCOPIC BILATERAL SALPINGECTOMY Bilateral 01/16/2022   Procedure: LAPAROSCOPIC BILATERAL SALPINGECTOMY;  Surgeon: Janyth Pupa, DO;  Location: [facility];  Service: Gynecology;  Laterality: Bilateral;   LEEP N/A 01/16/2022   Procedure: LOOP ELECTROSURGICAL EXCISION PROCEDURE (LEEP);  Surgeon: Janyth Pupa, DO;  Location: [facility];  Service: Gynecology;  Laterality: N/A;    OB History     Gravida  2   Para  2   Term  2   Preterm      AB  0   Living  2      SAB      IAB      Ectopic      Multiple  0   Live Births  2            Home Medications    Prior to Admission medications   Medication Sig Start Date End Date Taking? Authorizing Provider   brompheniramine-pseudoephedrine-DM 30-2-10 MG/5ML syrup Take 5 mLs by mouth 4 (four) times daily as needed. 10/13/22  Yes Volney American, PA-C  fluticasone [facility]) 50 MCG/ACT nasal spray Place 1 spray into both nostrils 2 (two) times daily. 10/13/22  Yes Volney American, PA-C  ALPRAZolam Duanne Moron) 0.5 MG tablet Take 1 tablet (0.5 mg total) by mouth 2 (two) times daily as needed for anxiety. 08/30/22   Coral Spikes, DO  DULoxetine (CYMBALTA) 60 MG capsule Take 2 capsules (120 mg total) by mouth daily. 08/30/22 11/28/22  Coral Spikes, DO  promethazine (PHENERGAN) 25 MG tablet Take 0.5-1 tablets (12.5-25 mg total) by mouth every 8 (eight) hours as needed for nausea or vomiting. 10/13/22   Volney American, PA-C  tetrahydrozoline-zinc (VISINE-AC) 0.05-0.25 % ophthalmic solution Place 2 drops into both eyes 3 (three) times daily as needed (itchy eyes).    [provider]    Family History Family History  Problem Relation Age of Onset   Cancer Maternal Grandmother        breast cancer   Heart attack Maternal Grandfather    Mental illness Mother    Seizures Mother    Cancer Maternal Uncle  Social History Social History   Tobacco Use   Smoking status: Every Day    Packs/day: 0.75    Years: 0.50    Total pack years: 0.38    Types: Cigarettes   Smokeless tobacco: Never  Vaping Use   Vaping Use: Never used  Substance Use Topics   Alcohol use: No   Drug use: Not Currently    Types: Marijuana    Comment: occasional     Allergies   Buspar [buspirone] and Sulfa antibiotics   Review of Systems Review of Systems Per HPI  Physical Exam Triage Vital Signs ED Triage Vitals  Enc Vitals Group     BP 10/13/22 0905 120/84     Pulse Rate 10/13/22 0905 88     Resp 10/13/22 0905 18     Temp 10/13/22 0905 99 F (37.2 C)     Temp Source 10/13/22 0905 Oral     SpO2 10/13/22 0905 96 %     Weight --      Height --      Head Circumference --      Peak Flow  --      Pain Score 10/13/22 0909 0     Pain Loc --      Pain Edu? --      Excl. in [place]? --    No data found.  Updated Vital Signs BP 120/84 (BP Location: Right Arm)   Pulse 88   Temp 99 F (37.2 C) (Oral)   Resp 18   LMP 09/30/2022 (Exact Date)   SpO2 96%   Visual Acuity Right Eye Distance:   Left Eye Distance:   Bilateral Distance:    Right Eye Near:   Left Eye Near:    Bilateral Near:     Physical Exam Vitals and nursing note reviewed.  Constitutional:      Appearance: Normal appearance.  HENT:     Head: Atraumatic.     Right Ear: Tympanic membrane and external ear normal.     Left Ear: Tympanic membrane and external ear normal.     Nose: Rhinorrhea present.     Mouth/Throat:     Mouth: Mucous membranes are moist.     Pharynx: Posterior oropharyngeal erythema present.  Eyes:     Extraocular Movements: Extraocular movements intact.     Conjunctiva/sclera: Conjunctivae normal.  Cardiovascular:     Rate and Rhythm: Normal rate and regular rhythm.     Heart sounds: Normal heart sounds.  Pulmonary:     Effort: Pulmonary effort is normal.     Breath sounds: Normal breath sounds. No wheezing or rales.  Musculoskeletal:        General: Normal range of motion.     Cervical back: Normal range of motion and neck supple.  Skin:    General: Skin is warm and dry.  Neurological:     Mental Status: She is alert and oriented to person, place, and time.  Psychiatric:        Mood and Affect: Mood normal.        Thought Content: Thought content normal.      UC Treatments / Results  Labs (all labs ordered are listed, but only abnormal results are displayed) Labs Reviewed  SARS CORONAVIRUS 2 (TAT 6-24 HRS)    EKG   Radiology No results found.  Procedures Procedures (including critical care time)  Medications Ordered in UC Medications - No data to display  Initial Impression / Assessment and Plan / UC  Course  I have reviewed the triage vital signs and the  nursing notes.  Pertinent labs & imaging results that were available during my care of the patient were reviewed by me and considered in my medical decision making (see chart for details).     Vitals and exam overall reassuring and suspicious for viral respiratory infection, COVID testing pending, treat with Bromfed, Flonase, supportive over-the-counter medications and home care.  She is also requesting a refill on her Phenergan for chronic nausea.  Return for worsening symptoms.  Final Clinical Impressions(s) / UC Diagnoses   Final diagnoses:  Viral URI with cough  Chronic nausea   Discharge Instructions   None    ED Prescriptions     Medication Sig Dispense Auth. Provider   promethazine (PHENERGAN) 25 MG tablet Take 0.5-1 tablets (12.5-25 mg total) by mouth every 8 (eight) hours as needed for nausea or vomiting. 30 tablet Volney American, [place]   brompheniramine-pseudoephedrine-DM 30-2-10 MG/5ML syrup Take 5 mLs by mouth 4 (four) times daily as needed. 120 mL Volney American, PA-C   fluticasone [facility]) 50 MCG/ACT nasal spray Place 1 spray into both nostrils 2 (two) times daily. 16 g Volney American, [place]      PDMP not reviewed this encounter.   Volney American, [place] 10/13/22 1011

## 2022-10-13 NOTE — ED Triage Notes (Signed)
Pt reports she thinks she has covid. Has coughing, extreme congestion, loss of appetite, body sweats x 3 days ago.

## 2022-10-14 LAB — SARS CORONAVIRUS 2 (TAT 6-24 HRS): SARS Coronavirus 2: NEGATIVE

## 2022-10-22 ENCOUNTER — Encounter: Payer: Self-pay | Admitting: Family Medicine

## 2022-10-22 ENCOUNTER — Other Ambulatory Visit: Payer: Self-pay | Admitting: Family Medicine

## 2022-10-22 DIAGNOSIS — F418 Other specified anxiety disorders: Secondary | ICD-10-CM

## 2022-10-22 MED ORDER — ALPRAZOLAM 0.5 MG PO TABS: 0.5000 mg | ORAL_TABLET | Freq: Two times a day (BID) | ORAL | 1 refills | Status: DC | PRN

## 2022-10-22 MED ORDER — DULOXETINE HCL 60 MG PO CPEP: 120.0000 mg | ORAL_CAPSULE | Freq: Every day | ORAL | 3 refills | Status: DC

## 2022-10-26 ENCOUNTER — Encounter: Payer: Self-pay | Admitting: Family Medicine

## 2022-10-26 DIAGNOSIS — F418 Other specified anxiety disorders: Secondary | ICD-10-CM

## 2022-10-26 MED ORDER — PROMETHAZINE HCL 25 MG PO TABS: 12.5000 mg | ORAL_TABLET | Freq: Three times a day (TID) | ORAL | 0 refills | Status: DC | PRN

## 2022-11-12 ENCOUNTER — Ambulatory Visit
Admission: EM | Admit: 2022-11-12 | Discharge: 2022-11-12 | Disposition: A | Discharge status: Home / Self Care | Payer: Medicaid Other | Attending: Family Medicine | Admitting: Family Medicine

## 2022-11-12 DIAGNOSIS — H9201 Otalgia, right ear: Secondary | ICD-10-CM | POA: Diagnosis not present

## 2022-11-12 DIAGNOSIS — F418 Other specified anxiety disorders: Secondary | ICD-10-CM

## 2022-11-12 MED ORDER — PROMETHAZINE HCL 25 MG PO TABS: 12.5000 mg | ORAL_TABLET | Freq: Three times a day (TID) | ORAL | 0 refills | Status: DC | PRN

## 2022-11-12 MED ORDER — PREDNISONE 20 MG PO TABS: 40.0000 mg | ORAL_TABLET | Freq: Every day | ORAL | 0 refills | Status: DC

## 2022-11-12 NOTE — ED Triage Notes (Signed)
Pt states she is having ear pain/pressure in right ear, with vomiting, chills, body aches, fever 101 this am and headache. That started Saturday. Taking tylenol.

## 2022-11-12 NOTE — ED Provider Notes (Signed)
[place]   [id number] 11/12/22 Arrival Time: [id number]  ASSESSMENT & PLAN:  1. Acute otalgia, right   2. Depression with anxiety    No signs of ear infection; ques ETD. Discussed. Likely early viral illness. Trial of: Meds ordered this encounter  Medications   predniSONE (DELTASONE) 20 MG tablet    Sig: Take 2 tablets (40 mg total) by mouth daily.    Dispense:  10 tablet    Refill:  0   promethazine (PHENERGAN) 25 MG tablet    Sig: Take 0.5-1 tablets (12.5-25 mg total) by mouth every 8 (eight) hours as needed for nausea or vomiting.    Dispense:  30 tablet    Refill:  0  Questions nausea associated with prednisone.    Follow-up Information     Coral Spikes, DO.   Specialty: Family Medicine Why: If worsening or failing to improve as anticipated. Contact information: [place] [address] [phone]                  Reviewed expectations re: course of current medical issues. Questions answered. Outlined signs and symptoms indicating need for more acute intervention. Patient verbalized understanding. After Visit Summary given.   SUBJECTIVE: History from: patient.  Roberta Baker is a 37 y.o. female who presents with complaint of ear pain/pressure on the right. T 101 F this am. Assoc chills and body aches. Tylenol without much relief. No hearing loss.   Social History   Tobacco Use  Smoking Status Every Day   Packs/day: 0.75   Years: 0.50   Total pack years: 0.38   Types: Cigarettes  Smokeless Tobacco Never      OBJECTIVE:  Vitals:   11/12/22 1113  BP: 120/75  Pulse: 83  Resp: 16  Temp: 98.7 F (37.1 C)  TempSrc: Oral  SpO2: 96%    General appearance: alert; appears fatigued Ear Canal: normal TM: serous otitis on the R Neck: supple without LAD Lungs: unlabored respirations, symmetrical air entry; cough: mild; no respiratory distress Skin: warm and dry Psychological: alert and cooperative; normal  mood and affect  Allergies  Allergen Reactions   Buspar [Buspirone] Other (See Comments)    Caused nightmares/hallucinations   Sulfa Antibiotics Nausea And Vomiting    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   Family History  Problem Relation Age of Onset   Cancer Maternal Grandmother        breast cancer   Heart attack Maternal Grandfather    Mental illness Mother    Seizures Mother    Cancer Maternal Uncle    Social History   Socioeconomic History   Marital status: Single    Spouse name: Not on file   Number of children: 1   Years of education: Not on file   Highest education level: Not on file  Occupational History   Not on file  Tobacco Use   Smoking status: Every Day    Packs/day: 0.75    Years: 0.50    Total pack years: 0.38    Types: Cigarettes   Smokeless tobacco: Never  Vaping Use  Vaping Use: Never used  Substance and Sexual Activity   Alcohol use: No   Drug use: Not Currently    Types: Marijuana    Comment: occasional   Sexual activity: Not Currently    Birth control/protection: Surgical    Comment: tubal  Other Topics Concern   Not on file  Social History Narrative   Not on file   Social Determinants of Health   Financial Resource Strain: Unknown (06/02/2021)   Overall Financial Resource Strain (CARDIA)    Difficulty of Paying Living Expenses: Patient refused  Food Insecurity: Food Insecurity Present (06/02/2021)   Hunger Vital Sign    Worried About Running Out of Food in the Last Year: Often true    Ran Out of Food in the Last Year: Sometimes true  Transportation Needs: No Transportation Needs (06/02/2021)   PRAPARE - Hydrologist (Medical): No    Lack of  Transportation (Non-Medical): No  Physical Activity: Sufficiently Active (06/02/2021)   Exercise Vital Sign    Days of Exercise per Week: 7 days    Minutes of Exercise per Session: 100 min  Stress: Stress Concern Present (06/02/2021)   [place]    Feeling of Stress : Very much  Social Connections: Moderately Isolated (06/02/2021)   Social Connection and Isolation Panel [NHANES]    Frequency of Communication with Friends and Family: More than three times a week    Frequency of Social Gatherings with Friends and Family: More than three times a week    Attends Religious Services: Never    Marine scientist or Organizations: No    Attends Archivist Meetings: Never    Marital Status: Living with partner  Intimate Partner Violence: Not At Risk (06/02/2021)   Humiliation, Afraid, Rape, and Kick questionnaire    Fear of Current or Ex-Partner: No    Emotionally Abused: No    Physically Abused: No    Sexually Abused: No             Vanessa Kick, MD 11/12/22 1343

## 2022-11-22 ENCOUNTER — Encounter: Payer: Self-pay | Admitting: Family Medicine

## 2022-11-22 ENCOUNTER — Other Ambulatory Visit: Payer: Self-pay | Admitting: Family Medicine

## 2022-11-22 DIAGNOSIS — F418 Other specified anxiety disorders: Secondary | ICD-10-CM

## 2022-11-22 MED ORDER — PROMETHAZINE HCL 25 MG PO TABS: 12.5000 mg | ORAL_TABLET | Freq: Three times a day (TID) | ORAL | 0 refills | Status: DC | PRN

## 2022-11-22 MED ORDER — DULOXETINE HCL 30 MG PO CPEP: 90.0000 mg | ORAL_CAPSULE | Freq: Every day | ORAL | 1 refills | Status: DC

## 2022-11-30 ENCOUNTER — Ambulatory Visit: Payer: Medicaid Other | Admitting: Family Medicine

## 2022-11-30 VITALS — BP 113/76 | HR 115 | Ht 60.0 in | Wt 119.6 lb

## 2022-11-30 DIAGNOSIS — F418 Other specified anxiety disorders: Secondary | ICD-10-CM

## 2022-11-30 MED ORDER — ARIPIPRAZOLE 2 MG PO TABS: 2.0000 mg | ORAL_TABLET | Freq: Every day | ORAL | 1 refills | Status: DC

## 2022-11-30 NOTE — Progress Notes (Signed)
Subjective:  Patient ID: Roberta Baker, female    DOB: 1986-06-07  Age: 37 y.o. MRN: [id number]  CC: Chief Complaint  Patient presents with   Anxiety    Patient is having issues with her appetite due to the anxiety.     HPI:  37 year old female presents for follow-up regarding anxiety.  Patient with worsening nausea with increased dose of Cymbalta.  Therefore, Cymbalta was decreased back down to 90 mg daily.  Patient continues using alprazolam as well.  Patient's anxiety continues to be uncontrolled.  Patient's depression is uncontrolled as well.  She has tried Klonopin, BuSpar, and Atarax in the past without improvement.  She has had adverse side effects with these medications.  Patient has no other complaints or concerns at this time.  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   Depression with anxiety 12/12/2016    Social Hx   Social History   Socioeconomic History   Marital status: Single    Spouse name: Not on file   Number of children: 1   Years of education: Not on file   Highest education level: Not on file  Occupational History   Not on file  Tobacco Use   Smoking status: Every Day    Packs/day: 0.75    Years: 0.50    Additional pack years: 0.00    Total pack years: 0.38    Types: Cigarettes   Smokeless tobacco: Never  Vaping Use   Vaping Use: Never used  Substance and Sexual Activity   Alcohol use: No   Drug use: Not Currently    Types: Marijuana    Comment: occasional   Sexual activity: Not Currently    Birth control/protection: Surgical    Comment: tubal  Other Topics Concern   Not on file  Social History Narrative   Not on file   Social Determinants of Health   Financial Resource Strain: Unknown (06/02/2021)   Overall  Financial Resource Strain (CARDIA)    Difficulty of Paying Living Expenses: Patient declined  Food Insecurity: Food Insecurity Present (06/02/2021)   Hunger Vital Sign    Worried About Running Out of Food in the Last Year: Often true    Ran Out of Food in the Last Year: Sometimes true  Transportation Needs: No Transportation Needs (06/02/2021)   PRAPARE - Hydrologist (Medical): No    Lack of Transportation (Non-Medical): No  Physical Activity: Sufficiently Active (06/02/2021)   Exercise Vital Sign    Days of Exercise per Week: 7 days    Minutes of Exercise per Session: 100 min  Stress: Stress Concern Present (06/02/2021)   [place]    Feeling of Stress : Very much  Social Connections: Moderately Isolated (06/02/2021)   Social Connection and Isolation Panel [NHANES]    Frequency of Communication with Friends and Family: More than three times a week    Frequency of Social Gatherings with Friends and Family: More than three times a week    Attends Religious Services: Never    Marine scientist or Organizations: No    Attends Archivist Meetings: Never    Marital Status: Living with partner    Review of Systems Per HPI  Objective:  BP 113/76   Pulse (!) 115   Ht 5' (1.524 m)   Wt 119 lb 9.6 oz (54.3 kg)   LMP 10/23/2022 (Approximate)   SpO2 98%   BMI 23.36 kg/m      11/30/2022    9:17 AM 11/12/2022   11:13 AM 10/13/2022    9:05 AM  BP/Weight  Systolic BP 123456 [id number] [id number]  Diastolic BP 76 75 84  Wt. (Lbs) 119.6    BMI 23.36 kg/m2      Physical Exam Vitals and nursing note reviewed.  Constitutional:      General: She is not in acute distress. HENT:     Head: Normocephalic and atraumatic.  Cardiovascular:     Rate and Rhythm: Regular rhythm. Tachycardia present.  Pulmonary:     Effort: Pulmonary effort is normal. No respiratory distress.  Neurological:     Mental  Status: She is alert.  Psychiatric:     Comments: Anxious.     Lab Results  Component Value Date   WBC 7.7 01/12/2022   HGB 11.4 (L) 01/12/2022   HCT 35.0 (L) 01/12/2022   PLT 404 (H) 01/12/2022   GLUCOSE 92 11/08/2021   CHOL 214 (H) 11/08/2021   TRIG 138 11/08/2021   HDL 36 (L) 11/08/2021   LDLCALC 153 (H) 11/08/2021   ALT 13 11/08/2021   AST 15 11/08/2021   NA 143 11/08/2021   K 3.8 11/08/2021   CL 107 (H) 11/08/2021   CREATININE 0.82 11/08/2021   BUN 6 11/08/2021   CO2 23 11/08/2021   TSH 0.771 11/08/2021   INR 0.98 10/25/2018     Assessment & Plan:   Problem List Items Addressed This Visit       Other   Depression with anxiety - Primary    Uncontrolled and worsening.  Placing referral to behavioral health/psychiatry.  Continue Cymbalta and alprazolam.  Starting Abilify.      Relevant Orders   Ambulatory referral to Psychiatry    Meds ordered this encounter  Medications   ARIPiprazole (ABILIFY) 2 MG tablet    Sig: Take 1 tablet (2 mg total) by mouth daily.    Dispense:  30 tablet    Refill:  [place]

## 2022-11-30 NOTE — Patient Instructions (Signed)
Continue your current medication.  Start Abilify.  I am placing a referral for you.

## 2022-11-30 NOTE — Assessment & Plan Note (Signed)
Uncontrolled and worsening.  Placing referral to behavioral health/psychiatry.  Continue Cymbalta and alprazolam.  Starting Abilify.

## 2022-12-03 ENCOUNTER — Encounter: Payer: Self-pay | Admitting: Family Medicine

## 2022-12-03 DIAGNOSIS — F418 Other specified anxiety disorders: Secondary | ICD-10-CM

## 2022-12-04 ENCOUNTER — Other Ambulatory Visit: Payer: Self-pay | Admitting: Family Medicine

## 2022-12-04 DIAGNOSIS — F418 Other specified anxiety disorders: Secondary | ICD-10-CM

## 2022-12-06 ENCOUNTER — Other Ambulatory Visit: Payer: Self-pay | Admitting: Family Medicine

## 2022-12-06 ENCOUNTER — Encounter: Payer: Self-pay | Admitting: Family Medicine

## 2022-12-06 DIAGNOSIS — F418 Other specified anxiety disorders: Secondary | ICD-10-CM

## 2022-12-06 NOTE — Telephone Encounter (Signed)
If patient needs a refill of Phenergan she may have 1 refill of Phenergan She needs to let us know which pharmacy If it is not Phenergan then the next choice would be Zofran

## 2022-12-07 MED ORDER — PROMETHAZINE HCL 25 MG PO TABS: 12.5000 mg | ORAL_TABLET | Freq: Three times a day (TID) | ORAL | 0 refills | Status: DC | PRN

## 2022-12-07 NOTE — Telephone Encounter (Signed)
Patient requesting refill of Phenergan. Prescription sent electronically to pharmacy.

## 2022-12-17 ENCOUNTER — Telehealth: Payer: Self-pay | Admitting: Family Medicine

## 2022-12-17 NOTE — Telephone Encounter (Signed)
Refill on   ALPRAZolam (XANAX) 0.5 MG table  [facility] to [facility]

## 2022-12-18 ENCOUNTER — Other Ambulatory Visit: Payer: Self-pay | Admitting: Family Medicine

## 2022-12-18 DIAGNOSIS — F418 Other specified anxiety disorders: Secondary | ICD-10-CM

## 2022-12-18 MED ORDER — ALPRAZOLAM 0.5 MG PO TABS: 0.5000 mg | ORAL_TABLET | Freq: Two times a day (BID) | ORAL | 1 refills | Status: DC | PRN

## 2022-12-20 NOTE — Telephone Encounter (Signed)
Cook, Jayce G, DO     Rx sent.   

## 2022-12-24 ENCOUNTER — Other Ambulatory Visit: Payer: Self-pay | Admitting: Family Medicine

## 2022-12-24 ENCOUNTER — Encounter: Payer: Self-pay | Admitting: Family Medicine

## 2022-12-24 DIAGNOSIS — F418 Other specified anxiety disorders: Secondary | ICD-10-CM

## 2022-12-24 MED ORDER — PROMETHAZINE HCL 25 MG PO TABS: 12.5000 mg | ORAL_TABLET | Freq: Three times a day (TID) | ORAL | 0 refills | Status: DC | PRN

## 2023-01-08 ENCOUNTER — Other Ambulatory Visit: Payer: Self-pay | Admitting: Family Medicine

## 2023-01-08 NOTE — Telephone Encounter (Signed)
Pt not at this practice

## 2023-01-09 ENCOUNTER — Ambulatory Visit
Admission: EM | Admit: 2023-01-09 | Discharge: 2023-01-09 | Disposition: A | Discharge status: Home / Self Care | Payer: Medicaid Other | Attending: Family Medicine | Admitting: Family Medicine

## 2023-01-09 DIAGNOSIS — R112 Nausea with vomiting, unspecified: Secondary | ICD-10-CM | POA: Insufficient documentation

## 2023-01-09 DIAGNOSIS — F418 Other specified anxiety disorders: Secondary | ICD-10-CM | POA: Diagnosis present

## 2023-01-09 DIAGNOSIS — Z1152 Encounter for screening for COVID-19: Secondary | ICD-10-CM | POA: Insufficient documentation

## 2023-01-09 DIAGNOSIS — F32A Depression, unspecified: Secondary | ICD-10-CM | POA: Diagnosis not present

## 2023-01-09 DIAGNOSIS — R197 Diarrhea, unspecified: Secondary | ICD-10-CM | POA: Diagnosis present

## 2023-01-09 LAB — POCT URINALYSIS DIP (MANUAL ENTRY)
Bilirubin, UA: NEGATIVE
Blood, UA: NEGATIVE
Glucose, UA: NEGATIVE mg/dL
Ketones, POC UA: NEGATIVE mg/dL
Nitrite, UA: NEGATIVE
Protein Ur, POC: NEGATIVE mg/dL
Spec Grav, UA: 1.025 (ref 1.010–1.025)
Urobilinogen, UA: 1 E.U./dL
pH, UA: 7 (ref 5.0–8.0)

## 2023-01-09 LAB — POCT INFLUENZA A/B
Influenza A, POC: NEGATIVE
Influenza B, POC: NEGATIVE

## 2023-01-09 LAB — POCT URINE PREGNANCY: Preg Test, Ur: NEGATIVE

## 2023-01-09 MED ORDER — PROMETHAZINE HCL 25 MG PO TABS
12.5000 mg | ORAL_TABLET | Freq: Three times a day (TID) | ORAL | 0 refills | Status: DC | PRN
Start: 2023-01-09 — End: 2023-01-25

## 2023-01-09 NOTE — Discharge Instructions (Addendum)
You have had labs (COVID test and a urine culture) sent today. We will call you with any significant abnormalities or if there is need to begin or change treatment or pursue further follow up.  You may also review your test results online through MyChart. If you do not have a MyChart account, instructions to sign up should be on your discharge paperwork.

## 2023-01-09 NOTE — ED Triage Notes (Signed)
Pt c/o having nausea and vomiting since last night, pt states she hs not had any diarrhea, that she has been constipated. Pt states she want's to rule out flu and COVID.

## 2023-01-09 NOTE — ED Provider Notes (Signed)
[facility]   [id number] 01/09/23 Arrival Time: 1029  ASSESSMENT & PLAN:  1. Nausea and vomiting, unspecified vomiting type   2. Depression with anxiety    Meds ordered this encounter  Medications   promethazine (PHENERGAN) 25 MG tablet    Sig: Take 0.5-1 tablets (12.5-25 mg total) by mouth every 8 (eight) hours as needed for nausea or vomiting.    Dispense:  30 tablet    Refill:  0  Chronic use. Has been out of for several days.  Results for orders placed or performed during the hospital encounter of 01/09/23  POCT urinalysis dipstick  Result Value Ref Range   Color, UA yellow yellow   Clarity, UA clear clear   Glucose, UA negative negative mg/dL   Bilirubin, UA negative negative   Ketones, POC UA negative negative mg/dL   Spec Grav, UA 4.098 1.191 - 1.025   Blood, UA negative negative   pH, UA 7.0 5.0 - 8.0   Protein Ur, POC negative negative mg/dL   Urobilinogen, UA 1.0 0.2 or 1.0 E.U./dL   Nitrite, UA Negative Negative   Leukocytes, UA Trace (A) Negative  POCT urine pregnancy  Result Value Ref Range   Preg Test, Ur Negative Negative  POCT Influenza A/B  Result Value Ref Range   Influenza A, POC Negative Negative   Influenza B, POC Negative Negative   Urine culture sent.   Follow-up Information     [name], DO.   Specialty: Family Medicine Why: If worsening or failing to improve as anticipated. Contact information: [address] [place] [address] [phone]                Reviewed expectations re: course of current medical issues. Questions answered. Outlined signs and symptoms indicating need for more acute intervention. Patient verbalized understanding. After Visit Summary given.   SUBJECTIVE: History from: patient. Roberta Baker is a 37 y.o. female who presents with complaint of non-bilious, non-bloody intermittent n/v without non-bloody diarrhea. H/O chronic nausea that she relates to cymbalta use. Since  last evening with fairly persistent n/v; slightly better today. Has been out of phenergan for a few days. Desires to r/o influenza/COVID. Denies fever/abd pain. Normal bowel/bladder habits.  Patient's last menstrual period was 12/26/2022.  Past Surgical History:  Procedure Laterality Date   BREAST ENHANCEMENT SURGERY     BREAST SURGERY     LAPAROSCOPIC BILATERAL SALPINGECTOMY Bilateral 01/16/2022   Procedure: LAPAROSCOPIC BILATERAL SALPINGECTOMY;  Surgeon: Myna Hidalgo, DO;  Location: [facility];  Service: Gynecology;  Laterality: Bilateral;   LEEP N/A 01/16/2022   Procedure: LOOP ELECTROSURGICAL EXCISION PROCEDURE (LEEP);  Surgeon: Myna Hidalgo, DO;  Location: [facility];  Service: Gynecology;  Laterality: N/A;   OBJECTIVE:  Vitals:   01/09/23 1032  BP: 103/64  Pulse: 98  Resp: 16  Temp: 98.2 F (36.8 C)  TempSrc: Oral  SpO2: 98%    General appearance: alert; no distress Oropharynx: moist Lungs: clear to auscultation bilaterally; unlabored Heart: regular rate and rhythm Abdomen: soft Extremities: no edema; symmetrical with no gross deformities Skin: warm; dry Neurologic: normal gait Psychological: alert and cooperative; normal mood and affect  Labs: Results for orders placed or performed during the hospital encounter of 01/09/23  POCT urinalysis dipstick  Result Value Ref Range   Color, UA yellow yellow   Clarity, UA clear clear   Glucose, UA negative negative mg/dL   Bilirubin, UA negative negative   Ketones, POC UA negative negative mg/dL  Spec Grav, UA 1.025 1.010 - 1.025   Blood, UA negative negative   pH, UA 7.0 5.0 - 8.0   Protein Ur, POC negative negative mg/dL   Urobilinogen, UA 1.0 0.2 or 1.0 E.U./dL   Nitrite, UA Negative Negative   Leukocytes, UA Trace (A) Negative  POCT urine pregnancy  Result Value Ref Range   Preg Test, Ur Negative Negative  POCT Influenza A/B  Result Value Ref Range   Influenza A, POC Negative Negative   Influenza B, POC Negative  Negative   Labs Reviewed  POCT URINALYSIS DIP (MANUAL ENTRY) - Abnormal; Notable for the following components:      Result Value   Leukocytes, UA Trace (*)    All other components within normal limits  SARS CORONAVIRUS 2 (TAT 6-24 HRS)  URINE CULTURE  POCT URINE PREGNANCY  POCT INFLUENZA A/B   Allergies  Allergen Reactions   Buspar [Buspirone] Other (See Comments)    Caused nightmares/hallucinations   Sulfa Antibiotics Nausea And Vomiting                                               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   Social History   Socioeconomic History   Marital status: Single    Spouse name: Not on file   Number of children: 1   Years of education: Not on file   Highest education level: Not on file  Occupational History   Not on file  Tobacco Use   Smoking status: Every Day    Packs/day: 0.75    Years: 0.50    Additional pack years: 0.00    Total pack years: 0.38    Types: Cigarettes   Smokeless tobacco: Never  Vaping Use   Vaping Use: Never used  Substance and Sexual Activity   Alcohol use: No   Drug use: Not Currently    Types: Marijuana    Comment: occasional   Sexual activity: Not Currently    Birth control/protection: Surgical    Comment: tubal  Other Topics Concern   Not on file  Social History Narrative   Not on file   Social Determinants of Health   Financial Resource Strain: Unknown (06/02/2021)   Overall Financial Resource Strain (CARDIA)    Difficulty of Paying Living Expenses: Patient declined  Food Insecurity: Food Insecurity Present (06/02/2021)   Hunger Vital Sign    Worried About Running Out of Food in the Last Year: Often true    Ran Out of Food in the Last Year:  Sometimes true  Transportation Needs: No Transportation Needs (06/02/2021)   PRAPARE - Administrator, Civil Service (Medical): No    Lack of Transportation (Non-Medical): No  Physical Activity: Sufficiently Active (06/02/2021)   Exercise Vital Sign    Days of Exercise per Week: 7 days    Minutes of Exercise per Session: 100 min  Stress: Stress Concern Present (06/02/2021)   [employer] of Occupational Health - Occupational Stress Questionnaire  Feeling of Stress : Very much  Social Connections: Moderately Isolated (06/02/2021)   Social Connection and Isolation Panel [NHANES]    Frequency of Communication with Friends and Family: More than three times a week    Frequency of Social Gatherings with Friends and Family: More than three times a week    Attends Religious Services: Never    Database administrator or Organizations: No    Attends Banker Meetings: Never    Marital Status: Living with partner  Intimate Partner Violence: Not At Risk (06/02/2021)   Humiliation, Afraid, Rape, and Kick questionnaire    Fear of Current or Ex-Partner: No    Emotionally Abused: No    Physically Abused: No    Sexually Abused: No   Family History  Problem Relation Age of Onset   Cancer Maternal Grandmother        breast cancer   Heart attack Maternal Grandfather    Mental illness Mother    Seizures Mother    Cancer Maternal Guido Sander, MD 01/09/23 1214

## 2023-01-10 LAB — URINE CULTURE: Culture: <10000 — AB

## 2023-01-10 LAB — SARS CORONAVIRUS 2 (TAT 6-24 HRS): SARS Coronavirus 2: NEGATIVE

## 2023-01-25 ENCOUNTER — Other Ambulatory Visit: Payer: Self-pay | Admitting: Nurse Practitioner

## 2023-01-25 ENCOUNTER — Encounter: Payer: Self-pay | Admitting: Family Medicine

## 2023-01-25 DIAGNOSIS — F418 Other specified anxiety disorders: Secondary | ICD-10-CM

## 2023-01-25 MED ORDER — PROMETHAZINE HCL 25 MG PO TABS
12.5000 mg | ORAL_TABLET | Freq: Three times a day (TID) | ORAL | 0 refills | Status: DC | PRN
Start: 2023-01-25 — End: 2023-02-01

## 2023-02-01 ENCOUNTER — Telehealth: Payer: Self-pay | Admitting: Family Medicine

## 2023-02-01 ENCOUNTER — Encounter: Payer: Self-pay | Admitting: Emergency Medicine

## 2023-02-01 ENCOUNTER — Ambulatory Visit
Admission: EM | Admit: 2023-02-01 | Discharge: 2023-02-01 | Disposition: A | Discharge status: Home / Self Care | Payer: Medicaid Other | Attending: Internal Medicine | Admitting: Internal Medicine

## 2023-02-01 DIAGNOSIS — H6692 Otitis media, unspecified, left ear: Secondary | ICD-10-CM

## 2023-02-01 MED ORDER — AMOXICILLIN-POT CLAVULANATE 875-125 MG PO TABS: 1.0000 | ORAL_TABLET | Freq: Two times a day (BID) | ORAL | 0 refills | Status: DC

## 2023-02-01 MED ORDER — PROMETHAZINE HCL 25 MG PO TABS: 25.0000 mg | ORAL_TABLET | Freq: Four times a day (QID) | ORAL | 0 refills | Status: DC | PRN

## 2023-02-01 NOTE — Telephone Encounter (Signed)
Patient dropped off a sheet to be completed and also needing a letter stating why she needing a kitten as a support animal for anxiety and depression.

## 2023-02-01 NOTE — ED Triage Notes (Signed)
Left ear pain since yesterday.  Throat pain since this morning.

## 2023-02-01 NOTE — ED Provider Notes (Signed)
[facility]    CSN: [id number] Arrival date & time: 02/01/23  1628      History   Chief Complaint No chief complaint on file.   HPI Roberta Baker is a 37 y.o. female who presents with L ear pain since yesterday and ST onset today. Denies fever or URI    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   Depression with anxiety 12/12/2016    Past Surgical History:  Procedure Laterality Date   BREAST ENHANCEMENT SURGERY     BREAST SURGERY     LAPAROSCOPIC BILATERAL SALPINGECTOMY Bilateral 01/16/2022   Procedure: LAPAROSCOPIC BILATERAL SALPINGECTOMY;  Surgeon: Myna Hidalgo, DO;  Location: [facility];  Service: Gynecology;  Laterality: Bilateral;   LEEP N/A 01/16/2022   Procedure: LOOP ELECTROSURGICAL EXCISION PROCEDURE (LEEP);  Surgeon: Myna Hidalgo, DO;  Location: [facility];  Service: Gynecology;  Laterality: N/A;    OB History     Gravida  2   Para  2   Term  2   Preterm      AB  0   Living  2      SAB      IAB      Ectopic      Multiple  0   Live Births  2            Home Medications    Prior to Admission medications   Medication Sig Start Date End Date Taking? Authorizing Provider  amoxicillin-clavulanate (AUGMENTIN) 875-125 MG tablet Take 1 tablet by mouth every 12 (twelve) hours. 02/01/23  Yes Rodriguez-Southworth, Nettie Elm, PA-C  promethazine (PHENERGAN) 25 MG tablet Take 1 tablet (25 mg total) by mouth every 6 (six)  hours as needed for nausea or vomiting. 02/01/23  Yes Rodriguez-Southworth, Nettie Elm, PA-C  ALPRAZolam (XANAX) 0.5 MG tablet Take 1 tablet (0.5 mg total) by mouth 2 (two) times daily as needed for anxiety. 12/18/22   [name], DO  ARIPiprazole (ABILIFY) 2 MG tablet Take 1 tablet (2 mg total) by mouth daily. 11/30/22   [name], DO  DULoxetine (CYMBALTA) 30 MG capsule Take 3 capsules (90 mg total) by mouth daily. 11/22/22   [name], DO  tetrahydrozoline-zinc (VISINE-AC) 0.05-0.25 % ophthalmic solution Place 2 drops into both eyes 3 (three) times daily as needed (itchy eyes).    [provider]    Family History Family History  Problem Relation Age of Onset   Cancer Maternal Grandmother        breast cancer   Heart attack Maternal Grandfather    Mental illness Mother    Seizures Mother    Cancer Maternal Uncle     Social History Social History   Tobacco Use   Smoking status: Every Day    Packs/day: 0.75    Years: 0.50    Additional pack years: 0.00  Total pack years: 0.38    Types: Cigarettes   Smokeless tobacco: Never  Vaping Use   Vaping Use: Never used  Substance Use Topics   Alcohol use: No   Drug use: Not Currently    Types: Marijuana    Comment: occasional     Allergies   Buspar [buspirone] and Sulfa antibiotics   Review of Systems Review of Systems  As noted in HPI Physical Exam Triage Vital Signs ED Triage Vitals [02/01/23 1633]  Enc Vitals Group     BP 106/72     Pulse Rate (!) 108     Resp 18     Temp 98.2 F (36.8 C)     Temp Source Oral     SpO2 98 %     Weight      Height      Head Circumference      Peak Flow      Pain Score 9     Pain Loc      Pain Edu?      Excl. in GC?    No data found.  Updated Vital Signs BP 106/72 (BP Location: Right Arm)   Pulse (!) 108   Temp 98.2 F (36.8 C) (Oral)   Resp 18   LMP 01/26/2023 (Exact Date)   SpO2 98%   Visual Acuity Right Eye Distance:   Left Eye Distance:    Bilateral Distance:    Right Eye Near:   Left Eye Near:    Bilateral Near:     Physical Exam Constitutional:      General: She is not in acute distress.    Appearance: She is not ill-appearing or toxic-appearing.  HENT:     Right Ear: Tympanic membrane, ear canal and external ear normal.     Left Ear: External ear normal. Tympanic membrane is erythematous.     Mouth/Throat:     Mouth: Mucous membranes are moist.     Pharynx: Oropharynx is clear.  Eyes:     General: No scleral icterus.    Conjunctiva/sclera: Conjunctivae normal.  Musculoskeletal:        General: Normal range of motion.     Cervical back: Neck supple.  Lymphadenopathy:     Cervical: Cervical adenopathy present.  Skin:    General: Skin is warm and dry.  Neurological:     Mental Status: She is alert and oriented to person, place, and time.     Gait: Gait normal.  Psychiatric:        Mood and Affect: Mood normal.        Behavior: Behavior normal.        Thought Content: Thought content normal.        Judgment: Judgment normal.      UC Treatments / Results  Labs (all labs ordered are listed, but only abnormal results are displayed) Labs Reviewed - No data to display  EKG   Radiology No results found.  Procedures Procedures (including critical care time)  Medications Ordered in UC Medications - No data to display  Initial Impression / Assessment and Plan / UC Course  I have reviewed the triage vital signs and the nursing notes.  Acute L OM Pharyngitis  I placed her on Augmentin as noted.    Final Clinical Impressions(s) / UC Diagnoses   Final diagnoses:  Acute otitis media, left   Discharge Instructions   None    ED Prescriptions     Medication Sig Dispense Auth. Provider  promethazine (PHENERGAN) 25 MG tablet Take 1 tablet (25 mg total) by mouth every 6 (six) hours as needed for nausea or vomiting. 14 tablet Rodriguez-Southworth, Mariaguadalupe Fialkowski, PA-C   amoxicillin-clavulanate  (AUGMENTIN) 875-125 MG tablet Take 1 tablet by mouth every 12 (twelve) hours. 14 tablet Rodriguez-Southworth, Nettie Elm, PA-C      PDMP not reviewed this encounter.   Garey Ham, [place] 02/01/23 1720

## 2023-02-08 ENCOUNTER — Encounter: Payer: Self-pay | Admitting: Family Medicine

## 2023-02-08 ENCOUNTER — Other Ambulatory Visit: Payer: Self-pay

## 2023-02-08 ENCOUNTER — Ambulatory Visit: Payer: Medicaid Other | Admitting: Family Medicine

## 2023-02-08 VITALS — BP 102/58 | HR 104 | Temp 98.2°F | Ht 60.0 in | Wt 120.0 lb

## 2023-02-08 DIAGNOSIS — F418 Other specified anxiety disorders: Secondary | ICD-10-CM | POA: Diagnosis not present

## 2023-02-08 MED ORDER — PROMETHAZINE HCL 25 MG PO TABS: 25.0000 mg | ORAL_TABLET | Freq: Four times a day (QID) | ORAL | 1 refills | Status: DC | PRN

## 2023-02-08 NOTE — Progress Notes (Signed)
Subjective:  Patient ID: Roberta Baker, female    DOB: 08/14/1986  Age: 37 y.o. MRN: [id number]  CC: Chief Complaint  Patient presents with   support animal letter    HPI:  37 year old female presents for evaluation of the above.  Patient has significant/severe underlying depression and anxiety.  She is on alprazolam and duloxetine.  She has recently acquired a kitten.  This has significantly benefited her and improved her anxiety.  Her apartment complex requires a form to be filled out to accommodate this.  She has a medical indication given her depression and anxiety.  This meets DSM-V criteria.  We will discuss this today.  She has forms to be filled out.  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   Depression with anxiety 12/12/2016    Social Hx   Social History   Socioeconomic History   Marital status: Single    Spouse name: Not on file   Number of children: 1   Years of education: Not on file   Highest education level: Some college, no degree  Occupational History   Not on file  Tobacco Use   Smoking status: Every Day    Packs/day: 0.75    Years: 0.50    Additional pack years: 0.00    Total pack years: 0.38    Types: Cigarettes   Smokeless tobacco: Never  Vaping Use   Vaping Use: Never used  Substance and Sexual Activity   Alcohol use: No   Drug use: Not Currently    Types: Marijuana    Comment: occasional   Sexual activity: Not Currently    Birth control/protection: Surgical    Comment: tubal  Other Topics Concern   Not on file  Social History Narrative   Not on file   Social Determinants of Health   Financial Resource Strain: Low Risk  (02/08/2023)   Overall Financial Resource Strain (CARDIA)    Difficulty of Paying Living  Expenses: Not hard at all  Food Insecurity: No Food Insecurity (02/08/2023)   Hunger Vital Sign    Worried About Running Out of Food in the Last Year: Never true    Ran Out of Food in the Last Year: Never true  Transportation Needs: No Transportation Needs (02/08/2023)   PRAPARE - Administrator, Civil Service (Medical): No    Lack of Transportation (Non-Medical): No  Physical Activity: Insufficiently Active (02/08/2023)   Exercise Vital Sign    Days of Exercise per Week: 4 days    Minutes of Exercise per Session: 30 min  Stress: Stress Concern Present (02/08/2023)   [employer] of Occupational Health - Occupational Stress Questionnaire    Feeling of Stress : Rather much  Social Connections: Moderately Isolated (06/02/2021)   Social Connection and Isolation Panel [NHANES]    Frequency of Communication with Friends and Family: More than three times a week    Frequency of Social Gatherings with Friends and Family: More than three times a week    Attends Religious Services: Never    Database administrator or Organizations: No    Attends Banker Meetings: Never    Marital Status: Living with partner    Review of Systems Per HPI  Objective:  BP (!) 102/58  Pulse (!) 104   Temp 98.2 F (36.8 C)   Ht 5' (1.524 m)   Wt 120 lb (54.4 kg)   LMP 01/26/2023 (Exact Date)   SpO2 99%   BMI 23.44 kg/m      02/08/2023    8:36 AM 02/01/2023    4:33 PM 01/09/2023   10:32 AM  BP/Weight  Systolic BP 102 106 103  Diastolic BP 58 72 64  Wt. (Lbs) 120    BMI 23.44 kg/m2      Physical Exam Vitals and nursing note reviewed.  Constitutional:      General: She is not in acute distress.    Appearance: Normal appearance.  Eyes:     General:        Right eye: No discharge.        Left eye: No discharge.     Conjunctiva/sclera: Conjunctivae normal.  Pulmonary:     Effort: Pulmonary effort is normal. No respiratory distress.  Neurological:     Mental Status: She is  alert.  Psychiatric:     Comments: Flat affect.     Lab Results  Component Value Date   WBC 7.7 01/12/2022   HGB 11.4 (L) 01/12/2022   HCT 35.0 (L) 01/12/2022   PLT 404 (H) 01/12/2022   GLUCOSE 92 11/08/2021   CHOL 214 (H) 11/08/2021   TRIG 138 11/08/2021   HDL 36 (L) 11/08/2021   LDLCALC 153 (H) 11/08/2021   ALT 13 11/08/2021   AST 15 11/08/2021   NA 143 11/08/2021   K 3.8 11/08/2021   CL 107 (H) 11/08/2021   CREATININE 0.82 11/08/2021   BUN 6 11/08/2021   CO2 23 11/08/2021   TSH 0.771 11/08/2021   INR 0.98 10/25/2018     Assessment & Plan:   Problem List Items Addressed This Visit       Other   Depression with anxiety - Primary    Given patient's severe depression and anxiety, I feel that she should be allowed support animal/animal in her apartment complex as it does significantly help her anxiety and will help with future care.  Form filled out today.      Everlene Other DO [name]

## 2023-02-08 NOTE — Assessment & Plan Note (Signed)
Given patient's severe depression and anxiety, I feel that she should be allowed support animal/animal in her apartment complex as it does significantly help her anxiety and will help with future care.  Form filled out today.

## 2023-02-19 ENCOUNTER — Encounter: Payer: Self-pay | Admitting: Family Medicine

## 2023-02-19 ENCOUNTER — Other Ambulatory Visit: Payer: Self-pay | Admitting: Family Medicine

## 2023-02-19 DIAGNOSIS — F418 Other specified anxiety disorders: Secondary | ICD-10-CM

## 2023-02-19 MED ORDER — ALPRAZOLAM 0.5 MG PO TABS
0.5000 mg | ORAL_TABLET | Freq: Two times a day (BID) | ORAL | 3 refills | Status: AC | PRN
Start: 2023-02-19 — End: ?

## 2023-03-04 ENCOUNTER — Ambulatory Visit: Payer: Medicaid Other | Admitting: Family Medicine

## 2023-04-03 ENCOUNTER — Ambulatory Visit (INDEPENDENT_AMBULATORY_CARE_PROVIDER_SITE_OTHER): Payer: Medicaid Other | Admitting: Family Medicine

## 2023-04-03 DIAGNOSIS — F418 Other specified anxiety disorders: Secondary | ICD-10-CM | POA: Diagnosis not present

## 2023-04-03 MED ORDER — DULOXETINE HCL 30 MG PO CPEP: 90.0000 mg | ORAL_CAPSULE | Freq: Every day | ORAL | 1 refills | Status: DC

## 2023-04-03 NOTE — Patient Instructions (Signed)
Continue your medications.  Consider counseling.  Follow up in 6 months. Call with concerns.

## 2023-04-04 ENCOUNTER — Encounter: Payer: Self-pay | Admitting: Family Medicine

## 2023-04-04 ENCOUNTER — Other Ambulatory Visit: Payer: Self-pay | Admitting: Family Medicine

## 2023-04-04 DIAGNOSIS — R110 Nausea: Secondary | ICD-10-CM

## 2023-04-04 NOTE — Progress Notes (Signed)
Subjective:  Patient ID: Roberta Baker, female    DOB: [birth year]  Age: 37 y.o. MRN: [id number]  CC:  Follow up  HPI:  37 year old female presents for follow up.  Patient reports recent worsening depression.  States that she has little interest in doing things.  Has trouble falling asleep.  Patient is currently on Cymbalta and alprazolam.  Previously referred to psychiatry.  Referral was closed as they could not get all to the patient.  Patient states that she does not wish to make any medication changes at this time.  Will discuss counseling.  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   Depression with anxiety 12/12/2016    Social Hx   Social History   Socioeconomic History   Marital status: Single    Spouse name: Not on file   Number of children: 1   Years of education: Not on file   Highest education level: Some college, no degree  Occupational History   Not on file  Tobacco Use   Smoking status: Every Day    Current packs/day: 0.75    Average packs/day: 0.8 packs/day for 0.5 years (0.4 ttl pk-yrs)    Types: Cigarettes   Smokeless tobacco: Never  Vaping Use   Vaping status: Never Used  Substance and Sexual Activity   Alcohol use: No   Drug use: Not Currently    Types: Marijuana    Comment: occasional   Sexual activity: Not Currently    Birth control/protection: Surgical    Comment: tubal  Other Topics Concern   Not on file  Social History Narrative   Not on file   Social Determinants of Health   Financial Resource Strain: Low Risk  (02/08/2023)   Overall Financial Resource Strain (CARDIA)    Difficulty of Paying Living Expenses: Not hard at all  Food Insecurity: No Food Insecurity (02/08/2023)   Hunger Vital Sign    Worried About Running  Out of Food in the Last Year: Never true    Ran Out of Food in the Last Year: Never true  Transportation Needs: No Transportation Needs (02/08/2023)   PRAPARE - Administrator, Civil Service (Medical): No    Lack of Transportation (Non-Medical): No  Physical Activity: Insufficiently Active (02/08/2023)   Exercise Vital Sign    Days of Exercise per Week: 4 days    Minutes of Exercise per Session: 30 min  Stress: Stress Concern Present (02/08/2023)   [employer] of Occupational Health - Occupational Stress Questionnaire    Feeling of Stress : Rather much  Social Connections: Moderately Isolated (06/02/2021)   Social Connection and Isolation Panel [NHANES]    Frequency of Communication with Friends and Family: More than three times a week    Frequency of Social Gatherings with Friends and Family: More than three times a week    Attends Religious Services: Never    Database administrator or Organizations: No    Attends Banker Meetings: Never    Marital Status: Living with partner    Review of Systems Per HPI  Objective:  BP 106/70   Pulse 97   Temp 98.2 F (36.8 C)   Ht 5' (1.524 m)   Wt 124 lb 9.6 oz (56.5 kg)  SpO2 98%   BMI 24.33 kg/m      04/03/2023    8:58 AM 02/08/2023    8:36 AM 02/01/2023    4:33 PM  BP/Weight  Systolic BP 106 102 106  Diastolic BP 70 58 72  Wt. (Lbs) 124.6 120   BMI 24.33 kg/m2 23.44 kg/m2     Physical Exam Vitals reviewed.  Constitutional:      General: She is not in acute distress.    Appearance: Normal appearance.  HENT:     Head: Normocephalic and atraumatic.  Cardiovascular:     Rate and Rhythm: Normal rate and regular rhythm.  Pulmonary:     Effort: Pulmonary effort is normal.     Breath sounds: Normal breath sounds.  Neurological:     Mental Status: She is alert.  Psychiatric:     Comments: Flat affect.     Lab Results  Component Value Date   WBC 7.7 01/12/2022   HGB 11.4 (L) 01/12/2022   HCT  35.0 (L) 01/12/2022   PLT 404 (H) 01/12/2022   GLUCOSE 92 11/08/2021   CHOL 214 (H) 11/08/2021   TRIG 138 11/08/2021   HDL 36 (L) 11/08/2021   LDLCALC 153 (H) 11/08/2021   ALT 13 11/08/2021   AST 15 11/08/2021   NA 143 11/08/2021   K 3.8 11/08/2021   CL 107 (H) 11/08/2021   CREATININE 0.82 11/08/2021   BUN 6 11/08/2021   CO2 23 11/08/2021   TSH 0.771 11/08/2021   INR 0.98 10/25/2018     Assessment & Plan:   Problem List Items Addressed This Visit       Other   Depression with anxiety    Recent worsening.  Patient declines medication changes/alterations.  Recommended counseling.  Patient will consider.      Relevant Medications   DULoxetine (CYMBALTA) 30 MG capsule    Meds ordered this encounter  Medications   DULoxetine (CYMBALTA) 30 MG capsule    Sig: Take 3 capsules (90 mg total) by mouth daily.    Dispense:  270 capsule    Refill:  1    Follow-up:  Return in about 6 months (around 10/04/2023).  Everlene Other DO [name]

## 2023-04-04 NOTE — Assessment & Plan Note (Signed)
Recent worsening.  Patient declines medication changes/alterations.  Recommended counseling.  Patient will consider.

## 2023-04-05 ENCOUNTER — Encounter: Payer: Self-pay | Admitting: Family Medicine

## 2023-04-05 ENCOUNTER — Other Ambulatory Visit: Payer: Self-pay | Admitting: Family Medicine

## 2023-04-05 MED ORDER — ONDANSETRON HCL 8 MG PO TABS: 8.0000 mg | ORAL_TABLET | Freq: Three times a day (TID) | ORAL | 0 refills | Status: DC | PRN

## 2023-04-05 NOTE — Telephone Encounter (Signed)
Patient notified and verbalized understanding. Referral ordered in EPIC. Patient wanted to know if there was a different med she could take to help with her nausea till she sees GI- she has been having to take meds multiple times a day everyday

## 2023-04-05 NOTE — Telephone Encounter (Signed)
Everlene Other G, DO     She shouldn't be needing it this much. No refill. Recommend referral to GI.

## 2023-04-11 ENCOUNTER — Other Ambulatory Visit: Payer: Self-pay | Admitting: Family Medicine

## 2023-04-11 ENCOUNTER — Encounter: Payer: Self-pay | Admitting: Family Medicine

## 2023-04-11 MED ORDER — PROMETHAZINE HCL 25 MG PO TABS: 25.0000 mg | ORAL_TABLET | Freq: Three times a day (TID) | ORAL | 0 refills | Status: DC | PRN

## 2023-04-18 ENCOUNTER — Encounter: Payer: Self-pay | Admitting: Internal Medicine

## 2023-04-18 ENCOUNTER — Telehealth: Payer: Self-pay | Admitting: *Deleted

## 2023-04-18 ENCOUNTER — Ambulatory Visit: Payer: Medicaid Other | Admitting: Internal Medicine

## 2023-04-18 VITALS — BP 99/68 | HR 100 | Temp 98.4°F | Ht 60.0 in | Wt 122.0 lb

## 2023-04-18 DIAGNOSIS — R112 Nausea with vomiting, unspecified: Secondary | ICD-10-CM | POA: Diagnosis not present

## 2023-04-18 DIAGNOSIS — R140 Abdominal distension (gaseous): Secondary | ICD-10-CM

## 2023-04-18 NOTE — Telephone Encounter (Signed)
[facility]  EGD, asa 2, Dr.[name]

## 2023-04-18 NOTE — Progress Notes (Signed)
Primary Care Physician:  Roberta Sams, DO Primary Gastroenterologist:  Dr. Marletta Lor  Chief Complaint  Patient presents with   New Patient (Initial Visit)    Pt referred for nausea everyday    HPI:   Roberta Baker is a 37 y.o. female who presents to clinic today by referral from her PCP Dr. Adriana Simas for evaluation for chronic nausea.  Patient states her symptoms started after having her first child 6 years ago.  Notes daily nausea, occasional vomiting.  Not particularly worse at any point during the day.  Takes Phenergan which keeps her symptoms at bay.  Takes this medication every day.  Believes her Cymbalta may be contributing slightly though he states her nausea occurred before this.  Does note marijuana use but states this is very seldom approximately 1-2 times per month.  No epigastric or chest pain.  No heartburn or reflux.  States her bowels are moving well, no issues with constipation or diarrhea.  No melena hematochezia.  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    Past Surgical History:  Procedure Laterality Date   BREAST ENHANCEMENT SURGERY     BREAST SURGERY     LAPAROSCOPIC BILATERAL SALPINGECTOMY Bilateral 01/16/2022   Procedure: LAPAROSCOPIC BILATERAL SALPINGECTOMY;  Surgeon: Myna Hidalgo, DO;  Location: [facility];  Service: Gynecology;  Laterality: Bilateral;   LEEP N/A 01/16/2022   Procedure: LOOP ELECTROSURGICAL EXCISION PROCEDURE (LEEP);  Surgeon: Myna Hidalgo, DO;  Location: [facility];  Service: Gynecology;  Laterality: N/A;    Current Outpatient Medications  Medication Sig Dispense Refill   ALPRAZolam (XANAX) 0.5 MG tablet Take 1 tablet (0.5 mg total) by mouth  2 (two) times daily as needed for anxiety. 60 tablet 3   DULoxetine (CYMBALTA) 30 MG capsule Take 3 capsules (90 mg total) by mouth daily. 270 capsule 1   ondansetron (ZOFRAN) 8 MG tablet Take 1 tablet (8 mg total) by mouth every 8 (eight) hours as needed for nausea or vomiting. 60 tablet 0   promethazine (PHENERGAN) 25 MG tablet Take 1 tablet (25 mg total) by mouth every 8 (eight) hours as needed for nausea or vomiting. 90 tablet 0   tetrahydrozoline-zinc (VISINE-AC) 0.05-0.25 % ophthalmic solution Place 2 drops into both eyes 3 (three) times daily as needed (itchy eyes).     No current facility-administered medications for this visit.    Allergies as of 04/18/2023 - Review Complete 04/18/2023  Allergen Reaction Noted   Buspar [buspirone] Other (See Comments) 11/08/2021   Sulfa antibiotics Nausea And Vomiting 09/28/2012    Family History  Problem Relation Age of Onset   Cancer Maternal Grandmother        breast cancer   Heart attack Maternal Grandfather    Mental illness Mother    Seizures Mother    Cancer Maternal Uncle     Social History   Socioeconomic History   Marital status: Single    Spouse name: Not on file   Number of children: 1   Years of education: Not on file   Highest education level: Some college, no degree  Occupational History  Not on file  Tobacco Use   Smoking status: Every Day    Current packs/day: 0.75    Average packs/day: 0.8 packs/day for 0.5 years (0.4 ttl pk-yrs)    Types: Cigarettes   Smokeless tobacco: Never  Vaping Use   Vaping status: Never Used  Substance and Sexual Activity   Alcohol use: No   Drug use: Not Currently    Types: Marijuana    Comment: occasional   Sexual activity: Not Currently    Birth control/protection: Surgical    Comment: tubal  Other Topics Concern   Not on file  Social History Narrative   Not on file   Social Determinants of Health   Financial Resource Strain: Low Risk  (02/08/2023)   Overall Financial  Resource Strain (CARDIA)    Difficulty of Paying Living Expenses: Not hard at all  Food Insecurity: No Food Insecurity (02/08/2023)   Hunger Vital Sign    Worried About Running Out of Food in the Last Year: Never true    Ran Out of Food in the Last Year: Never true  Transportation Needs: No Transportation Needs (02/08/2023)   PRAPARE - Administrator, Civil Service (Medical): No    Lack of Transportation (Non-Medical): No  Physical Activity: Insufficiently Active (02/08/2023)   Exercise Vital Sign    Days of Exercise per Week: 4 days    Minutes of Exercise per Session: 30 min  Stress: Stress Concern Present (02/08/2023)   [employer] of Occupational Health - Occupational Stress Questionnaire    Feeling of Stress : Rather much  Social Connections: Moderately Isolated (06/02/2021)   Social Connection and Isolation Panel [NHANES]    Frequency of Communication with Friends and Family: More than three times a week    Frequency of Social Gatherings with Friends and Family: More than three times a week    Attends Religious Services: Never    Database administrator or Organizations: No    Attends Banker Meetings: Never    Marital Status: Living with partner  Intimate Partner Violence: Not At Risk (06/02/2021)   Humiliation, Afraid, Rape, and Kick questionnaire    Fear of Current or Ex-Partner: No    Emotionally Abused: No    Physically Abused: No    Sexually Abused: No    Subjective: Review of Systems  Constitutional:  Negative for chills and fever.  HENT:  Negative for congestion and hearing loss.   Eyes:  Negative for blurred vision and double vision.  Respiratory:  Negative for cough and shortness of breath.   Cardiovascular:  Negative for chest pain and palpitations.  Gastrointestinal:  Positive for nausea. Negative for abdominal pain, blood in stool, constipation, diarrhea, heartburn, melena and vomiting.  Genitourinary:  Negative for dysuria and urgency.   Musculoskeletal:  Negative for joint pain and myalgias.  Skin:  Negative for itching and rash.  Neurological:  Negative for dizziness and headaches.  Psychiatric/Behavioral:  Negative for depression. The patient is not nervous/anxious.        Objective: BP 99/68   Pulse 100   Temp 98.4 F (36.9 C)   Ht 5' (1.524 m)   Wt 122 lb (55.3 kg)   LMP 04/10/2023   BMI 23.83 kg/m  Physical Exam Constitutional:      Appearance: Normal appearance.  HENT:     Head: Normocephalic and atraumatic.  Eyes:     Extraocular Movements: Extraocular movements intact.     Conjunctiva/sclera: Conjunctivae normal.  Cardiovascular:  Rate and Rhythm: Normal rate and regular rhythm.  Pulmonary:     Effort: Pulmonary effort is normal.     Breath sounds: Normal breath sounds.  Abdominal:     General: Bowel sounds are normal.     Palpations: Abdomen is soft.  Musculoskeletal:        General: No swelling. Normal range of motion.     Cervical back: Normal range of motion and neck supple.  Skin:    General: Skin is warm and dry.     Coloration: Skin is not jaundiced.  Neurological:     General: No focal deficit present.     Mental Status: She is alert and oriented to person, place, and time.  Psychiatric:        Mood and Affect: Mood normal.        Behavior: Behavior normal.      Assessment: *Nausea and vomiting *Abdominal bloating  Plan: Etiology of patient's symptoms unclear.  Given that this has been going on for 6 years, Will schedule for EGD to evaluate for peptic ulcer disease, esophagitis, gastritis, H. Pylori, duodenitis, or other. Will also evaluate for esophageal stricture, Schatzki's ring, esophageal web or other.   The risks including infection, bleed, or perforation as well as benefits, limitations, alternatives and imponderables have been reviewed with the patient. Potential for esophageal dilation, biopsy, etc. have also been reviewed.  Questions have been answered. All  parties agreeable.  Check blood work today including celiac testing, CRP/ESR.  TSH WNL.  Limit marijuana use.  Continue Phenergan as needed.  If upper endoscopy unremarkable, can consider right upper quadrant ultrasound and/or gastric emptying study.  Thank you Dr. Adriana Simas for the kind referral  04/18/2023 9:32 AM   Disclaimer: This note was dictated with voice recognition software. Similar sounding words can inadvertently be transcribed and may not be corrected upon review.

## 2023-04-18 NOTE — Patient Instructions (Signed)
I am going to check blood work today at [facility] including screen you for celiac disease as well as check a few inflammatory markers.  We will schedule you for upper endoscopy to further evaluate your symptoms.  If upper endoscopy unremarkable, will consider ultrasound of your gallbladder.    Limit marijuana use as best as you can.  Continue on Phenergan as needed.  It was very nice meeting both you today.  Dr. Marletta Lor

## 2023-04-23 ENCOUNTER — Encounter: Payer: Self-pay | Admitting: Internal Medicine

## 2023-04-23 LAB — CELIAC AB TTG DGP TIGA
Antigliadin Abs, IgA: 51 U — ABNORMAL HIGH (ref 0–19)
Gliadin IgG: 61 U — ABNORMAL HIGH (ref 0–19)
IgA/Immunoglobulin A, Serum: 164 mg/dL (ref 87–352)
Tissue Transglut Ab: 6 U/mL — ABNORMAL HIGH (ref 0–5)
Transglutaminase IgA: 21 U/mL — ABNORMAL HIGH (ref 0–3)

## 2023-04-23 LAB — COMPREHENSIVE METABOLIC PANEL
ALT: 11 IU/L (ref 0–32)
AST: 16 IU/L (ref 0–40)
Albumin: 4.1 g/dL (ref 3.9–4.9)
Alkaline Phosphatase: 67 IU/L (ref 44–121)
BUN/Creatinine Ratio: 8 — ABNORMAL LOW (ref 9–23)
BUN: 6 mg/dL (ref 6–20)
Bilirubin Total: <0.2 mg/dL (ref 0.0–1.2)
CO2: 24 mmol/L (ref 20–29)
Calcium: 9 mg/dL (ref 8.7–10.2)
Chloride: 104 mmol/L (ref 96–106)
Creatinine, Ser: 0.76 mg/dL (ref 0.57–1.00)
Globulin, Total: 2 g/dL (ref 1.5–4.5)
Glucose: 97 mg/dL (ref 70–99)
Potassium: 3.9 mmol/L (ref 3.5–5.2)
Sodium: 140 mmol/L (ref 134–144)
Total Protein: 6.1 g/dL (ref 6.0–8.5)
eGFR: 104 mL/min/{1.73_m2} (ref >59)

## 2023-04-23 LAB — SEDIMENTATION RATE: Sed Rate: 3 mm/h (ref 0–32)

## 2023-04-23 LAB — C-REACTIVE PROTEIN: CRP: <1 mg/L (ref 0–10)

## 2023-05-01 ENCOUNTER — Encounter: Payer: Self-pay | Admitting: Internal Medicine

## 2023-05-01 MED ORDER — PROMETHAZINE HCL 25 MG PO TABS: 25.0000 mg | ORAL_TABLET | Freq: Three times a day (TID) | ORAL | 0 refills | Status: DC | PRN

## 2023-05-23 ENCOUNTER — Encounter: Payer: Self-pay | Admitting: *Deleted

## 2023-05-23 ENCOUNTER — Other Ambulatory Visit: Payer: Self-pay | Admitting: *Deleted

## 2023-05-23 DIAGNOSIS — R140 Abdominal distension (gaseous): Secondary | ICD-10-CM

## 2023-05-23 DIAGNOSIS — R112 Nausea with vomiting, unspecified: Secondary | ICD-10-CM

## 2023-05-27 NOTE — Telephone Encounter (Signed)
Please advise about the cough.

## 2023-05-27 NOTE — Telephone Encounter (Signed)
Please advise. Thank you

## 2023-05-28 ENCOUNTER — Other Ambulatory Visit (HOSPITAL_COMMUNITY)
Admission: RE | Admit: 2023-05-28 | Discharge: 2023-05-28 | Disposition: A | Discharge status: Home / Self Care | Payer: Medicaid Other | Source: Ambulatory Visit | Attending: Internal Medicine | Admitting: Internal Medicine

## 2023-05-28 DIAGNOSIS — R112 Nausea with vomiting, unspecified: Secondary | ICD-10-CM | POA: Diagnosis present

## 2023-05-28 DIAGNOSIS — R140 Abdominal distension (gaseous): Secondary | ICD-10-CM | POA: Diagnosis present

## 2023-05-28 LAB — PREGNANCY, URINE: Preg Test, Ur: NEGATIVE

## 2023-05-30 ENCOUNTER — Ambulatory Visit (HOSPITAL_COMMUNITY)
Admission: RE | Admit: 2023-05-30 | Discharge: 2023-05-30 | Disposition: A | Discharge status: Home / Self Care | Payer: Medicaid Other | Attending: Internal Medicine | Admitting: Internal Medicine

## 2023-05-30 ENCOUNTER — Ambulatory Visit (HOSPITAL_COMMUNITY): Payer: Medicaid Other | Admitting: Anesthesiology

## 2023-05-30 ENCOUNTER — Encounter (HOSPITAL_COMMUNITY)
Admission: RE | Disposition: A | Discharge status: Home / Self Care | Payer: Self-pay | Source: Home / Self Care | Attending: Internal Medicine

## 2023-05-30 ENCOUNTER — Other Ambulatory Visit: Payer: Self-pay

## 2023-05-30 ENCOUNTER — Encounter (HOSPITAL_COMMUNITY): Payer: Self-pay

## 2023-05-30 DIAGNOSIS — K2950 Unspecified chronic gastritis without bleeding: Secondary | ICD-10-CM | POA: Insufficient documentation

## 2023-05-30 DIAGNOSIS — F17200 Nicotine dependence, unspecified, uncomplicated: Secondary | ICD-10-CM | POA: Insufficient documentation

## 2023-05-30 DIAGNOSIS — R112 Nausea with vomiting, unspecified: Secondary | ICD-10-CM | POA: Diagnosis present

## 2023-05-30 DIAGNOSIS — R769 Abnormal immunological finding in serum, unspecified: Secondary | ICD-10-CM | POA: Diagnosis not present

## 2023-05-30 HISTORY — PX: BIOPSY: SHX5522

## 2023-05-30 HISTORY — PX: ESOPHAGOGASTRODUODENOSCOPY (EGD) WITH PROPOFOL: SHX5813

## 2023-05-30 SURGERY — ESOPHAGOGASTRODUODENOSCOPY (EGD) WITH PROPOFOL
Anesthesia: General

## 2023-05-30 MED ORDER — LACTATED RINGERS IV SOLN: INTRAVENOUS | Status: DC

## 2023-05-30 MED ORDER — LIDOCAINE 2% (20 MG/ML) 5 ML SYRINGE
INTRAMUSCULAR | Status: DC | PRN
  Administered 2023-05-30: 80 mg via INTRAVENOUS

## 2023-05-30 MED ORDER — PANTOPRAZOLE SODIUM 40 MG PO TBEC
40.0000 mg | DELAYED_RELEASE_TABLET | Freq: Every day | ORAL | 11 refills | Status: DC
Start: 2023-05-30 — End: 2024-03-19

## 2023-05-30 MED ORDER — PROPOFOL 10 MG/ML IV BOLUS
INTRAVENOUS | Status: DC | PRN
  Administered 2023-05-30: 70 mg via INTRAVENOUS
  Administered 2023-05-30: 100 mg via INTRAVENOUS

## 2023-05-30 MED ORDER — PROMETHAZINE HCL 25 MG PO TABS: 25.0000 mg | ORAL_TABLET | Freq: Four times a day (QID) | ORAL | 2 refills | Status: DC | PRN

## 2023-05-30 NOTE — H&P (Signed)
Primary Care Physician:  [name], DO Primary Gastroenterologist:  Dr. Marletta Lor  Pre-Procedure History & Physical: HPI:  Roberta Baker is a 37 y.o. 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 Authorizing Provider  ALPRAZolam Prudy Feeler) 0.5 MG tablet Take 1 tablet (0.5 mg total) by mouth 2 (two) times daily as needed for anxiety. 02/19/23  Yes Cook, Jayce G, DO  DULoxetine (CYMBALTA) 30 MG capsule Take 3 capsules (90 mg total) by mouth daily. 04/03/23  Yes Cook, Jayce G, DO  promethazine (PHENERGAN) 25 MG tablet Take 1 tablet (25 mg total) by mouth every 8 (eight) hours as needed for nausea or vomiting. 05/01/23  Yes Victormanuel Mclure, Hennie Duos, DO  tetrahydrozoline-zinc (VISINE-AC) 0.05-0.25 % ophthalmic solution Place 2 drops into both eyes 3 (three) times daily as needed (itchy eyes).   Yes [provider]  ondansetron (ZOFRAN) 8 MG tablet Take 1 tablet (8 mg total) by mouth every 8 (eight) hours as needed for nausea or vomiting. Patient taking differently: Take 8 mg by mouth every 8  (eight) hours as needed for nausea or vomiting. Pt not taking 04/05/23   Everlene Other G, DO    Allergies as of 05/01/2023 - Review Complete 04/18/2023  Allergen Reaction Noted   Buspar [buspirone] Other (See Comments) 11/08/2021   Sulfa antibiotics Nausea And Vomiting 09/28/2012    Family History  Problem Relation Age of Onset   Cancer Maternal Grandmother        breast cancer   Heart attack Maternal Grandfather    Mental illness Mother    Seizures Mother    Cancer Maternal Uncle     Social History   Socioeconomic History   Marital status: Single    Spouse name: Not on file   Number of children: 1   Years of education: Not on file   Highest education level: Some college, no degree  Occupational History   Not on file  Tobacco Use   Smoking status: Every Day    Current packs/day: 0.75    Average packs/day: 0.8 packs/day for 0.5 years (0.4 ttl pk-yrs)    Types: Cigarettes   Smokeless tobacco: Never  Vaping Use   Vaping status: Never Used  Substance and Sexual Activity   Alcohol use: No   Drug use: Not Currently    Types: Marijuana    Comment: occasional   Sexual activity: Not Currently    Birth control/protection: Surgical    Comment: tubal  Other Topics Concern  Not on file  Social History Narrative   Not on file   Social Determinants of Health   Financial Resource Strain: Low Risk  (02/08/2023)   Overall Financial Resource Strain (CARDIA)    Difficulty of Paying Living Expenses: Not hard at all  Food Insecurity: No Food Insecurity (02/08/2023)   Hunger Vital Sign    Worried About Running Out of Food in the Last Year: Never true    Ran Out of Food in the Last Year: Never true  Transportation Needs: No Transportation Needs (02/08/2023)   PRAPARE - Administrator, Civil Service (Medical): No    Lack of Transportation (Non-Medical): No  Physical Activity: Insufficiently Active (02/08/2023)   Exercise Vital Sign    Days of Exercise per Week: 4 days     Minutes of Exercise per Session: 30 min  Stress: Stress Concern Present (02/08/2023)   [employer] of Occupational Health - Occupational Stress Questionnaire    Feeling of Stress : Rather much  Social Connections: Moderately Isolated (06/02/2021)   Social Connection and Isolation Panel [NHANES]    Frequency of Communication with Friends and Family: More than three times a week    Frequency of Social Gatherings with Friends and Family: More than three times a week    Attends Religious Services: Never    Database administrator or Organizations: No    Attends Banker Meetings: Never    Marital Status: Living with partner  Intimate Partner Violence: Not At Risk (06/02/2021)   Humiliation, Afraid, Rape, and Kick questionnaire    Fear of Current or Ex-Partner: No    Emotionally Abused: No    Physically Abused: No    Sexually Abused: No    Review of Systems: General: Negative for fever, chills, fatigue, weakness. Eyes: Negative for vision changes.  ENT: Negative for hoarseness, difficulty swallowing , nasal congestion. CV: Negative for chest pain, angina, palpitations, dyspnea on exertion, peripheral edema.  Respiratory: Negative for dyspnea at rest, dyspnea on exertion, cough, sputum, wheezing.  GI: See history of present illness. GU:  Negative for dysuria, hematuria, urinary incontinence, urinary frequency, nocturnal urination.  MS: Negative for joint pain, low back pain.  Derm: Negative for rash or itching.  Neuro: Negative for weakness, abnormal sensation, seizure, frequent headaches, memory loss, confusion.  Psych: Negative for anxiety, depression Endo: Negative for unusual weight change.  Heme: Negative for bruising or bleeding. Allergy: Negative for rash or hives.  Physical Exam: Vital signs in last 24 hours: Temp:  [98.7 F (37.1 C)] 98.7 F (37.1 C) (09/26 1034) Pulse Rate:  [76] 76 (09/26 1034) Resp:  [16] 16 (09/26 1034) BP: (101)/(55) 101/55 (09/26  1034) SpO2:  [98 %] 98 % (09/26 1034) Weight:  [54.9 kg] 54.9 kg (09/26 1034)   General:   Alert,  Well-developed, well-nourished, pleasant and cooperative in NAD Head:  Normocephalic and atraumatic. Eyes:  Sclera clear, no icterus.   Conjunctiva pink. Ears:  Normal auditory acuity. Nose:  No deformity, discharge,  or lesions. Msk:  Symmetrical without gross deformities. Normal posture. Extremities:  Without clubbing or edema. Neurologic:  Alert and  oriented x4;  grossly normal neurologically. Skin:  Intact without significant lesions or rashes. Psych:  Alert and cooperative. Normal mood and affect.   Impression/Plan: [facility] Fredrich Holquin is here for an EGD to be performed for nausea and vomiting, positive celiac antibodies   Risks, benefits, limitations, imponderables and alternatives regarding procedure have been reviewed with the patient. Questions  have been answered. All parties agreeable.

## 2023-05-30 NOTE — Anesthesia Preprocedure Evaluation (Signed)
Anesthesia Evaluation  Patient identified by MRN, date of birth, ID band Patient awake    Reviewed: Allergy & Precautions, H&P , NPO status , Patient's Chart, lab work & pertinent test results, reviewed documented beta blocker date and time   Airway Mallampati: II  TM Distance: >3 FB Neck ROM: full    Dental no notable dental hx. (+) Loose, Missing, Poor Dentition   Pulmonary neg pulmonary ROS, Current Smoker and Patient abstained from smoking.   Pulmonary exam normal breath sounds clear to auscultation       Cardiovascular Exercise Tolerance: Good negative cardio ROS  Rhythm:regular Rate:Normal     Neuro/Psych  Headaches, Seizures -,  PSYCHIATRIC DISORDERS Anxiety Depression    negative neurological ROS  negative psych ROS   GI/Hepatic negative GI ROS, Neg liver ROS,,,  Endo/Other  negative endocrine ROS    Renal/GU Renal diseasenegative Renal ROS  negative genitourinary   Musculoskeletal   Abdominal   Peds  Hematology negative hematology ROS (+)   Anesthesia Other Findings   Reproductive/Obstetrics negative OB ROS                             Anesthesia Physical Anesthesia Plan  ASA: 2  Anesthesia Plan: General   Post-op Pain Management:    Induction:   PONV Risk Score and Plan:   Airway Management Planned:   Additional Equipment:   Intra-op Plan:   Post-operative Plan:   Informed Consent: I have reviewed the patients History and Physical, chart, labs and discussed the procedure including the risks, benefits and alternatives for the proposed anesthesia with the patient or authorized representative who has indicated his/her understanding and acceptance.     Dental Advisory Given  Plan Discussed with: CRNA  Anesthesia Plan Comments:        Anesthesia Quick Evaluation

## 2023-05-30 NOTE — Transfer of Care (Signed)
Immediate Anesthesia Transfer of Care Note  Patient: Roberta Baker  Procedure(s) Performed: ESOPHAGOGASTRODUODENOSCOPY (EGD) WITH PROPOFOL BIOPSY  Patient Location: Endoscopy Unit  Anesthesia Type:General  Level of Consciousness: awake, alert , and oriented  Airway & Oxygen Therapy: Patient Spontanous Breathing  Post-op Assessment: Report given to RN and Post -op Vital signs reviewed and stable  Post vital signs: Reviewed and stable  Last Vitals:  Vitals Value Taken Time  BP    Temp    Pulse    Resp    SpO2      Last Pain:  Vitals:   05/30/23 1113  TempSrc:   PainSc: 0-No pain      Patients Stated Pain Goal: 8 (05/30/23 1034)  Complications: No notable events documented.

## 2023-05-30 NOTE — Op Note (Signed)
[facility] Patient Name: Roberta Baker Procedure Date: 05/30/2023 10:55 AM MRN: [id number] Date of Birth: 1985-10-18 Attending MD: Hennie Duos. Marletta Lor , [place], [id number] CSN: [id number] Age: 37 Admit Type: Outpatient Procedure:                Upper GI endoscopy Indications:              Nausea with vomiting, Positive celiac serologies Providers:                Hennie Duos. Marletta Lor, DO, Sheran Fava, Zena Amos Referring MD:             Hennie Duos. Marletta Lor, DO Medicines:                See the Anesthesia note for documentation of the                            administered medications Complications:            No immediate complications. Estimated Blood Loss:     Estimated blood loss was minimal. Procedure:                Pre-Anesthesia Assessment:                           - The anesthesia plan was to use monitored                            anesthesia care (MAC).                           After obtaining informed consent, the endoscope was                            passed under direct vision. Throughout the                            procedure, the patient's blood pressure, pulse, and                            oxygen saturations were monitored continuously. The                            [id number]-H190 ([id number]) scope was introduced through the                            mouth, and advanced to the second part of duodenum.                            The upper GI endoscopy was accomplished without                            difficulty. The patient tolerated the procedure  well. Scope In: 11:17:56 AM Scope Out: 11:22:39 AM Total Procedure Duration: 0 hours 4 minutes 43 seconds  Findings:      The Z-line was regular and was found 35 cm from the incisors.      Patchy mild inflammation characterized by erythema was found in the       gastric body and in the gastric antrum. Biopsies were taken with a cold       forceps for  Helicobacter pylori testing.      The duodenal bulb, first portion of the duodenum and second portion of       the duodenum were normal. Biopsies for histology were taken with a cold       forceps for evaluation of celiac disease. Impression:               - Z-line regular, 35 cm from the incisors.                           - Gastritis. Biopsied.                           - Normal duodenal bulb, first portion of the                            duodenum and second portion of the duodenum.                            Biopsied. Moderate Sedation:      Per Anesthesia Care Recommendation:           - Patient has a contact number available for                            emergencies. The signs and symptoms of potential                            delayed complications were discussed with the                            patient. Return to normal activities tomorrow.                            Written discharge instructions were provided to the                            patient.                           - Resume previous diet.                           - Continue present medications.                           - Await pathology results.                           - Use Protonix (pantoprazole) 40 mg PO daily.                           -  Use Phenergan (promethazine) 25 mg PO TID PRN.                           - Return to GI office in 8 weeks. Procedure Code(s):        --- Professional ---                           [phone], Esophagogastroduodenoscopy, flexible,                            transoral; with biopsy, single or multiple Diagnosis Code(s):        --- Professional ---                           K29.70, Gastritis, unspecified, without bleeding                           R11.2, Nausea with vomiting, unspecified CPT copyright 2022 American Medical Association. All rights reserved. The codes documented in this report are preliminary and upon coder review may  be revised to meet current compliance  requirements. Hennie Duos. Marletta Lor, DO Hennie Duos. Marletta Lor, DO 05/30/2023 11:25:26 AM This report has been signed electronically. Number of Addenda: 0

## 2023-05-30 NOTE — Discharge Instructions (Addendum)
EGD Discharge instructions Please read the instructions outlined below and refer to this sheet in the next few weeks. These discharge instructions provide you with general information on caring for yourself after you leave the hospital. Your doctor may also give you specific instructions. While your treatment has been planned according to the most current medical practices available, unavoidable complications occasionally occur. If you have any problems or questions after discharge, please call your doctor. ACTIVITY You may resume your regular activity but move at a slower pace for the next 24 hours.  Take frequent rest periods for the next 24 hours.  Walking will help expel (get rid of) the air and reduce the bloated feeling in your abdomen.  No driving for 24 hours (because of the anesthesia (medicine) used during the test).  You may shower.  Do not sign any important legal documents or operate any machinery for 24 hours (because of the anesthesia used during the test).  NUTRITION Drink plenty of fluids.  You may resume your normal diet.  Begin with a light meal and progress to your normal diet.  Avoid alcoholic beverages for 24 hours or as instructed by your caregiver.  MEDICATIONS You may resume your normal medications unless your caregiver tells you otherwise.  WHAT YOU CAN EXPECT TODAY You may experience abdominal discomfort such as a feeling of fullness or "gas" pains.  FOLLOW-UP Your doctor will discuss the results of your test with you.  SEEK IMMEDIATE MEDICAL ATTENTION IF ANY OF THE FOLLOWING OCCUR: Excessive nausea (feeling sick to your stomach) and/or vomiting.  Severe abdominal pain and distention (swelling).  Trouble swallowing.  Temperature over 101 F (37.8 C).  Rectal bleeding or vomiting of blood.    Your EGD revealed mild amount inflammation in your stomach.  I took biopsies of this to rule out infection with a bacteria called H. pylori.  Also took samples of your  small bowel to test for celiac disease.  Esophagus appeared normal. We will call with these results  I am going to start you on a new medication called pantoprazole 40 mg daily.  I have refilled your Phenergan, limit yourself to 4 tablets daily.  Limit marijuana use.  Follow-up in GI office in 6 weeks.  I hope you have a great rest of your week!  Hennie Duos. Marletta Lor, D.O. Gastroenterology and Hepatology [facility]

## 2023-05-31 LAB — SURGICAL PATHOLOGY

## 2023-06-01 NOTE — Anesthesia Postprocedure Evaluation (Signed)
Anesthesia Post Note  Patient: Roberta Baker  Procedure(s) Performed: ESOPHAGOGASTRODUODENOSCOPY (EGD) WITH PROPOFOL BIOPSY  Patient location during evaluation: Phase II Anesthesia Type: General Level of consciousness: awake Pain management: pain level controlled Vital Signs Assessment: post-procedure vital signs reviewed and stable Respiratory status: spontaneous breathing and respiratory function stable Cardiovascular status: blood pressure returned to baseline and stable Postop Assessment: no headache and no apparent nausea or vomiting Anesthetic complications: no Comments: Late entry   No notable events documented.   Last Vitals:  Vitals:   05/30/23 1131 05/30/23 1136  BP: (!) 89/75 (!) 99/52  Pulse:    Resp:    Temp:    SpO2:      Last Pain:  Vitals:   05/30/23 1127  TempSrc: Oral  PainSc: 0-No pain                 Windell Norfolk

## 2023-06-03 ENCOUNTER — Encounter: Payer: Self-pay | Admitting: Family Medicine

## 2023-06-05 ENCOUNTER — Other Ambulatory Visit: Payer: Self-pay | Admitting: Family Medicine

## 2023-06-05 DIAGNOSIS — F418 Other specified anxiety disorders: Secondary | ICD-10-CM

## 2023-06-05 MED ORDER — ALPRAZOLAM 0.5 MG PO TABS: 0.5000 mg | ORAL_TABLET | Freq: Two times a day (BID) | ORAL | 3 refills | Status: DC | PRN

## 2023-06-07 ENCOUNTER — Encounter (HOSPITAL_COMMUNITY): Payer: Self-pay | Admitting: Internal Medicine

## 2023-07-19 NOTE — Progress Notes (Deleted)
GI Office Note    Referring Provider: [name], DO Primary Care Physician:  [name], DO Primary Gastroenterologist: Hennie Duos. Marletta Lor, DO  Date:  07/19/2023  ID:  Roberta Baker, DOB 12/28/1985, MRN [id number]   Chief Complaint   No chief complaint on file.  History of Present Illness  Roberta Baker is a 37 y.o. female with a history of *** presenting today with complaint of     Last office visit 04/18/23.***.  EGD 05/30/23: -***  Today:    Wt Readings from Last 3 Encounters:  05/30/23 121 lb (54.9 kg)  04/18/23 122 lb (55.3 kg)  04/03/23 124 lb 9.6 oz (56.5 kg)    Current Outpatient Medications  Medication Sig Dispense Refill   ALPRAZolam (XANAX) 0.5 MG tablet Take 1 tablet (0.5 mg total) by mouth 2 (two) times daily as needed for anxiety. 60 tablet 3   DULoxetine (CYMBALTA) 30 MG capsule Take 3 capsules (90 mg total) by mouth daily. 270 capsule 1   pantoprazole (PROTONIX) 40 MG tablet Take 1 tablet (40 mg total) by mouth daily. 30 tablet 11   promethazine (PHENERGAN) 25 MG tablet Take 1 tablet (25 mg total) by mouth every 6 (six) hours as needed for nausea or vomiting. 120 tablet 2   tetrahydrozoline-zinc (VISINE-AC) 0.05-0.25 % ophthalmic solution Place 2 drops into both eyes 3 (three) times daily as needed (itchy eyes).     No current facility-administered medications for this visit.    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    Past Surgical History:  Procedure Laterality Date   BIOPSY  05/30/2023   Procedure: BIOPSY;  Surgeon: Lanelle Bal, DO;  Location: [facility];  Service: Endoscopy;;   BREAST ENHANCEMENT SURGERY     BREAST SURGERY     ESOPHAGOGASTRODUODENOSCOPY  (EGD) WITH PROPOFOL N/A 05/30/2023   Procedure: ESOPHAGOGASTRODUODENOSCOPY (EGD) WITH PROPOFOL;  Surgeon: Lanelle Bal, DO;  Location: [facility];  Service: Endoscopy;  Laterality: N/A;  12:00pm, asa 2   LAPAROSCOPIC BILATERAL SALPINGECTOMY Bilateral 01/16/2022   Procedure: LAPAROSCOPIC BILATERAL SALPINGECTOMY;  Surgeon: Myna Hidalgo, DO;  Location: [facility];  Service: Gynecology;  Laterality: Bilateral;   LEEP N/A 01/16/2022   Procedure: LOOP ELECTROSURGICAL EXCISION PROCEDURE (LEEP);  Surgeon: Myna Hidalgo, DO;  Location: [facility];  Service: Gynecology;  Laterality: N/A;    Family History  Problem Relation Age of Onset   Cancer Maternal Grandmother        breast cancer   Heart attack Maternal Grandfather    Mental illness Mother    Seizures Mother    Cancer Maternal Uncle     Allergies as of 07/23/2023 - Review Complete 05/30/2023  Allergen Reaction Noted   Buspar [buspirone] Other (See Comments) 11/08/2021   Sulfa antibiotics Nausea And Vomiting 09/28/2012    Social History   Socioeconomic History   Marital status: Single    Spouse name: Not on file   Number of children: 1   Years of education: Not on file   Highest education level: Some college, no degree  Occupational History   Not on file  Tobacco  Use   Smoking status: Every Day    Current packs/day: 0.75    Average packs/day: 0.8 packs/day for 0.5 years (0.4 ttl pk-yrs)    Types: Cigarettes   Smokeless tobacco: Never  Vaping Use   Vaping status: Never Used  Substance and Sexual Activity   Alcohol use: No   Drug use: Not Currently    Types: Marijuana    Comment: occasional   Sexual activity: Not Currently    Birth control/protection: Surgical    Comment: tubal  Other Topics Concern   Not on file  Social History Narrative   Not on file   Social Determinants of Health   Financial Resource Strain: Low Risk  (02/08/2023)   Overall Financial Resource Strain (CARDIA)    Difficulty of Paying Living  Expenses: Not hard at all  Food Insecurity: No Food Insecurity (02/08/2023)   Hunger Vital Sign    Worried About Running Out of Food in the Last Year: Never true    Ran Out of Food in the Last Year: Never true  Transportation Needs: No Transportation Needs (02/08/2023)   PRAPARE - Administrator, Civil Service (Medical): No    Lack of Transportation (Non-Medical): No  Physical Activity: Insufficiently Active (02/08/2023)   Exercise Vital Sign    Days of Exercise per Week: 4 days    Minutes of Exercise per Session: 30 min  Stress: Stress Concern Present (02/08/2023)   [employer] of Occupational Health - Occupational Stress Questionnaire    Feeling of Stress : Rather much  Social Connections: Moderately Isolated (06/02/2021)   Social Connection and Isolation Panel [NHANES]    Frequency of Communication with Friends and Family: More than three times a week    Frequency of Social Gatherings with Friends and Family: More than three times a week    Attends Religious Services: Never    Database administrator or Organizations: No    Attends Banker Meetings: Never    Marital Status: Living with partner     Review of Systems   Gen: Denies fever, chills, anorexia. Denies fatigue, weakness, weight loss.  CV: Denies chest pain, palpitations, syncope, peripheral edema, and claudication. Resp: Denies dyspnea at rest, cough, wheezing, coughing up blood, and pleurisy. GI: See HPI Derm: Denies rash, itching, dry skin Psych: Denies depression, anxiety, memory loss, confusion. No homicidal or suicidal ideation.  Heme: Denies bruising, bleeding, and enlarged lymph nodes.  Physical Exam   There were no vitals taken for this visit.  General:   Alert and oriented. No distress noted. Pleasant and cooperative.  Head:  Normocephalic and atraumatic. Eyes:  Conjuctiva clear without scleral icterus. Mouth:  Oral mucosa pink and moist. Good dentition. No lesions. Lungs:  Clear to  auscultation bilaterally. No wheezes, rales, or rhonchi. No distress.  Heart:  S1, S2 present without murmurs appreciated.  Abdomen:  +BS, soft, non-tender and non-distended. No rebound or guarding. No HSM or masses noted. Rectal: *** Msk:  Symmetrical without gross deformities. Normal posture. Extremities:  Without edema. Neurologic:  Alert and  oriented x4 Psych:  Alert and cooperative. Normal mood and affect.  Assessment  Roberta Baker is a 37 y.o. female with a history of *** presenting today with    PLAN   ***     Brooke Bonito, MSN, FNP-BC, AGACNP-BC [facility]

## 2023-07-23 ENCOUNTER — Ambulatory Visit: Payer: Medicaid Other | Admitting: Gastroenterology

## 2023-08-12 NOTE — Progress Notes (Unsigned)
GI Office Note    Referring Provider: [name], DO Primary Care Physician:  [name], DO Primary Gastroenterologist: Hennie Duos. Marletta Lor, DO  Date:  08/12/2023  ID:  Roberta Baker, DOB Nov 10, 1985, MRN [id number]   Chief Complaint   No chief complaint on file.  History of Present Illness  Roberta Baker is a 37 y.o. female with a history of *** presenting today with complaint of   Initial office visit 04/18/2023.  Presented for evaluation of chronic nausea.  She states this started after having her first child 6 years prior.  Taking Phenergan daily which helps keep symptoms at bay.  She felt as though her Cymbalta may be contributing slightly although she was having nausea prior to this.  She did note marijuana use but states is very seldom(1-2 times per month).  Denied any epigastric pain chest pain, reflux, indigestion.  Reportedly bowels moving well without constipation or diarrhea.  Does have some occasional abdominal bloating that is mild and intermittent.  Advise schedule EGD for further evaluation.  TSH noted to be within normal limits check labs including celiac, CRP/ESR.  Celiac panel strongly positive.  CRP and ESR within normal limits.  EGD 05/30/2023: -Gastritis s/p biopsy -Normal duodenum s/p biopsy -Start pantoprazole 40 mg once daily -Phenergan 25 mg p.o. 3 times a day as needed -Pathology without evidence of celiac.  Gastric biopsies with chronic inactive gastritis, negative H. pylori.  Today:    Wt Readings from Last 3 Encounters:  05/30/23 121 lb (54.9 kg)  04/18/23 122 lb (55.3 kg)  04/03/23 124 lb 9.6 oz (56.5 kg)    Current Outpatient Medications  Medication Sig Dispense Refill   ALPRAZolam (XANAX) 0.5 MG tablet Take 1 tablet (0.5 mg total) by mouth 2 (two) times daily as needed for anxiety. 60 tablet 3   DULoxetine (CYMBALTA) 30 MG capsule Take 3 capsules (90 mg total) by mouth daily. 270 capsule 1   pantoprazole (PROTONIX) 40 MG  tablet Take 1 tablet (40 mg total) by mouth daily. 30 tablet 11   promethazine (PHENERGAN) 25 MG tablet Take 1 tablet (25 mg total) by mouth every 6 (six) hours as needed for nausea or vomiting. 120 tablet 2   tetrahydrozoline-zinc (VISINE-AC) 0.05-0.25 % ophthalmic solution Place 2 drops into both eyes 3 (three) times daily as needed (itchy eyes).     No current facility-administered medications for this visit.    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    Past Surgical History:  Procedure Laterality Date   BIOPSY  05/30/2023   Procedure: BIOPSY;  Surgeon: Lanelle Bal, DO;  Location: [facility];  Service: Endoscopy;;   BREAST ENHANCEMENT SURGERY     BREAST SURGERY     ESOPHAGOGASTRODUODENOSCOPY (EGD) WITH PROPOFOL N/A 05/30/2023   Procedure: ESOPHAGOGASTRODUODENOSCOPY (EGD) WITH PROPOFOL;  Surgeon: Lanelle Bal, DO;  Location: [facility];  Service: Endoscopy;  Laterality: N/A;  12:00pm, asa 2   LAPAROSCOPIC BILATERAL SALPINGECTOMY Bilateral 01/16/2022   Procedure: LAPAROSCOPIC BILATERAL SALPINGECTOMY;  Surgeon: Myna Hidalgo, DO;  Location: [facility];  Service: Gynecology;  Laterality: Bilateral;   LEEP  N/A 01/16/2022   Procedure: LOOP ELECTROSURGICAL EXCISION PROCEDURE (LEEP);  Surgeon: Myna Hidalgo, DO;  Location: [facility];  Service: Gynecology;  Laterality: N/A;    Family History  Problem Relation Age of Onset   Cancer Maternal Grandmother        breast cancer   Heart attack Maternal Grandfather    Mental illness Mother    Seizures Mother    Cancer Maternal Uncle     Allergies as of 08/13/2023 - Review Complete 05/30/2023  Allergen Reaction Noted   Buspar [buspirone] Other (See Comments) 11/08/2021    Sulfa antibiotics Nausea And Vomiting 09/28/2012    Social History   Socioeconomic History   Marital status: Single    Spouse name: Not on file   Number of children: 1   Years of education: Not on file   Highest education level: Some college, no degree  Occupational History   Not on file  Tobacco Use   Smoking status: Every Day    Current packs/day: 0.75    Average packs/day: 0.8 packs/day for 0.5 years (0.4 ttl pk-yrs)    Types: Cigarettes   Smokeless tobacco: Never  Vaping Use   Vaping status: Never Used  Substance and Sexual Activity   Alcohol use: No   Drug use: Not Currently    Types: Marijuana    Comment: occasional   Sexual activity: Not Currently    Birth control/protection: Surgical    Comment: tubal  Other Topics Concern   Not on file  Social History Narrative   Not on file   Social Determinants of Health   Financial Resource Strain: Low Risk  (02/08/2023)   Overall Financial Resource Strain (CARDIA)    Difficulty of Paying Living Expenses: Not hard at all  Food Insecurity: No Food Insecurity (02/08/2023)   Hunger Vital Sign    Worried About Running Out of Food in the Last Year: Never true    Ran Out of Food in the Last Year: Never true  Transportation Needs: No Transportation Needs (02/08/2023)   PRAPARE - Administrator, Civil Service (Medical): No    Lack of Transportation (Non-Medical): No  Physical Activity: Insufficiently Active (02/08/2023)   Exercise Vital Sign    Days of Exercise per Week: 4 days    Minutes of Exercise per Session: 30 min  Stress: Stress Concern Present (02/08/2023)   [employer] of Occupational Health - Occupational Stress Questionnaire    Feeling of Stress : Rather much  Social Connections: Moderately Isolated (06/02/2021)   Social Connection and Isolation Panel [NHANES]    Frequency of Communication with Friends and Family: More than three times a week    Frequency of Social Gatherings with Friends and Family: More  than three times a week    Attends Religious Services: Never    Database administrator or Organizations: No    Attends Banker Meetings: Never    Marital Status: Living with partner     Review of Systems   Gen: Denies fever, chills, anorexia. Denies fatigue, weakness, weight loss.  CV: Denies chest pain, palpitations, syncope, peripheral edema, and claudication. Resp: Denies dyspnea at rest, cough, wheezing, coughing up blood, and pleurisy. GI: See HPI Derm: Denies rash, itching, dry skin Psych: Denies depression, anxiety, memory loss, confusion. No homicidal or suicidal ideation.  Heme: Denies bruising, bleeding, and enlarged lymph nodes.  Physical Exam   There were no vitals taken for this visit.  General:   Alert and oriented.  No distress noted. Pleasant and cooperative.  Head:  Normocephalic and atraumatic. Eyes:  Conjuctiva clear without scleral icterus. Mouth:  Oral mucosa pink and moist. Good dentition. No lesions. Lungs:  Clear to auscultation bilaterally. No wheezes, rales, or rhonchi. No distress.  Heart:  S1, S2 present without murmurs appreciated.  Abdomen:  +BS, soft, non-tender and non-distended. No rebound or guarding. No HSM or masses noted. Rectal: *** Msk:  Symmetrical without gross deformities. Normal posture. Extremities:  Without edema. Neurologic:  Alert and  oriented x4 Psych:  Alert and cooperative. Normal mood and affect.  Assessment  Roberta Baker is a 37 y.o. female with a history of *** presenting today with   Positive celiac serology: Positive celiac panel on blood work.  Recent EGD with duodenal biopsies without any evidence of gluten enteropathy.  Despite inconclusive serology and pathology findings her chronic nausea could be due to gluten sensitivity.  I have recommended for her to follow a strict gluten-free diet and monitor for improvement of symptoms ***.  Chronic nausea:  Gastris: Chronic inactive gastritis noted on  recent EGD.  Currently on pantoprazole 40 mg once daily, ***.  PLAN   *** Trial of strict gluten-free diet. Reglan? Vs phenergan Continue pantoprazole 40 mg once daily    Brooke Bonito, MSN, FNP-BC, AGACNP-BC [facility]

## 2023-08-13 ENCOUNTER — Encounter: Payer: Self-pay | Admitting: Gastroenterology

## 2023-08-13 ENCOUNTER — Ambulatory Visit (INDEPENDENT_AMBULATORY_CARE_PROVIDER_SITE_OTHER): Payer: Medicaid Other | Admitting: Gastroenterology

## 2023-08-13 VITALS — BP 99/70 | HR 99 | Temp 98.6°F | Ht 60.0 in | Wt 119.7 lb

## 2023-08-13 DIAGNOSIS — R140 Abdominal distension (gaseous): Secondary | ICD-10-CM | POA: Diagnosis not present

## 2023-08-13 DIAGNOSIS — R197 Diarrhea, unspecified: Secondary | ICD-10-CM

## 2023-08-13 DIAGNOSIS — K2950 Unspecified chronic gastritis without bleeding: Secondary | ICD-10-CM

## 2023-08-13 DIAGNOSIS — K2970 Gastritis, unspecified, without bleeding: Secondary | ICD-10-CM

## 2023-08-13 DIAGNOSIS — R110 Nausea: Secondary | ICD-10-CM | POA: Diagnosis not present

## 2023-08-13 DIAGNOSIS — R112 Nausea with vomiting, unspecified: Secondary | ICD-10-CM

## 2023-08-13 DIAGNOSIS — R768 Other specified abnormal immunological findings in serum: Secondary | ICD-10-CM | POA: Diagnosis not present

## 2023-08-13 DIAGNOSIS — R7689 Other specified abnormal immunological findings in serum: Secondary | ICD-10-CM

## 2023-08-13 MED ORDER — PROMETHAZINE HCL 25 MG PO TABS: 25.0000 mg | ORAL_TABLET | Freq: Four times a day (QID) | ORAL | 1 refills | Status: DC | PRN

## 2023-08-13 NOTE — Patient Instructions (Addendum)
Trial gluten free diet best you can and we will recheck Celiac levels in about 8 weeks.  If no improvement in nausea or celiac levels despite avoidance of gluten then we can consider further evaluation with gastric emptying study and/or HIDA scan to further evaluate your stomach and gallbladder function.   I have provided a handout about gluten-free diet for you today.  In regards to the fruits that this is to avoid this is more processed fruits and not whole fruits from the fresh produce section.  Continue pantoprazole 40 mg once daily. We will continue phenergan every 6 hours as needed for now. No more than 4 tablets daily.  I will reach out to Dr. Adriana Simas about potentially switching you to a different antidepressant that can be known to help you with chronic nausea.  Follow up in 3 months.  I hope you have a wonderful Christmas and a happy new year!  It was a pleasure to see you today. I want to create trusting relationships with patients. If you receive a survey regarding your visit,  I greatly appreciate you taking time to fill this out on paper or through your MyChart. I value your feedback.  Brooke Bonito, MSN, FNP-BC, AGACNP-BC [facility]

## 2023-08-27 ENCOUNTER — Ambulatory Visit: Payer: Medicaid Other | Admitting: Family Medicine

## 2023-09-02 ENCOUNTER — Ambulatory Visit: Payer: Medicaid Other | Admitting: Family Medicine

## 2023-09-02 VITALS — BP 93/66 | HR 106 | Temp 98.8°F | Ht 60.0 in | Wt 119.8 lb

## 2023-09-02 DIAGNOSIS — J019 Acute sinusitis, unspecified: Secondary | ICD-10-CM

## 2023-09-02 MED ORDER — AMOXICILLIN-POT CLAVULANATE 875-125 MG PO TABS: 1.0000 | ORAL_TABLET | Freq: Two times a day (BID) | ORAL | 0 refills | Status: DC

## 2023-09-02 NOTE — Progress Notes (Signed)
   Subjective:    Patient ID: Roberta Baker, female    DOB: 1985-10-15, 37 y.o.   MRN: [id number]  Discussed the use of AI scribe software for clinical note transcription with the patient, who gave verbal consent to proceed.  History of Present Illness   The patient, with a history of anxiety and smoking, presents with a persistent cough and recent onset of nasal congestion. The symptoms began a few months ago, initially as a light cough, which has progressively worsened in intensity. The cough is daily, and the patient reports yellow sputum production. Nasal congestion started about a week ago, and the patient describes significant difficulty breathing through the nose, with a sensation of pressure in the facial area. The patient denies any facial pain. The patient also reports a sensation of blocked ears, likening it to being in an airplane. Despite these symptoms, the patient denies any fever or night sweats. The patient's anxiety has reportedly intensified due to the current symptoms. The patient continues to smoke and walks to work daily as a form of exercise.         Review of Systems     Objective:    Physical Exam   HEENT: Eardrums normal. CHEST: Lungs clear to auscultation. CARDIOVASCULAR: Heart sounds normal.     Mild sinus tenderness      Assessment & Plan:  Assessment and Plan    Sinusitis Persistent cough and recent onset of nasal congestion, facial pressure, and ear fullness. Yellow nasal discharge and sputum. No fever. Eardrums normal on examination. Likely viral infection that has progressed to bacterial sinusitis. -Start Augmentin 875mg  twice daily for 10 days. -Continue use of nasal spray and humidifier at home. -Return if symptoms worsen or if diarrhea develops from antibiotic.  Anxiety Exacerbated by current illness. History of extensive testing due to anxiety symptoms. -Continue current management strategies. -Return if anxiety worsens.       Warnings discussed follow-up if problems

## 2023-09-03 ENCOUNTER — Ambulatory Visit: Payer: Medicaid Other | Admitting: Family Medicine

## 2023-09-10 ENCOUNTER — Ambulatory Visit (INDEPENDENT_AMBULATORY_CARE_PROVIDER_SITE_OTHER): Payer: Medicaid Other | Admitting: Family Medicine

## 2023-09-10 ENCOUNTER — Encounter: Payer: Self-pay | Admitting: Family Medicine

## 2023-09-10 VITALS — BP 98/63 | HR 133 | Temp 98.4°F | Ht 60.0 in | Wt 122.4 lb

## 2023-09-10 DIAGNOSIS — F418 Other specified anxiety disorders: Secondary | ICD-10-CM

## 2023-09-10 MED ORDER — SERTRALINE HCL 50 MG PO TABS: ORAL_TABLET | ORAL | 1 refills | Status: DC

## 2023-09-10 NOTE — Patient Instructions (Signed)
 Decrease Cymbalta to 60 mg for 2 weeks, then 30 mg for 2 weeks then stop.  Start Zoloft 50 mg then increase to 100 mg after 2 weeks.   Consider referral to psychiatry.  Follow up in 6 weeks.

## 2023-09-10 NOTE — Progress Notes (Signed)
 Subjective:  Patient ID: Roberta Baker, female    DOB: 1986/05/26  Age: 38 y.o. MRN: [id number]  CC: Anxiety   HPI:  38 year old female with chronic nausea, hyperlipidemia, depression and anxiety presents for evaluation of anxiety.  Patient is being followed by GI regarding chronic nausea.  It has been thought that Cymbalta  may be contributing to her symptoms.  Patient states that her anxiety is severe and uncontrolled.  She would like to discuss changing her medication.  She is currently on Cymbalta  90 mg daily.  She has tried BuSpar  in the past.  She has also tried Klonopin  in the past.  Currently on alprazolam  as well.  Patient inquiring about increasing alprazolam .  She has a friend who is taking Zoloft  and this has worked well.  She would like to discuss this today.  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   Depression with anxiety 12/12/2016    Social Hx   Social History   Socioeconomic History   Marital status: Single    Spouse name: Not on file   Number of children: 1   Years of education: Not on file   Highest education level: Some college, no degree  Occupational History   Not on file  Tobacco Use   Smoking status: Every Day    Current packs/day: 0.75    Average packs/day: 0.8 packs/day for 0.5 years (0.4 ttl pk-yrs)    Types: Cigarettes   Smokeless tobacco: Never  Vaping Use   Vaping status: Never Used  Substance and Sexual Activity   Alcohol use: No   Drug use: Yes    Types: Marijuana    Comment: occasional   Sexual activity: Not Currently    Birth control/protection: Surgical    Comment: tubal  Other Topics Concern   Not on file  Social History Narrative   Not on file   Social Drivers of Health   Financial Resource Strain: Low  Risk  (02/08/2023)   Overall Financial Resource Strain (CARDIA)    Difficulty of Paying Living Expenses: Not hard at all  Food Insecurity: No Food Insecurity (02/08/2023)   Hunger Vital Sign    Worried About Running Out of Food in the Last Year: Never true    Ran Out of Food in the Last Year: Never true  Transportation Needs: No Transportation Needs (02/08/2023)   PRAPARE - Administrator, Civil Service (Medical): No    Lack of Transportation (Non-Medical): No  Physical Activity: Insufficiently Active (02/08/2023)   Exercise Vital Sign    Days of Exercise per Week: 4 days    Minutes of Exercise per Session: 30 min  Stress: Stress Concern Present (02/08/2023)   [employer] of Occupational Health - Occupational Stress Questionnaire    Feeling of Stress : Rather much  Social Connections: Moderately Isolated (06/02/2021)   Social Connection and Isolation Panel [NHANES]    Frequency of Communication with Friends and Family: More than three times a week    Frequency of Social Gatherings with Friends and Family: More than three times a week    Attends Religious Services: Never    Database Administrator or Organizations: No    Attends Banker Meetings: Never    Marital Status: Living with partner  Review of Systems Per HPI  Objective:  BP 98/63   Pulse (!) 133   Temp 98.4 F (36.9 C)   Ht 5' (1.524 m)   Wt 122 lb 6.4 oz (55.5 kg)   SpO2 99%   BMI 23.90 kg/m      09/10/2023    8:42 AM 09/02/2023   10:13 AM 08/13/2023    8:24 AM  BP/Weight  Systolic BP 98 93 99  Diastolic BP 63 66 70  Wt. (Lbs) 122.4 119.8 119.7  BMI 23.9 kg/m2 23.4 kg/m2 23.38 kg/m2    Physical Exam Vitals and nursing note reviewed.  Constitutional:      General: She is not in acute distress. HENT:     Head: Normocephalic and atraumatic.  Pulmonary:     Effort: Pulmonary effort is normal. No respiratory distress.  Neurological:     Mental Status: She is alert.  Psychiatric:      Comments: Anxious.  Psychomotor agitation noted.     Lab Results  Component Value Date   WBC 7.7 01/12/2022   HGB 11.4 (L) 01/12/2022   HCT 35.0 (L) 01/12/2022   PLT 404 (H) 01/12/2022   GLUCOSE 97 04/22/2023   CHOL 214 (H) 11/08/2021   TRIG 138 11/08/2021   HDL 36 (L) 11/08/2021   LDLCALC 153 (H) 11/08/2021   ALT 11 04/22/2023   AST 16 04/22/2023   NA 140 04/22/2023   K 3.9 04/22/2023   CL 104 04/22/2023   CREATININE 0.76 04/22/2023   BUN 6 04/22/2023   CO2 24 04/22/2023   TSH 0.771 11/08/2021   INR 0.98 10/25/2018     Assessment & Plan:   Problem List Items Addressed This Visit       Other   Depression with anxiety - Primary   Anxiety is severe and worsening.  Uncontrolled.  I am not increasing alprazolam .  Tapering Cymbalta  and starting Zoloft .  Referring to psychiatry.      Relevant Medications   sertraline  (ZOLOFT ) 50 MG tablet   Other Relevant Orders   Ambulatory referral to Psychiatry    Meds ordered this encounter  Medications   sertraline  (ZOLOFT ) 50 MG tablet    Sig: 50 mg daily then increase to 100 mg daily after 2 weeks.    Dispense:  90 tablet    Refill:  1    Follow-up:  Return in about 6 weeks (around 10/22/2023).  Jacqulyn Ahle DO [facility]

## 2023-09-10 NOTE — Assessment & Plan Note (Signed)
 Anxiety is severe and worsening.  Uncontrolled.  I am not increasing alprazolam.  Tapering Cymbalta and starting Zoloft.  Referring to psychiatry.

## 2023-09-11 ENCOUNTER — Telehealth: Payer: Self-pay

## 2023-09-11 NOTE — Telephone Encounter (Signed)
 I called patient to let her know I had to cancel her 10/07/2023 with Dr Adriana Simas he will be out of office. I have let pt know that she can reschedule with Toni Amend Grooms that week. If patient calls back reschedule the appt.

## 2023-09-16 ENCOUNTER — Ambulatory Visit: Payer: Medicaid Other | Admitting: Family Medicine

## 2023-09-27 ENCOUNTER — Encounter (INDEPENDENT_AMBULATORY_CARE_PROVIDER_SITE_OTHER): Payer: Self-pay

## 2023-10-07 ENCOUNTER — Ambulatory Visit: Payer: Medicaid Other | Admitting: Family Medicine

## 2023-10-13 ENCOUNTER — Other Ambulatory Visit: Payer: Self-pay | Admitting: Family Medicine

## 2023-10-13 DIAGNOSIS — F418 Other specified anxiety disorders: Secondary | ICD-10-CM

## 2023-10-13 MED ORDER — ALPRAZOLAM 1 MG PO TABS
1.0000 mg | ORAL_TABLET | Freq: Two times a day (BID) | ORAL | 1 refills | Status: DC | PRN
Start: 2023-10-13 — End: 2023-12-06

## 2023-10-14 ENCOUNTER — Ambulatory Visit: Payer: Medicaid Other | Admitting: Family Medicine

## 2023-10-17 ENCOUNTER — Ambulatory Visit: Payer: Medicaid Other | Admitting: Nurse Practitioner

## 2023-10-17 ENCOUNTER — Telehealth (HOSPITAL_COMMUNITY): Payer: Self-pay

## 2023-10-17 NOTE — Telephone Encounter (Signed)
Lvm to confirm 10/21/23 appt

## 2023-10-18 ENCOUNTER — Other Ambulatory Visit: Payer: Self-pay | Admitting: Gastroenterology

## 2023-10-18 ENCOUNTER — Encounter: Payer: Self-pay | Admitting: Internal Medicine

## 2023-10-18 ENCOUNTER — Encounter (HOSPITAL_COMMUNITY): Payer: Self-pay

## 2023-10-18 DIAGNOSIS — R112 Nausea with vomiting, unspecified: Secondary | ICD-10-CM

## 2023-10-18 MED ORDER — PROMETHAZINE HCL 25 MG PO TABS: 25.0000 mg | ORAL_TABLET | Freq: Four times a day (QID) | ORAL | 1 refills | Status: DC | PRN

## 2023-10-18 NOTE — Telephone Encounter (Signed)
Appt confirmed 10/18/23

## 2023-10-21 ENCOUNTER — Ambulatory Visit (HOSPITAL_COMMUNITY): Payer: Medicaid Other | Admitting: Registered Nurse

## 2023-10-21 ENCOUNTER — Encounter (HOSPITAL_COMMUNITY): Payer: Self-pay | Admitting: Registered Nurse

## 2023-10-21 VITALS — BP 103/69 | HR 108 | Ht 60.0 in | Wt 116.8 lb

## 2023-10-21 DIAGNOSIS — F411 Generalized anxiety disorder: Secondary | ICD-10-CM

## 2023-10-21 DIAGNOSIS — F331 Major depressive disorder, recurrent, moderate: Secondary | ICD-10-CM

## 2023-10-21 NOTE — Progress Notes (Signed)
Psychiatric Initial Adult Assessment   Patient Identification: Roberta Baker MRN:  [id number] Date of Evaluation:  10/21/2023 Referral Source: [facility] Chief Complaint:   Chief Complaint  Patient presents with   Establish Care    For medication management   Visit Diagnosis:    ICD-10-CM   1. Major depressive disorder, recurrent episode, moderate (HCC)  F33.1     2. GAD (generalized anxiety disorder)  F41.1       History of Present Illness:  Roberta Baker 38 y.o. female presents to office today to establish care for medication management.  She is seen face to face by this provider, and chart reviewed on 10/21/23.  Her psychiatric history includes depression, anxiety, and a reported history of ADHD treatment during childhood. She reports she was only referred by her PCP to talk to someone not for medication management.  She states that she is not currently taking any medication for her depression at this time related to recently being switched to Zoloft and taken off of Cymbalta.  Reports that the Zoloft caused her to have irritable mood, agitation, and extremely paranoid."  Reports she stopped taking the Zoloft 3 days ago and told her PCP that she wasn't taking it anymore."  She reports that the Cymbalta was stopped related to GI problems (nausea) "But I told them that the nausea was happening even before I started the Cymbalta."  She reports even though the Cymbalta has been stopped the nausea continues.  Reports that the nausea has been ongoing since her second pregnancy.  She states that the Cymbalta at 30 mg really worked well for her.  When asked if the 30 mg worked for her why was it increased "I guess because it was around the holidays and my doctor said lets try this and he increased.  Reports Cymbalta increased to 60 mg, then 90 mg, then 120 mg "But I was like I'm not taking this and it probably made my stomach issues worse."  She states  "When I was on the Cymbalta 30 mg and the Xanax it really worked for me.  I could fart rainbows."  She reports has tried Buspar which caused visual hallucinations, Klonopin which did not work for her, and the Zoloft which caused irritability, agitation, and worsening of paranoia.  Patient does report a history of paranoia related to the safety of her children.  "I worry about the safety of my kids even when they are at school.  I just feel like they may be kidnapped or something will happen to them.  The world is not safe."  She states that the paranoia has only been going on for 1.5 yrs after the birth of her second child.  She states she has 2 children a son 53 y/o and a daughter 75 y/o. She reports current stressor are (1) financial.  "I was a trust fund baby and when my trust fund ran out because my mother used it all.  I couldn't afford to finish school, couldn't continue dancing, or going to the Y.  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  She states she works for [employer] I love my job even though it takes time away from my kids."  She denies prior history of psychiatric hospitalization,  Outpatient psychiatric services, suicide attempt, and self-harming behaviors.  She does report speaking with a counselor at her church when a child for 3 sessions but parents were told she no longer needed it. She reports a history of being treated for ADHD as a child but did not like how the medications made her feel "Adderall is form of crack.  I didn't like how it made me feel." Patient states that she is not married but lives with her partner and their 2 children.   Reports she has one brother but is not close to him.  Reports that her brother is currently taking care of their mother "He takes really good care of her.  We are not close but if I'm needed I will be there."  Reports her primary supporter is her partner.  Patient reports occasional marijuana use.  Last use 2 weeks ago  Denies the use of alcohol, tobacco, and other illicit drugs.     Today patient expression symptoms of depression and anxiety reporting that the anxiety is significantly worse than depression.  She reports that there has been on more issues with irritability or agitation since stopping the Zoloft 3 days ago.  She denies suicidal/self-harm/homicidal ideation, and psychosis.  She endorses paranoia related to the safety of her children that has been occurring for 1.5 yrs after the birth of her second child.  AIMS, PHQ 2 & 9, C-SSRS, GAD 7, and AUDIT screenings done during visit, see scores below.  Reports eating and sleeping without difficulty. Patient then states that she was not sent to [facility] for medication management only to talk to someone (counseling) "My doctor thought that might help with my depression and anxiety.  He is still going to prescribe my medications.  Informed her PCP could see my note and would continue to recommend the taper of Alprazolam and starting a medications that would help with anxiety just as well but no tolerability or dependence.        Recommended the following medication changes: Restart Cymbalta at 30 mg daily for depression and anxiety (since she reports that this has worked best for her at this dose and doesn't feel that it has anything to do with nausea since the nausea was going on prior to starting and continues after it has been stopped)  Slow taper off of Alprazolam 1 mg (Benzodiazepine recommended for short term use and patient has been taking since 08/2022 staring with 0.5 mg and recently requesting an increase in dose.  Dose increased  to 1 mg 10/2023).  Education on long term use, tolerability, and dependence:  Benzodiazepines usually produce immediate effects and should generally be prescribed for short-term or intermittently (as needed).  Since most anxiety disorder wax and wane over time patients usually prefer benzodiazepine because they can be taken when patient feel the need to take them or they can be taken intermittently.  Benzodiazepines are rarely prescribed for long-term use because they change the way the brain functions.  Tolerance occurs when the initial dose of the benzodiazepine gradually has less effect, and a higher dose is required to get the original effect.  Over time those individuals begin to rely on drug to feel normal developing a  dependence.  Seizures and delirium tremens commonly occur with abrupt discontinuation.  Also a possibility of memory issues with long term use.      Suggested Benzodiazepine (Alprazolam/Xanax) taper over 5 weeks Week 1:  Alprazolam 1 mg twice daily.  One tablet Morning, and Bedtime (ex: 8am, 8pm)  Week 2:  Xanax 0.5 mg twice daily.   One tablet Morning and Bedtime  Week 3:  Xanax 0.25 Twice daily.  One tablet Morning and Bedtime. Week 4: Xanax 0.25 One daily.  Can chose to take  the Morning or at Bed time.   Week 5:  Xanax 0.25 One tablet every other day.  Can choose to take in the Morning or at Bedtime.     Gabapentin and/or Vistaril can help with withdrawal symptom reduction  Talk with your PCP about medication recommendations made.  If you and your PCP decide to taper off Alprazolam this provider will include a slow taper in your not so your PCP can see it.  Make your doctor is aware of what you are doing and make sure you see someone within 1-2 weeks of starting taper.    Discussed other medications that may be beneficial to depression and anxiety treatment such as Prozac for depression and anxiety also mood disorder.  Seroquel that also helps with depression, anxiety, mood,  sleep, and paranoia.  Discussed the use of marijuana use and mental health.     Referral to therapy and resources also given.  No follow up with this provider needed unless medication management is needed.    Associated Signs/Symptoms: Depression Symptoms:  insomnia, difficulty concentrating, anxiety, panic attacks, (Hypo) Manic Symptoms:  Distractibility, Anxiety Symptoms:  Excessive Worry, Panic Symptoms, Social Anxiety, Psychotic Symptoms:  Paranoia, PTSD Symptoms: NA  Past Psychiatric History: Depression, anxiety, and reported treatment of ADHD during childhood.  Previous Psychotropic Medications: Yes  Buspar, Cymbalta, Klonopin, Zoloft  Substance Abuse History in the last 12 months:  Yes.    Consequences of Substance Abuse: NA Smokes mariajuana "every now and then.  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    Past Surgical History:  Procedure Laterality Date   BIOPSY  05/30/2023   Procedure: BIOPSY;  Surgeon: Lanelle Bal, DO;  [facility]: [facility];  Service: Endoscopy;;   BREAST ENHANCEMENT SURGERY     BREAST SURGERY     ESOPHAGOGASTRODUODENOSCOPY (EGD) WITH PROPOFOL N/A 05/30/2023   Procedure: ESOPHAGOGASTRODUODENOSCOPY (EGD) WITH PROPOFOL;  Surgeon: Lanelle Bal, DO;  Location: [facility];  Service: Endoscopy;  Laterality: N/A;  12:00pm, asa 2   LAPAROSCOPIC BILATERAL SALPINGECTOMY Bilateral 01/16/2022   Procedure: LAPAROSCOPIC BILATERAL SALPINGECTOMY;  Surgeon: Myna Hidalgo, DO;  Location: [facility];  Service: Gynecology;  Laterality: Bilateral;   LEEP N/A 01/16/2022   Procedure: LOOP ELECTROSURGICAL EXCISION PROCEDURE (LEEP);  Surgeon: Myna Hidalgo, DO;  Location: [facility];  Service: Gynecology;  Laterality: N/A;   Family Psychiatric History: Denies family psychiatric history  Family History:  Family History  Problem Relation Age of Onset   Cancer Maternal Grandmother  breast cancer   Heart attack Maternal Grandfather    Mental illness Mother    Seizures Mother    Cancer Maternal Uncle    Social History:   Social History   Socioeconomic History   Marital status: Single    Spouse name: Not on file   Number of children: 1   Years of education: Not on file   Highest education level: Some college, no degree  Occupational History   Not on file  Tobacco Use   Smoking status: Every Day    Current packs/day: 0.75    Average packs/day: 0.8 packs/day for 0.5 years (0.4 ttl pk-yrs)    Types: Cigarettes   Smokeless tobacco: Never  Vaping Use   Vaping status: Never Used  Substance and Sexual Activity   Alcohol use: No   Drug use: Yes    Types: Marijuana    Comment: occasional   Sexual activity: Not Currently    Birth control/protection: Surgical    Comment: tubal  Other Topics Concern   Not on file  Social History Narrative   Not on file   Social Drivers of Health   Financial Resource Strain: Low Risk  (02/08/2023)   Overall Financial Resource Strain (CARDIA)    Difficulty of Paying Living Expenses: Not hard at all  Food Insecurity: No Food Insecurity (02/08/2023)   Hunger Vital Sign    Worried About Running Out of Food in the Last Year: Never true    Ran Out of Food in the Last Year: Never true  Transportation Needs: No Transportation Needs (02/08/2023)   PRAPARE - Administrator, Civil Service (Medical): No    Lack of Transportation (Non-Medical): No  Physical Activity: Insufficiently Active (02/08/2023)   Exercise Vital Sign    Days of Exercise per Week: 4 days    Minutes of Exercise per Session: 30 min  Stress: Stress Concern Present (02/08/2023)   [employer] of Occupational Health -  Occupational Stress Questionnaire    Feeling of Stress : Rather much  Social Connections: Moderately Isolated (06/02/2021)   Social Connection and Isolation Panel [NHANES]    Frequency of Communication with Friends and Family: More than three times a week    Frequency of Social Gatherings with Friends and Family: More than three times a week    Attends Religious Services: Never    Database administrator or Organizations: No    Attends Banker Meetings: Never    Marital Status: Living with partner    Allergies:   Allergies  Allergen Reactions   Buspar [Buspirone] Other (See Comments)    Caused nightmares/hallucinations   Sulfa Antibiotics Nausea And Vomiting    Metabolic Disorder Labs: No results found for: "HGBA1C", "MPG" No results found for: "PROLACTIN" Lab Results  Component Value Date   CHOL 214 (H) 11/08/2021   TRIG 138 11/08/2021   HDL 36 (L) 11/08/2021   CHOLHDL 5.9 (H) 11/08/2021   LDLCALC 153 (H) 11/08/2021   Lab Results  Component Value Date   TSH 0.771 11/08/2021    Therapeutic Level Labs: No results found for: "LITHIUM" No results found for: "CBMZ" No results found for: "VALPROATE"  Current Medications: Current Outpatient Medications  Medication Sig Dispense Refill   ALPRAZolam (XANAX) 1 MG tablet Take 1 tablet (1 mg total) by mouth 2 (two) times daily as needed for anxiety. 60 tablet 1   amoxicillin-clavulanate (AUGMENTIN) 875-125 MG tablet Take 1 tablet by mouth 2 (two) times daily. 20 tablet  0   diphenhydrAMINE (BENADRYL) 25 mg capsule Take 25 mg by mouth every 6 (six) hours as needed.     pantoprazole (PROTONIX) 40 MG tablet Take 1 tablet (40 mg total) by mouth daily. 30 tablet 11   promethazine (PHENERGAN) 25 MG tablet Take 1 tablet (25 mg total) by mouth every 6 (six) hours as needed for nausea or vomiting. 120 tablet 1   DULoxetine (CYMBALTA) 30 MG capsule Take 3 capsules (90 mg total) by mouth daily. (Patient not taking: Reported on  10/21/2023) 270 capsule 1   sertraline (ZOLOFT) 50 MG tablet 50 mg daily then increase to 100 mg daily after 2 weeks. (Patient not taking: Reported on 10/21/2023) 90 tablet 1   No current facility-administered medications for this visit.   Musculoskeletal: Strength & Muscle Tone: within normal limits Gait & Station: normal Patient leans: N/A  Psychiatric Specialty Exam: Review of Systems  Constitutional:        No other complaints voiced  Psychiatric/Behavioral:  Negative for hallucinations, self-injury, sleep disturbance and suicidal ideas. Agitation: Only when she was taking the Zoloft which she stopped 3 days ago. Decreased concentration: At times. Dysphoric mood: Reports at times but main problem is her anxiety.The patient is nervous/anxious.   All other systems reviewed and are negative.   Blood pressure 103/69, pulse (!) 108, height 5' (1.524 m), weight 116 lb 12.8 oz (53 kg), SpO2 97%.Body mass index is 22.81 kg/m.  General Appearance: Casual  Eye Contact:  Good  Speech:  Clear and Coherent and Normal Rate  Volume:  Normal  Mood:  Anxious and Euthymic  Affect:  Appropriate and Congruent  Thought Process:  Coherent, Goal Directed, and Descriptions of Associations: Intact  Orientation:  Full (Time, Place, and Person)  Thought Content:  WDL and Logical  Suicidal Thoughts:  No  Homicidal Thoughts:  No  Memory:  Immediate;   Good Recent;   Good Remote;   Good  Judgement:  Intact  Insight:  Present  Psychomotor Activity:  Normal  Concentration:  Concentration: Good and Attention Span: Good  Recall:  Good  Fund of Knowledge:Good  Language: Good  Akathisia:  No  Handed:  Right  AIMS (if indicated):  done  Assets:  Communication Skills Desire for Improvement Housing Intimacy Leisure Time Physical Health Resilience Social Support  ADL's:  Intact  Cognition: WNL  Sleep:  Good   Screenings: AIMS    Flowsheet Row Office Visit from 10/21/2023 in [facility] [place]  AIMS Total Score 0      GAD-7    Flowsheet Row Office Visit from 10/21/2023 in [facility] [place] Office Visit from 09/10/2023 in [facility] Office Visit from 09/02/2023 in [facility] Office Visit from 04/03/2023 in [facility] Office Visit from 11/30/2022 in [facility]  Total GAD-7 Score 21 21 21 18 19       PHQ2-9    Flowsheet Row Office Visit from 10/21/2023 in [facility] [place] Office Visit from 09/10/2023 in [facility] Office Visit from 09/02/2023 in [facility] [place] Office Visit from 04/03/2023 in [facility] Office Visit from 11/30/2022 in [facility]  PHQ-2 Total Score 3 2 2 4 5   PHQ-9 Total Score 13 8 7 9 17       Flowsheet Row Office Visit from 10/21/2023 in  [facility] Admission (Discharged) from 05/30/2023 in [facility] [facility] ED from 02/01/2023 in [facility] RISK CATEGORY No Risk No Risk Error: Q3, 4, or 5 should not be populated when Q2 is No       Assessment and Plan: Cherylin Waguespack Englert appears to be doing fairly well however reporting depression and anxiety symptoms with anxiety being scientifically worse.  Wanting to know if she can be restarted on Cymbalta at 30 mg which is the dose she said worked best for her along with Alprazolam.  Informed that a Taper would be stared on the Alprazolam related to not recommended for long term use.  She then states that she did not come for medication management that her PCP would continue to manage her medications that she was only referred for counseling.  Informed that her PCP could seen this providers note in Epic and could see recommendations but  would also send a message.  Understanding voiced and no medications prescribed by this provider.  During visit she is dressed appropriate for age and weather.  She is sitting upright in chair with no noted distress.  She is alert/oriented x 4, calm/cooperative and mood is congruent with affect.  She spoke in a clear tone at moderate volume, and normal pace, with good eye contact.  Her thought process is coherent, relevant; and there is no indication that she is currently responding to internal/external stimuli, or experiencing delusional thought content.  She denies depression, anxiety, suicidal/self-harm/homicidal ideation, and psychosis.  However she does endorse paranoia related to the safety of her children.    She is also instructed to call 911, [phone], mobile crisis, or present to the nearest emergency room should she experience any suicidal/homicidal ideation, auditory/visual/hallucinations, or detrimental worsening of her mental health condition.  Collaboration of Care: Primary Care Provider AEB Secure message sent to PCP informing of recommendations since patient is not wanting medication management with psychiatry and Referral or follow-up with counselor/therapist AEB Referral to counseling and resources given  Patient/Guardian was advised Release of Information must be obtained prior to any record release in order to collaborate their care with an outside provider. Patient/Guardian was advised if they have not already done so to contact the registration department to sign all necessary forms in order for [place] to release information regarding their care.   Consent: Patient/Guardian gives verbal consent for treatment and assignment of benefits for services provided during this visit. Patient/Guardian expressed understanding and agreed to proceed.   Tacia Hindley, NP 2/17/202510:38 AM

## 2023-10-21 NOTE — Patient Instructions (Addendum)
Your primary care should be able to see my note and suggestions about medication management.  Online search at Psychology Today.  Enter the type of therapist looking for and the area you are in and should pull up therapist in your area.    Here are a List of outpatient providers that accept Medicaid and offer trauma therapy/counseling:  Behavioral Health Care in [place], [place] [facility] in [place], [place] provides Integrated Behavioral Health Services to people of all ages. The Integrated Behavioral Health Program applies an evidence-based approach to integrate behavioral health into primary care. This model incorporates mental health treatment into a traditional medical visit. Our philosophical standard values comprehensive wellness for patients. The program is led by Dale Royalton, [facility] Director. Aimee is a Administrator, Civil Service (LCSW). Our organizational vision in implementing this program is to improve patient wellness by serving as a Publishing rights manager for thorough healthcare management. [facility] provides high-quality care for behavioral health patients of all ages, including: Common mental health diagnoses such as Anxiety, Depression, ADD/ADHD, Bipolar, and PTSD Substance Abuse Evaluations and Counseling NARCAN/Naloxone Distribution Our Behavioral Health Clinicians [facility]) are ready to help you problem-solve through life stressors to promote healthy coping skills. Together we can identify how past challenges, such as traumatic events, are contributing to how you're functioning today. Our BHCs are happy to incorporate the principles of your value system to nurture your healing needs. We offer HIPAA-compliant trauma-informed telehealth and in-person therapy to residents of [place] and [place] at our [place], [place], and [place], [place] medical centers. We look forward to meeting and working with  you.  We'd love to hear from you!  OUR CONTACT INFO Address:  [address] [place], [place] [address] Hours Operation Monday 8:00AM - 5:00PM Tuesday 8:00AM - 7:00PM Wednesday 8:00AM - 5:00PM Thursday 8:00AM - 5:00PM Friday 8:00AM - 12:00PM  CALL us P: [phone] F: [phone] Email:  [email or web address]  Facebook/Messenger:  [email or web address] , [email or web address]    [facility]  Address:  [address] [place], [place] [address]  Phone:  [phone] Website:  [email or web address] We are here to serve clients ages 96 - 17, who are challenged by a multitude of behavioral health concerns to include substance use disorders, and complex mental health scenarios where both medical and psychiatric conditions coexist. Moreover, as a trained Healthcare Chaplain, I seek to provide incarnational care. The aim of " [facility]" is to be incarnational. Therefore, to provide a holistic treatment approach to all who call upon our services.  Behavioral Health Services & Treatments Depression  Substance Use Disorders  Mood Disorders  Psychodynamic Psychotherapy  [place] Charity fundraiser) Therapy Anxiety Disorders ADHD Urine Toxicology Screening Psychiatric Medication Management  Insurance [address], [address], [address], [address] and [place], [address] and [employer], [place], [place], [place], [employer], Control and instrumentation engineer, Optum, Cardinal Health (UMR), UnitedHealthcare UHC  UBH, [employer] of Network    Therapist, sports Health Counselor Associate, [place], [facility], CCTP Available both in-person and online [address] [place], [place] [address]  Phone:  [phone]  Specialties and Soil scientist Anxiety Depression Coping Skills Expertise ADHD Behavioral Issues Oppositional Defiance (ODD) School Issues Self Esteem Stress Trauma and PTSD  How are you doing? Not what you tell others,  Be honest with yourself How are you really doing?Marland Kitchen. Let's talk about what's really on your mind. I specialize in supporting individuals, children, and families as they  navigate life's challenges and work toward healing. My background includes experience in crisis support as a first responder, which has deepened my understanding of how acute and long-term stress impacts mental health. I use a person-centered approach, ensuring that your unique strengths, experiences, and goals guide our work together. My therapeutic style is eclectic, drawing from evidence-based modalities such as Cognitive Behavioral Therapy (CBT), Trauma-Focused Therapy, mindfulness practices, and somatic techniques to create a personalized path toward wellness.   Roberta Baker Licensed Clinical Mental Health Counselor Associate, LCMHC-A, [facility] Available both in-person and online Location:  [place] - [address], [place], [place], [address] Phone: [phone] Website:  [email or web address] Hello! I'm Roberta Baker, LCMHC-A, NCC. I earned my Master's in Clinical Mental Health Counseling and a Certificate in Marriage and Family Counseling from [employer]. My experience includes roles as a Science writer for individuals on the Autism Spectrum. I gained most of my clinical experience at a children's center, collaborating with a multidisciplinary team to provide top-notch care. I'm passionate about working with children, adolescents, young adults, and families facing anxiety, depression, trauma, self-esteem issues, and relational challenges. I use play therapy and expressive art techniques when traditional talk therapy isn't effective. My approaches include cognitive-behavioral, solution-focused, trauma-focused therapies, and MATCH-ADTC (Modular Approach to Therapy for Children with Anxiety, Depression, Trauma, and Conduct  problems). As your therapist, I'll tailor support to your needs and offer at-home activities to help you achieve your goals. My focus is on fostering self-awareness, growth, and confidence in a supportive environment of hope and empowerment. Outside work, I enjoy reading, trying new foods, and spending time with loved ones. Conditions Treated ADHD Anxiety/Phobias/Panic attacks Autism Bipolar disorder Childhood behavioral issues Couple's issues Depression Family Focus and concentration LGBTQ+ Obsessions or compulsions Postpartum or Peripartum Issues PTSD or Trauma Stress management   Roberta Baker Counselor, [facility], LCASA, NCC (she, her) Available online only Location:  [place], [place] [address] Phone:  [phone] My clients can expect an environment free of judgment, full of understanding and empathy, where they can face the challenges that they have in a supportive environment. My personal background allows me to look at client issues from a different view and offer real world healing that meets the client where they are. My client's come from various backgrounds, but all seeking to better understand themselves and a better way of coping. Because I'm seeing clients virtually, they feel freer to share more intimate and personal concerns that might be harder to talk about in person. Top Specialties LGBTQ+ Sex Therapy Expertise Addiction ADHD Anxiety Behavioral Issues Bipolar Disorder Body Positivity Borderline Personality (BPD) Cancer Child Chronic Pain Coping Skills Depression Life Transitions Marital and Premarital Obesity Open Relationships Non-Monogamy Parenting Peer Relationships Relationship Issues Self Esteem Sex-Positive, Kink Allied Sexual Abuse Sexual Addiction Stress Trauma and PTSD Weight Loss   [employer] Professional, MDiv, Med Available online only [address], [place] [address] Phone:  [phone] Website:  [email or web address] Welcome! My name is Roberta Baker, and I am passionate about helping individuals navigate life's challenges, develop resilience, and unlock their full potential. With over a decade of experience in counseling, ministry, and coaching, I provide a holistic approach to personal and spiritual development, drawing on my diverse professional background and extensive training. Administrator Spirituality Education and Learning Disabilities Mood Disorders Expertise ADHD Anxiety Pension scheme manager Counseling Child Coping Skills Depression Family Conflict First Responders Grief Intellectual Disability  Life Coaching Life Transitions Marital and Premarital Parenting Peer Relationships Relationship Actuary Esteem Sexual Abuse Teen Violence Trauma and PTSD  Contact Details  Locate [place] at [address], [place], [place] [address] [address] [employer] [place], [place] [address]  We are a full telehealth service company as we do not do in person visits.  Message or Call us at  Email:  [email or web address]   Phone: [phone]  FAX: [phone]  Who We Are [facility] is a leading provider of personalized mental health care in [place], dedicated to fostering healing and growth in a safe, compassionate environment. Our team of experienced professionals offers innovative solutions tailored to meet the unique needs of each individual, helping them overcome challenges and improve their overall quality of life. We accept most major insurances.  You Are Our Top Priority  Mission Statement [facility] offers personalized mental health care in a safe and compassionate environment. Our team provides effective treatments and innovative solutions that empower clients to overcome challenges and improve their quality of life.  Vision Statement [facility] aims to redefine mental health care standards through excellence, compassion, and innovation. We aim to make mental health care accessible and destigmatized, ensuring everyone receives the necessary support to thrive.  Services: Anxiety Depression Bipolar disorder Personality Disorder PTSD ADHD Schizophrenia Mood disorder Insomnia Neuropsychological Testing for ADHD and Other Disorders  Comprehensive neuropsychological testing for a better tomorrow Understanding Neuropsychological Testing Neuropsychological testing is a specialized evaluation method used to assess cognitive functions, behaviors, and mental processing abilities. These tests are essential for diagnosing and managing conditions such as Attention-Deficit/Hyperactivity Disorder (ADHD), learning disabilities, autism spectrum disorders, mood disorders, and other neurological or psychological conditions. Unlike traditional assessments, neuropsychological testing provides objective, measurable data about an individual's cognitive abilities. This helps clinicians develop personalized treatment plans that address specific challenges and improve daily functioning.  Why Neuropsychological Testing is Important for ADHD ADHD is a complex neurodevelopmental disorder that affects attention, impulsivity, and executive function. Because its symptoms often overlap with other conditions, neuropsychological testing helps differentiate ADHD from other disorders, ensuring an accurate diagnosis and appropriate intervention. Through comprehensive testing, clinicians can evaluate:  Attention and Focus - Identifying difficulties in sustained and selective attention Executive Functioning - Assessing skills such as impulse control, problem-solving, and organization Memory and Processing Speed - Measuring how quickly and efficiently information is understood and retained Behavioral and Emotional Regulation - Understanding mood-related symptoms  that may be associated with ADHD Key Neuropsychological Tests Neuropsychological assessments typically include a combination of standardized tests that evaluate different cognitive domains. Some of the most commonly used tests include:  Continuous Performance Test (CPT) Measures sustained attention and response control. Helps identify inattention, impulsivity, and distractibility. Stroop Test Assesses cognitive flexibility and processing speed. Evaluates an individual's ability to control automatic responses and focus on specific tasks. [employer] Test (WCST) Measures executive function, problem-solving, and cognitive flexibility. Helps assess an individual's ability to adapt to changing rules and instructions. Trail Making Test (TMT) Assesses visual attention, processing speed, and task-switching abilities. Commonly used to detect cognitive impairments in ADHD and other disorders. N-Back Test Evaluates working memory and attentional capacity. Helps measure an individual's ability to hold and manipulate information over short periods.  Neuropsychological Testing for Other Disorders Beyond ADHD, neuropsychological testing is a crucial tool for diagnosing and managing a wide range of neurological and psychological conditions. These assessments can help identify cognitive deficits and guide targeted interventions  for various disorders, including: Learning Disabilities Helps assess difficulties in reading, writing, or mathematics Identifies underlying cognitive challenges that may be affecting academic performance Autism Spectrum Disorder (ASD) Evaluates cognitive flexibility, attention, and executive functioning Helps determine the presence of social and communication deficits Mood Disorders (Depression, Anxiety, Bipolar Disorder) Identifies cognitive impairments related to emotional regulation and stress response Assesses memory, attention, and problem-solving skills affected  by mood disorders Traumatic Brain Injury (TBI) and Concussions Measures cognitive changes due to brain injuries Helps monitor recovery progress and guide rehabilitation efforts Dementia and Neurodegenerative Disorders Evaluates memory, processing speed, and executive function in conditions like Alzheimer's and Parkinson's disease Assists in early detection and progression monitoring  Benefits of Neuropsychological Testing Accurate Diagnosis - Differentiates ADHD from other conditions with similar symptoms, such as anxiety or learning disabilities Personalized Treatment Planning - Identifies cognitive strengths and weaknesses to tailor individualized intervention Tracking Progress - Enables clinicians to monitor changes over time and assess the effectiveness of treatments or medication Guiding Educational and Workplace Accommodations - Provides data to support school or workplace modifications for individuals struggling with cognitive challenges  Who Should Consider Neuropsychological Testing? Individuals experiencing difficulties with attention, memory, problem-solving, or emotional regulation may benefit from neuropsychological testing. This assessment is ideal for: Children and adults with suspected ADHD or learning disabilities Individuals with behavioral or emotional challenges affecting daily life Those recovering from neurological injuries or illnesses impacting cognitive function Anyone seeking a deeper understanding of their cognitive abilities to optimize performance in school, work, or daily activities  What to Expect During a Neuropsychological Evaluation The testing process typically includes: Clinical Interview - Gathering medical and developmental history Standardized Testing - Completing various cognitive tasks on a computer or paper Behavioral Assessments - Using rating Baker and questionnaires completed by the individual and/or caregivers Comprehensive Report - A detailed  breakdown of strengths, weaknesses, and personalized recommendations    Offers Virtual Therapy  [address] [place], [place] [address]  Phone:  [phone] Fax: [phone]  [facility] Outpatient Program (OP) An Outpatient Treatment Program, or OP, is a service for individuals seeking support for substance abuse or mental health concerns who do not require frequent or intense support or safety monitoring. This level is appropriate for people with less severe disorders, or as a step-down from more intensive services. At [facility], OP is available for individuals aged 51 and above. It consists of basic treatment services offered in individual and group format and totals to less than 9 hours a week. Both virtual and in-person options for attending are available. OP is conducted by treatment professionals licensed to serve individuals with mental health and substance use disorders within [place]. OP helps individuals address a broad range of psychological and interpersonal challenges including: Substance Related Disorders Behavioral Addictions Anxiety Depression Trauma and Stressor Related Issues PTSD Parenting and Family Issues Relationship Issues Stress Self-Esteem Bipolar Disorders Life Transitions Personality Disorders ADHD Grief and Loss  *[facility] is proud to offer specialized treatment services on an outpatient level including, EMDR, Marriage/Family Counseling and Christian Counseling  We currently accept all major insurance, including: Medicaid,Uninsured, Blue [employer], [address], [employer], [employer], [place], Optum Serve, Value Options, [employer], [employer] [facility] Phone:  [phone] Website:  [email or web address]           How to get started with virtual therapy covered by Medicaid: Medicaid-covered members can call our Admissions Team 24/7 or fill out  our online form to learn about their plan's specific benefits and get started with treatment. Once we verify your Medicaid benefits, our Clinical Team will conduct a thorough mental health assessment to create your personalized treatment plan. Medicaid members can get started with their personalized treatment plan (which includes curated groups, individual therapy, and family therapy) in as little as 24 hours. Call [phone]  What we Treat:  Anxiety Treatment for Teens and Adults  Our therapists specialize in cognitive behavioral therapy, a leading anxiety treatment, to provide evidence-based mental healthcare for teens and adults dealing with anxiety disorders. Fill out the short form below or call us directly to start healing from anxiety today with [facility].  Depression Treatment for Teens and Adults Depression affects millions of people worldwide, but healing is possible with evidence-based treatment.   Trauma Treatment for Teens and Adults After surviving trauma, building connections and receiving trauma-informed care are critical for long-lasting healing. That's why [facility] offers trauma-informed therapy in individual and group sessions.   Self-Harm Treatment for Teens and Adults Self-harm is often linked to serious mental health issues, which is why understanding the root of self-harm is key to long-lasting recovery.   Suicidal ideation Passive suicidal ideation, chronic suicidal ideation, previous suicide attempt  Substance Use Disorders Treatment for Teens and Adults:   Alcohol, marijuana, prescription drugs, opioids, amphetamines, cocaine, inhalants, hallucinogens, nicotine Understanding the mental health roots of substance use disorders (SUD) is key to long-lasting recovery.

## 2023-10-30 ENCOUNTER — Ambulatory Visit: Payer: Medicaid Other | Admitting: Family Medicine

## 2023-11-07 ENCOUNTER — Ambulatory Visit: Admitting: Family Medicine

## 2023-11-07 VITALS — BP 100/60 | HR 104 | Ht 60.0 in | Wt 115.0 lb

## 2023-11-07 DIAGNOSIS — F418 Other specified anxiety disorders: Secondary | ICD-10-CM | POA: Diagnosis not present

## 2023-11-07 MED ORDER — DULOXETINE HCL 60 MG PO CPEP
60.0000 mg | ORAL_CAPSULE | Freq: Every day | ORAL | 3 refills | Status: DC
Start: 2023-11-07 — End: 2024-06-29

## 2023-11-07 NOTE — Progress Notes (Signed)
 Subjective:  Patient ID: Roberta Baker, female    DOB: 1986/02/25  Age: 38 y.o. MRN: [id number]  CC:   Chief Complaint  Patient presents with   Depression    anxiety    HPI:  38 year old female presents for follow-up.  Patient reports that she has had worsening anxiety since switch to Zoloft..  She states that she feels that the medication makes her irritable and angry as well.  As a result she has been taking 50 mg every other day.  Patient states that she is interested in restarting Cymbalta.  She would like to discuss this today.  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   Depression with anxiety 12/12/2016    Social Hx   Social History   Socioeconomic History   Marital status: Single    Spouse name: Not on file   Number of children: 1   Years of education: Not on file   Highest education level: Some college, no degree  Occupational History   Not on file  Tobacco Use   Smoking status: Every Day    Current packs/day: 0.75    Average packs/day: 0.8 packs/day for 0.5 years (0.4 ttl pk-yrs)    Types: Cigarettes   Smokeless tobacco: Never  Vaping Use   Vaping status: Never Used  Substance and Sexual Activity   Alcohol use: No   Drug use: Yes    Types: Marijuana    Comment: occasional   Sexual activity: Not Currently    Birth control/protection: Surgical    Comment: tubal  Other Topics Concern   Not on file  Social History Narrative   Not on file   Social Drivers of Health   Financial Resource Strain: Low Risk  (02/08/2023)   Overall Financial Resource Strain (CARDIA)    Difficulty of Paying Living Expenses: Not hard at all  Food Insecurity: No Food Insecurity (02/08/2023)   Hunger Vital Sign    Worried About Running Out of Food in the  Last Year: Never true    Ran Out of Food in the Last Year: Never true  Transportation Needs: No Transportation Needs (02/08/2023)   PRAPARE - Administrator, Civil Service (Medical): No    Lack of Transportation (Non-Medical): No  Physical Activity: Insufficiently Active (02/08/2023)   Exercise Vital Sign    Days of Exercise per Week: 4 days    Minutes of Exercise per Session: 30 min  Stress: Stress Concern Present (02/08/2023)   [employer] of Occupational Health - Occupational Stress Questionnaire    Feeling of Stress : Rather much  Social Connections: Moderately Isolated (06/02/2021)   Social Connection and Isolation Panel [NHANES]    Frequency of Communication with Friends and Family: More than three times a week    Frequency of Social Gatherings with Friends and Family: More than three times a week    Attends Religious Services: Never    Database administrator or Organizations: No    Attends Banker Meetings: Never    Marital Status: Living with partner    Review of Systems Per HPI  Objective:  BP 100/60   Pulse (!) 104   Ht 5' (1.524 m)   Wt 115 lb (52.2 kg)   SpO2 98%  BMI 22.46 kg/m      11/07/2023   11:24 AM 10/21/2023    9:38 AM 09/10/2023    8:42 AM  BP/Weight  Systolic BP 100 103 98  Diastolic BP 60 69 63  Wt. (Lbs) 115 116.8 122.4  BMI 22.46 kg/m2 22.81 kg/m2 23.9 kg/m2    Physical Exam Vitals and nursing note reviewed.  Constitutional:      General: She is not in acute distress.    Appearance: Normal appearance.  HENT:     Head: Normocephalic and atraumatic.  Cardiovascular:     Rate and Rhythm: Normal rate and regular rhythm.  Pulmonary:     Effort: Pulmonary effort is normal.     Breath sounds: Normal breath sounds.  Neurological:     Mental Status: She is alert.  Psychiatric:        Mood and Affect: Mood normal.        Behavior: Behavior normal.     Lab Results  Component Value Date   WBC 7.7 01/12/2022   HGB  11.4 (L) 01/12/2022   HCT 35.0 (L) 01/12/2022   PLT 404 (H) 01/12/2022   GLUCOSE 97 04/22/2023   CHOL 214 (H) 11/08/2021   TRIG 138 11/08/2021   HDL 36 (L) 11/08/2021   LDLCALC 153 (H) 11/08/2021   ALT 11 04/22/2023   AST 16 04/22/2023   NA 140 04/22/2023   K 3.9 04/22/2023   CL 104 04/22/2023   CREATININE 0.76 04/22/2023   BUN 6 04/22/2023   CO2 24 04/22/2023   TSH 0.771 11/08/2021   INR 0.98 10/25/2018     Assessment & Plan:  Depression with anxiety Assessment & Plan: Recent worsening and side effects from Zoloft.  Currently uncontrolled.  Stopping Zoloft.  Restarting Cymbalta.  Follow-up in 3 months.  Orders: -     DULoxetine HCl; Take 1 capsule (60 mg total) by mouth daily.  Dispense: 90 capsule; Refill: 3    Follow-up:  Return in about 3 months (around 02/07/2024).  Everlene Other DO [facility]

## 2023-11-07 NOTE — Assessment & Plan Note (Signed)
 Recent worsening and side effects from Zoloft.  Currently uncontrolled.  Stopping Zoloft.  Restarting Cymbalta.  Follow-up in 3 months.

## 2023-11-07 NOTE — Patient Instructions (Signed)
 Stop Zoloft. Start Cymbalta.  Follow up in 3 months.  Take care  Dr. Adriana Simas

## 2023-11-11 ENCOUNTER — Ambulatory Visit: Payer: Medicaid Other | Admitting: Gastroenterology

## 2023-11-11 ENCOUNTER — Encounter: Payer: Self-pay | Admitting: Gastroenterology

## 2023-11-11 NOTE — Progress Notes (Deleted)
 GI Office Note    Referring Provider: [name], DO Primary Care Physician:  [name], DO Primary Gastroenterologist: Hennie Duos. Marletta Lor, DO  Date:  11/11/2023  ID:  Roberta Baker, DOB [birth year], MRN [id number]   Chief Complaint   No chief complaint on file.  History of Present Illness  Roberta Baker is a 38 y.o. female with a history of *** presenting today for follow up of nausea.   Initial office visit 04/18/2023.  Presented for evaluation of chronic nausea.  She states this started after having her first child 6 years prior.  Taking Phenergan daily which helps keep symptoms at bay.  She felt as though her Cymbalta may be contributing slightly although she was having nausea prior to this.  She did note marijuana use but states is very seldom(1-2 times per month).  Denied any epigastric pain chest pain, reflux, indigestion.  Reportedly bowels moving well without constipation or diarrhea.  Does have some occasional abdominal bloating that is mild and intermittent.  Advise schedule EGD for further evaluation.  TSH noted to be within normal limits check labs including celiac, CRP/ESR.   Celiac panel strongly positive.  CRP and ESR within normal limits.   EGD 05/30/2023: -Gastritis s/p biopsy -Normal duodenum s/p biopsy -Start pantoprazole 40 mg once daily -Phenergan 25 mg p.o. 3 times a day as needed -Pathology without evidence of celiac.  Gastric biopsies with chronic inactive gastritis, negative H. pylori.  Last office visit 08/13/23. Having constant nausea still. On Cymbalta but states she thinks that is making her stomach worse    Today:    Wt Readings from Last 3 Encounters:  11/07/23 115 lb (52.2 kg)  09/10/23 122 lb 6.4 oz (55.5 kg)  09/02/23 119 lb 12.8 oz (54.3 kg)    Current Outpatient Medications  Medication Sig Dispense Refill   ALPRAZolam (XANAX) 1 MG tablet Take 1 tablet (1 mg total) by mouth 2 (two) times daily as needed for anxiety. 60  tablet 1   diphenhydrAMINE (BENADRYL) 25 mg capsule Take 25 mg by mouth every 6 (six) hours as needed.     DULoxetine (CYMBALTA) 60 MG capsule Take 1 capsule (60 mg total) by mouth daily. 90 capsule 3   pantoprazole (PROTONIX) 40 MG tablet Take 1 tablet (40 mg total) by mouth daily. 30 tablet 11   promethazine (PHENERGAN) 25 MG tablet Take 1 tablet (25 mg total) by mouth every 6 (six) hours as needed for nausea or vomiting. 120 tablet 1   No current facility-administered medications for this visit.    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    Past Surgical History:  Procedure Laterality Date   BIOPSY  05/30/2023   Procedure: BIOPSY;  Surgeon: Lanelle Bal, DO;  [facility];  Service: Endoscopy;;   BREAST ENHANCEMENT SURGERY     BREAST SURGERY     ESOPHAGOGASTRODUODENOSCOPY (EGD) WITH PROPOFOL N/A 05/30/2023   Procedure: ESOPHAGOGASTRODUODENOSCOPY (EGD) WITH PROPOFOL;  Surgeon: Lanelle Bal, DO;  [facility];  Service: Endoscopy;  Laterality: N/A;  12:00pm, asa 2   LAPAROSCOPIC BILATERAL SALPINGECTOMY Bilateral  01/16/2022   Procedure: LAPAROSCOPIC BILATERAL SALPINGECTOMY;  Surgeon: Myna Hidalgo, DO;  Location: [facility];  Service: Gynecology;  Laterality: Bilateral;   LEEP N/A 01/16/2022   Procedure: LOOP ELECTROSURGICAL EXCISION PROCEDURE (LEEP);  Surgeon: Myna Hidalgo, DO;  Location: [facility];  Service: Gynecology;  Laterality: N/A;    Family History  Problem Relation Age of Onset   Cancer Maternal Grandmother        breast cancer   Heart attack Maternal Grandfather    Mental illness Mother    Seizures Mother    Cancer Maternal Uncle     Allergies as of 11/11/2023 - Review Complete  11/07/2023  Allergen Reaction Noted   Buspar [buspirone] Other (See Comments) 11/08/2021   Sulfa antibiotics Nausea And Vomiting 09/28/2012    Social History   Socioeconomic History   Marital status: Single    Spouse name: Not on file   Number of children: 1   Years of education: Not on file   Highest education level: Some college, no degree  Occupational History   Not on file  Tobacco Use   Smoking status: Every Day    Current packs/day: 0.75    Average packs/day: 0.8 packs/day for 0.5 years (0.4 ttl pk-yrs)    Types: Cigarettes   Smokeless tobacco: Never  Vaping Use   Vaping status: Never Used  Substance and Sexual Activity   Alcohol use: No   Drug use: Yes    Types: Marijuana    Comment: occasional   Sexual activity: Not Currently    Birth control/protection: Surgical    Comment: tubal  Other Topics Concern   Not on file  Social History Narrative   Not on file   Social Drivers of Health   Financial Resource Strain: Low Risk  (02/08/2023)   Overall Financial Resource Strain (CARDIA)    Difficulty of Paying Living Expenses: Not hard at all  Food Insecurity: No Food Insecurity (02/08/2023)   Hunger Vital Sign    Worried About Running Out of Food in the Last Year: Never true    Ran Out of Food in the Last Year: Never true  Transportation Needs: No Transportation Needs (02/08/2023)   PRAPARE - Administrator, Civil Service (Medical): No    Lack of Transportation (Non-Medical): No  Physical Activity: Insufficiently Active (02/08/2023)   Exercise Vital Sign    Days of Exercise per Week: 4 days    Minutes of Exercise per Session: 30 min  Stress: Stress Concern Present (02/08/2023)   [employer] of Occupational Health - Occupational Stress Questionnaire    Feeling of Stress : Rather much  Social Connections: Moderately Isolated (06/02/2021)   Social Connection and Isolation Panel [NHANES]    Frequency of Communication with Friends and Family: More than three  times a week    Frequency of Social Gatherings with Friends and Family: More than three times a week    Attends Religious Services: Never    Database administrator or Organizations: No    Attends Banker Meetings: Never    Marital Status: Living with partner     Review of Systems   Gen: Denies fever, chills, anorexia. Denies fatigue, weakness, weight loss.  CV: Denies chest pain, palpitations, syncope, peripheral edema, and claudication. Resp: Denies dyspnea at rest, cough, wheezing, coughing up blood, and pleurisy. GI: See HPI Derm: Denies rash, itching, dry skin Psych: Denies depression, anxiety, memory loss, confusion. No homicidal or suicidal ideation.  Heme: Denies bruising, bleeding,  and enlarged lymph nodes.  Physical Exam   There were no vitals taken for this visit.  General:   Alert and oriented. No distress noted. Pleasant and cooperative.  Head:  Normocephalic and atraumatic. Eyes:  Conjuctiva clear without scleral icterus. Mouth:  Oral mucosa pink and moist. Good dentition. No lesions. Lungs:  Clear to auscultation bilaterally. No wheezes, rales, or rhonchi. No distress.  Heart:  S1, S2 present without murmurs appreciated.  Abdomen:  +BS, soft, non-tender and non-distended. No rebound or guarding. No HSM or masses noted. Rectal: *** Msk:  Symmetrical without gross deformities. Normal posture. Extremities:  Without edema. Neurologic:  Alert and  oriented x4 Psych:  Alert and cooperative. Normal mood and affect.  Assessment  Roberta Baker is a 38 y.o. female with a history of *** presenting today with   Chronic Nausea:  Gastritis:  Positive celiac serology:  PLAN   *** GES? Gluten free? Pantoprazole 40 mg daily.  Avoid lactose     Brooke Bonito, MSN, FNP-BC, AGACNP-BC [facility]

## 2023-11-19 ENCOUNTER — Other Ambulatory Visit: Payer: Self-pay | Admitting: Family Medicine

## 2023-11-19 DIAGNOSIS — F418 Other specified anxiety disorders: Secondary | ICD-10-CM

## 2023-12-06 NOTE — Telephone Encounter (Signed)
 Copied from CRM [id number]. Topic: Clinical - Medication Refill >> Dec 06, 2023 11:42 AM Louie Casa B wrote: Most Recent Primary Care Visit:  Provider: [name]  Department: [facility]  Visit Type: ACUTE  Date: 11/07/2023  Medication: ALPRAZolam Prudy Feeler) 1 MG tablet  Has the patient contacted their pharmacy? Yes (Agent: If no, request that the patient contact the pharmacy for the refill. If patient does not wish to contact the pharmacy document the reason why and proceed with request.) (Agent: If yes, when and what did the pharmacy advise?)  Is this the correct pharmacy for this prescription? Yes If no, delete pharmacy and type the correct one.  This is the patient's preferred pharmacy:  [facility] [phone] - [place], [place] - [address] [facility] [address]  [place] [address] Phone: [phone] Fax: [phone]     Has the prescription been filled recently? Yes  Is the patient out of the medication? Yes  Has the patient been seen for an appointment in the last year OR does the patient have an upcoming appointment? Yes  Can we respond through MyChart? No  Agent: Please be advised that Rx refills may take up to 3 business days. We ask that you follow-up with your pharmacy.

## 2023-12-08 MED ORDER — ALPRAZOLAM 1 MG PO TABS
1.0000 mg | ORAL_TABLET | Freq: Two times a day (BID) | ORAL | 1 refills | Status: DC | PRN
Start: 2023-12-14 — End: 2024-01-12

## 2023-12-10 ENCOUNTER — Telehealth: Payer: Self-pay | Admitting: Gastroenterology

## 2023-12-10 ENCOUNTER — Other Ambulatory Visit: Payer: Self-pay | Admitting: Gastroenterology

## 2023-12-10 DIAGNOSIS — R112 Nausea with vomiting, unspecified: Secondary | ICD-10-CM

## 2023-12-10 MED ORDER — PROMETHAZINE HCL 25 MG PO TABS: 25.0000 mg | ORAL_TABLET | Freq: Four times a day (QID) | ORAL | 0 refills | Status: DC | PRN

## 2023-12-10 NOTE — Telephone Encounter (Signed)
 Pt has scheduled an appt for 4/14 but asked if she could have enough of the nausea medication until she gets here Monday.  She said she is unable to take her other medication because she is so nauseous.

## 2023-12-10 NOTE — Telephone Encounter (Signed)
Noted. Informed pt.  

## 2023-12-11 NOTE — Progress Notes (Signed)
 GI Office Note    Referring Provider: Cook, Jayce G, DO Primary Care Physician:  Cook, Jayce G, DO Primary Gastroenterologist: Rolando Cliche. Mordechai April, DO  Date:  12/16/2023  ID:  Roberta Baker, DOB [birth year], MRN [id number]  Chief Complaint   Chief Complaint  Patient presents with   Follow-up    Needs refills on nausea meds also here for follow up on chronic nausea   History of Present Illness  Roberta Baker is a 38 y.o. 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   Initial office visit 04/18/2023.  Presented for evaluation of chronic nausea.  She states this started after having her first child 6 years prior.  Taking Phenergan daily which helps keep symptoms at bay.  She felt as though her Cymbalta may be contributing slightly although she was having nausea prior to this.  She did note marijuana use but states is very seldom(1-2 times per month).  Denied any epigastric pain chest pain, reflux, indigestion.  Reportedly bowels moving well without constipation or diarrhea.  Does have some occasional abdominal bloating that is mild and intermittent.  Advise schedule EGD for further evaluation.  TSH noted to be within normal limits check labs including celiac, CRP/ESR.   Celiac panel strongly positive.  CRP and ESR within normal limits.   EGD 05/30/2023: -Gastritis s/p biopsy -Normal duodenum s/p biopsy -Start pantoprazole 40 mg once daily -Phenergan 25 mg p.o. 3 times a day as needed -Pathology without evidence of celiac.  Gastric biopsies with chronic inactive gastritis, negative H. pylori.   Last office visit 08/13/23. Having constant nausea still. On Cymbalta but states she thinks that is making her stomach worse. Taking imodium during cycles to help with abdominal cramping. Going many days without marijuana. Advised strict gluten free diet with repeat celiac labs.  Continue PPI once daily. Phenergan as needed. Consider GES and/or HIDA.   Today:  Has been dealing with sinus issues more recently and having lots of itchy and watery eyes. Taking benadryl only. Tried Mucinex.   Nausea has been slightly worse with the sinus issues. Nausea has never went away since her first pregnancy. Switched to zoloft from cymbalta and ended up punching a hole in her wall after 4 days (gets anger from it). She loves Cymbalta for her anxiety/depression. She reports the nausea was worse while taking the zoloft. Appetite disappeared on zoloft and then resumed once she resumed Cymbalta.  She will vomit if she does not have medication. Will dry heave if nothing else to vomit up. Can eat normal with her medication but if she does not have it then 2-3 bites she is full.   Taking pantoprazole every other day, does not have typical reflux symptoms. Does not eat fried or spicy. She does drink coca cola everyday.   She is taking phenergan 2 in the morning and 2 in the evening.   Has been trying to follow gluten free snacks and foods.   Wt Readings from Last 3 Encounters:  12/16/23 115 lb 9.6 oz (52.4 kg)  11/07/23 115 lb (52.2 kg)  09/10/23 122 lb 6.4 oz (55.5 kg)    Current Outpatient Medications  Medication Sig Dispense Refill   ALPRAZolam (XANAX) 1 MG tablet Take 1 tablet (1 mg total) by mouth 2 (two) times daily as needed for anxiety. 60 tablet 1   diphenhydrAMINE (BENADRYL) 25 mg capsule Take 25 mg by mouth every 6 (  six) hours as needed.     DULoxetine (CYMBALTA) 60 MG capsule Take 1 capsule (60 mg total) by mouth daily. 90 capsule 3   pantoprazole (PROTONIX) 40 MG tablet Take 1 tablet (40 mg total) by mouth daily. 30 tablet 11   promethazine (PHENERGAN) 25 MG tablet Take 1 tablet (25 mg total) by mouth every 6 (six) hours as needed for nausea or vomiting. 50 tablet 0   No current facility-administered medications for this visit.    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    Past Surgical History:  Procedure Laterality Date   BIOPSY  05/30/2023   Procedure: BIOPSY;  Surgeon: Vinetta Greening, DO;  [facility];  Service: Endoscopy;;   BREAST ENHANCEMENT SURGERY     BREAST SURGERY     ESOPHAGOGASTRODUODENOSCOPY (EGD) WITH PROPOFOL N/A 05/30/2023   Procedure: ESOPHAGOGASTRODUODENOSCOPY (EGD) WITH PROPOFOL;  Surgeon: Vinetta Greening, DO;  [facility];  Service: Endoscopy;  Laterality: N/A;  12:00pm, asa 2   LAPAROSCOPIC BILATERAL SALPINGECTOMY Bilateral 01/16/2022   Procedure: LAPAROSCOPIC BILATERAL SALPINGECTOMY;  Surgeon: Ozan, Jennifer, DO;  Location: [facility];  Service: Gynecology;  Laterality: Bilateral;   LEEP N/A 01/16/2022   Procedure: LOOP ELECTROSURGICAL EXCISION PROCEDURE (LEEP);  Surgeon: Ozan, Jennifer, DO;  Location: [facility];  Service: Gynecology;  Laterality: N/A;    Family History  Problem Relation Age of Onset   Cancer Maternal Grandmother        breast cancer   Heart attack Maternal Grandfather    Mental illness Mother    Seizures Mother    Cancer Maternal Uncle     Allergies as of 12/16/2023 - Review Complete 12/16/2023  Allergen Reaction Noted   Buspar [buspirone] Other (See Comments) 11/08/2021   Sulfa antibiotics Nausea And Vomiting 09/28/2012    Social History   Socioeconomic History   Marital status: Single    Spouse name: Not on file   Number of children: 1   Years of education: Not on file   Highest education level: Some college, no degree  Occupational History   Not on file  Tobacco Use   Smoking status: Every Day    Current packs/day: 0.75    Average packs/day: 0.8 packs/day for 0.5 years (0.4 ttl pk-yrs)    Types: Cigarettes    Smokeless tobacco: Never  Vaping Use   Vaping status: Never Used  Substance and Sexual Activity   Alcohol use: No   Drug use: Yes    Types: Marijuana    Comment: occasional   Sexual activity: Not Currently    Birth control/protection: Surgical    Comment: tubal  Other Topics Concern   Not on file  Social History Narrative   Not on file   Social Drivers of Health   Financial Resource Strain: Low Risk  (02/08/2023)   Overall Financial Resource Strain (CARDIA)    Difficulty of Paying Living Expenses: Not hard at all  Food Insecurity: No Food Insecurity (02/08/2023)   Hunger Vital Sign    Worried About Running Out of Food in the Last Year: Never true    Ran Out of Food  in the Last Year: Never true  Transportation Needs: No Transportation Needs (02/08/2023)   PRAPARE - Administrator, Civil Service (Medical): No    Lack of Transportation (Non-Medical): No  Physical Activity: Insufficiently Active (02/08/2023)   Exercise Vital Sign    Days of Exercise per Week: 4 days    Minutes of Exercise per Session: 30 min  Stress: Stress Concern Present (02/08/2023)   [employer] of Occupational Health - Occupational Stress Questionnaire    Feeling of Stress : Rather much  Social Connections: Moderately Isolated (06/02/2021)   Social Connection and Isolation Panel [NHANES]    Frequency of Communication with Friends and Family: More than three times a week    Frequency of Social Gatherings with Friends and Family: More than three times a week    Attends Religious Services: Never    Database administrator or Organizations: No    Attends Banker Meetings: Never    Marital Status: Living with partner     Review of Systems   Gen: Denies fever, chills, anorexia. Denies fatigue, weakness, weight loss.  CV: Denies chest pain, palpitations, syncope, peripheral edema, and claudication. Resp: Denies dyspnea at rest, cough, wheezing, coughing up blood, and pleurisy. GI: See  HPI Derm: Denies rash, itching, dry skin Psych: + Anxiety and depression.  Denies memory loss, confusion. No homicidal or suicidal ideation.  Heme: Denies bruising, bleeding, and enlarged lymph nodes.  Physical Exam   BP 99/68 (BP Location: Right Arm, Patient Position: Sitting, Cuff Size: Normal)   Pulse 99   Temp 98.6 F (37 C) (Oral)   Ht 5' (1.524 m)   Wt 115 lb 9.6 oz (52.4 kg)   LMP 11/16/2023 (Exact Date)   SpO2 97%   BMI 22.58 kg/m   General:   Alert and oriented. No distress noted. Pleasant and cooperative.  Head:  Normocephalic and atraumatic. Eyes:  Conjuctiva clear without scleral icterus. Mouth:  Oral mucosa pink and moist. Good dentition. No lesions. Abdomen:  +BS, soft, non-tender and non-distended. No rebound or guarding. No HSM or masses noted. Rectal: deferred Msk:  Symmetrical without gross deformities. Normal posture. Extremities:  Without edema. Neurologic:  Alert and  oriented x4 Psych:  Alert and cooperative. Normal mood and affect.  Assessment  Roberta Baker is a 38 y.o. 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    Chronic Nausea: Continues to have chronic nausea.  Celiac could be playing a role although she did have negative duodenal biopsies on EGD despite positive serologies.  Still believes she could have some gluten sensitivity.  Has had chronic nausea since first pregnancy.  Does believe Cymbalta makes some of this worse however this is what works for her anxiety/depression therefore she is willing to deal with the nausea and treated.  Zofran has caused migraines in the past and unable to tolerate Compazine.  Tolerates Phenergan very well, takes 2 tablets in the morning and 2 tablets in the evening.  No side effects noted from this.  For now we will refill Phenergan and will assess with gastric emptying study to rule out  gastroparesis.  Gastritis: Prior EGD with chronic inactive gastritis noted.  Currently on pantoprazole 40 mg, she is taking every other day.  Has not changed her nausea at all.  Denies epigastric pain or typical reflux symptoms.  Positive celiac serology: Serologies have been positive although duodenal biopsies negative  on EGD.  Continue to encourage gluten-free diet.  She has been trying to adhere to this as best as possible.  Does not notice any worsening symptoms with any particular foods.  Has history of anemia although denies any significant fatigue, dizziness, chest pain, shortness of breath, or rectal bleeding.  Will consider checking iron panel and CBC at next visit.  PLAN   GES Ttg IgA Gluten free diet as best as possible.  Pantoprazole 40 mg daily.  Avoid lactose Continue phenergan twice daily.  Follow up in 3 months.    Julian Obey, MSN, FNP-BC, AGACNP-BC [name]

## 2023-12-16 ENCOUNTER — Ambulatory Visit: Admitting: Gastroenterology

## 2023-12-16 ENCOUNTER — Encounter: Payer: Self-pay | Admitting: *Deleted

## 2023-12-16 ENCOUNTER — Encounter: Payer: Self-pay | Admitting: Gastroenterology

## 2023-12-16 VITALS — BP 99/68 | HR 99 | Temp 98.6°F | Ht 60.0 in | Wt 115.6 lb

## 2023-12-16 DIAGNOSIS — K2950 Unspecified chronic gastritis without bleeding: Secondary | ICD-10-CM | POA: Diagnosis not present

## 2023-12-16 DIAGNOSIS — R768 Other specified abnormal immunological findings in serum: Secondary | ICD-10-CM

## 2023-12-16 DIAGNOSIS — Z862 Personal history of diseases of the blood and blood-forming organs and certain disorders involving the immune mechanism: Secondary | ICD-10-CM

## 2023-12-16 DIAGNOSIS — R110 Nausea: Secondary | ICD-10-CM

## 2023-12-16 DIAGNOSIS — K2970 Gastritis, unspecified, without bleeding: Secondary | ICD-10-CM

## 2023-12-16 DIAGNOSIS — R112 Nausea with vomiting, unspecified: Secondary | ICD-10-CM

## 2023-12-16 MED ORDER — PROMETHAZINE HCL 25 MG PO TABS: 25.0000 mg | ORAL_TABLET | Freq: Four times a day (QID) | ORAL | 1 refills | Status: DC | PRN

## 2023-12-16 NOTE — Patient Instructions (Signed)
 I am ordering a gastric emptying study for you to evaluate for gastroparesis.  I also want to retest your gluten labs to see if they have increased or decreased at all.  Please continue taking your pantoprazole 40 mg once daily.  Please also avoid lactose if this worsens your symptoms.  I have refilled your Phenergan for you.  Follow-up in 3 months.  I will be in touch when I get results from your gastric emptying study.  It was a pleasure to see you today. I want to create trusting relationships with patients. If you receive a survey regarding your visit,  I greatly appreciate you taking time to fill this out on paper or through your MyChart. I value your feedback.  Julian Obey, MSN, FNP-BC, AGACNP-BC [name]

## 2023-12-17 NOTE — Telephone Encounter (Signed)
 Spoke with pt and she is unable to get into mychart.  Gave appt date and time of GES. She is unable to do this date due to work. Gave number to nuc med to call and reschedule.

## 2023-12-19 ENCOUNTER — Telehealth (HOSPITAL_COMMUNITY): Payer: Self-pay

## 2023-12-19 NOTE — Telephone Encounter (Signed)
 12/23/23 appt confirmed

## 2023-12-20 ENCOUNTER — Encounter: Payer: Self-pay | Admitting: Family Medicine

## 2023-12-23 ENCOUNTER — Ambulatory Visit (INDEPENDENT_AMBULATORY_CARE_PROVIDER_SITE_OTHER): Payer: Medicaid Other | Admitting: Psychiatry

## 2023-12-23 ENCOUNTER — Encounter (HOSPITAL_COMMUNITY): Payer: Self-pay | Admitting: Psychiatry

## 2023-12-23 DIAGNOSIS — F411 Generalized anxiety disorder: Secondary | ICD-10-CM
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
## 2023-12-25 ENCOUNTER — Other Ambulatory Visit (HOSPITAL_COMMUNITY)

## 2024-01-01 ENCOUNTER — Encounter (HOSPITAL_COMMUNITY)

## 2024-01-07 ENCOUNTER — Ambulatory Visit (INDEPENDENT_AMBULATORY_CARE_PROVIDER_SITE_OTHER): Admitting: Psychiatry

## 2024-01-07 DIAGNOSIS — F331 Major depressive disorder, recurrent, moderate: Secondary | ICD-10-CM

## 2024-01-07 DIAGNOSIS — F411 Generalized anxiety disorder: Secondary | ICD-10-CM

## 2024-01-07 NOTE — Progress Notes (Signed)
 IN-PERSON  THERAPIST PROGRESS NOTE  Session Time: Tuesday 01/07/2024 8:15 AM - 9:00 AM   Participation Level: Active  Behavioral Response: CasualAlertAnxious  Type of Therapy: Individual Therapy  Treatment Goals addressed: Establish therapeutic alliance, learn and implement relaxation techniques  ProgressTowards Goals: Formal treatment plan will be developed next session  Interventions: CBT and Supportive  Summary: Roberta Baker is a 38 y.o. female who referred for services by PCP Dr. Debrah Fan.  She denies any psychiatric hospitalizations.  She has participated in counseling through various churches. Patient states"My anxiety and depression are to the point I can't handle, I started needed something for anxiety when I was on adderall, had anxiety attack while on adderall, was in college at that time, I started needed something for depression after the birth of my second child 2 years ago".  Reports having difficulty focusing on 1 thing and being still.  Per her report she cannot have a conversation without her mind wandering.  Other symptoms include fluctuations change in energy/activity level, fatigue, hopelessness, irritability, sleep difficulty, muscle tension, worrying, and panic attacks.  Patient last was seen 2 weeks ago for the assessment appointment.  She denies any symptoms of depression but reports continued severe symptoms of anxiety as reflected in the GAD-7.  She reports medication compliance and takes Xanax  as well as Cymbalta .  She reports heavy caffeine use drinking at least 6 12 ounce cans of soda per day.  However, patient states caffeine is not affecting her anxiety level as she has always consumed large amounts of soda.  She reports continued occasional marijuana use and reports last using 3 days ago.  Patient states taking a couple of hits.  Patient reports having panic attacks daily.  She reports constant thoughts and worry about something happening to her children.  She  states worrying anytime she is separated from her children.  She also reports worry that her children's father will abandon them and states she is determined this will not happen as she grew up without her father.  Patient also worries about a variety of other issues including losing her job and her own health as she has been suffering from GI issues for several weeks but doctors have not determined a cause.  Per report, she had a seizure while walking with her children and a friend in the park after last visit triggered by her medication.  CPS was contacted by police as patient did not readily recognize her children after the seizure per patient's report.  However, CPS did not substantiate any issues per patient's report.  Suicidal/Homicidal: Nowithout intent/plan  Therapist Response: Reviewed symptoms, gathered more information from patient, discussed stressors, facilitated expression of thoughts and feelings, validated feelings, discussed caffeine use and possible effects on patient's level of anxiety, began to provide psychoeducation on anxiety and the stress response, discussed rationale for and provided patient with instructions on how to use beach visualization to trigger a relaxation response, developed plan with patient to practice beach visualization, checked out interactive audio activity to patient and provided with access code to assist her in her efforts, also discussed rationale for and developed plan with patient to complete therapy goals worksheet in preparation for next session  Plan: Return again in 2 weeks.  Diagnosis: GAD (generalized anxiety disorder)  Major depressive disorder, recurrent episode, moderate (HCC)  Collaboration of Care: Primary Care Provider AEB patient sees PCP Dr. Debrah Fan for medication management.  Patient/Guardian was advised Release of Information must be obtained prior to any record  release in order to collaborate their care with an outside provider.  Patient/Guardian was advised if they have not already done so to contact the registration department to sign all necessary forms in order for us  to release information regarding their care.   Consent: Patient/Guardian gives verbal consent for treatment and assignment of benefits for services provided during this visit. Patient/Guardian expressed understanding and agreed to proceed.   Dicie Foster, LCSW 01/07/2024

## 2024-01-09 ENCOUNTER — Telehealth: Payer: Self-pay

## 2024-01-09 ENCOUNTER — Encounter: Payer: Self-pay | Admitting: Family Medicine

## 2024-01-09 NOTE — Telephone Encounter (Signed)
 Please advise  Copied from CRM [id number]. Topic: Clinical - Medication Question >> Jan 09, 2024  3:05 PM Santiya F wrote: Reason for CRM: Patient is calling in because the pharmacy is out of ALPRAZolam  (XANAX ) 1 MG tablet [[id number]] but they have the 0.5 tablets and are willing to fill it, but they would need the prescription updated to 180 tablets. The pharmacy says if the doctor has any questions to call them. Pharmacy: [facility] [address]

## 2024-01-10 ENCOUNTER — Other Ambulatory Visit: Payer: Self-pay | Admitting: Family Medicine

## 2024-01-10 ENCOUNTER — Telehealth: Payer: Self-pay | Admitting: *Deleted

## 2024-01-10 ENCOUNTER — Encounter: Payer: Self-pay | Admitting: *Deleted

## 2024-01-10 NOTE — Telephone Encounter (Signed)
 Copied from CRM [id number]. Topic: Clinical - Medication Question >> Jan 09, 2024  3:05 PM Santiya F wrote: Reason for CRM: Patient is calling in because the pharmacy is out of ALPRAZolam  (XANAX ) 1 MG tablet [[id number]] but they have the 0.5 tablets and are willing to fill it, but they would need the prescription updated to 180 tablets. The pharmacy says if the doctor has any questions to call them. Pharmacy: [facility] [address]

## 2024-01-10 NOTE — Telephone Encounter (Signed)
 Patient notified via mychart

## 2024-01-12 ENCOUNTER — Other Ambulatory Visit: Payer: Self-pay | Admitting: Family Medicine

## 2024-01-12 DIAGNOSIS — F418 Other specified anxiety disorders: Secondary | ICD-10-CM

## 2024-01-12 MED ORDER — ALPRAZOLAM 0.5 MG PO TABS: 1.0000 mg | ORAL_TABLET | Freq: Two times a day (BID) | ORAL | 1 refills | Status: DC | PRN

## 2024-01-22 ENCOUNTER — Ambulatory Visit (HOSPITAL_COMMUNITY)
Admission: RE | Admit: 2024-01-22 | Discharge: 2024-01-22 | Disposition: A | Discharge status: Home / Self Care | Source: Ambulatory Visit | Attending: Gastroenterology | Admitting: Gastroenterology

## 2024-01-22 DIAGNOSIS — R112 Nausea with vomiting, unspecified: Secondary | ICD-10-CM | POA: Insufficient documentation

## 2024-02-03 ENCOUNTER — Ambulatory Visit: Admitting: Family Medicine

## 2024-02-03 VITALS — BP 97/60 | HR 100 | Temp 98.1°F | Ht 60.0 in | Wt 108.0 lb

## 2024-02-03 DIAGNOSIS — R55 Syncope and collapse: Secondary | ICD-10-CM | POA: Insufficient documentation

## 2024-02-03 DIAGNOSIS — R569 Unspecified convulsions: Secondary | ICD-10-CM | POA: Diagnosis not present

## 2024-02-03 NOTE — Assessment & Plan Note (Signed)
 Etiology and prognosis unclear at this time. Syncope vs seizure. Referring to neurology and cardiology.
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
## 2024-02-03 NOTE — Patient Instructions (Signed)
 Referrals placed.  No driving.   I will work on [employer]

## 2024-02-04 ENCOUNTER — Other Ambulatory Visit: Payer: Self-pay | Admitting: Gastroenterology

## 2024-02-04 ENCOUNTER — Encounter (HOSPITAL_COMMUNITY): Payer: Self-pay

## 2024-02-04 ENCOUNTER — Telehealth: Payer: Self-pay

## 2024-02-04 ENCOUNTER — Encounter (HOSPITAL_COMMUNITY)
Admission: RE | Admit: 2024-02-04 | Discharge: 2024-02-04 | Disposition: A | Discharge status: Home / Self Care | Source: Ambulatory Visit | Attending: Gastroenterology | Admitting: Gastroenterology

## 2024-02-04 DIAGNOSIS — R112 Nausea with vomiting, unspecified: Secondary | ICD-10-CM | POA: Insufficient documentation

## 2024-02-04 MED ORDER — TECHNETIUM TC 99M SULFUR COLLOID
2.0000 | Freq: Once | INTRAVENOUS | Status: AC | PRN
  Administered 2024-02-04: 2.2 via ORAL

## 2024-02-04 MED ORDER — PROMETHAZINE HCL 25 MG PO TABS
25.0000 mg | ORAL_TABLET | Freq: Four times a day (QID) | ORAL | 1 refills | Status: DC | PRN
Start: 2024-02-04 — End: 2024-03-19

## 2024-02-04 NOTE — Telephone Encounter (Signed)
 Pt LM on answering machine that she is needing a refill on her promthazine. Please advise

## 2024-02-05 ENCOUNTER — Ambulatory Visit: Payer: Self-pay | Admitting: Gastroenterology

## 2024-02-10 ENCOUNTER — Ambulatory Visit: Admitting: Diagnostic Neuroimaging

## 2024-02-10 ENCOUNTER — Ambulatory Visit: Admitting: Family Medicine

## 2024-02-10 ENCOUNTER — Telehealth: Payer: Self-pay | Admitting: Diagnostic Neuroimaging

## 2024-02-10 NOTE — Telephone Encounter (Signed)
 request to cancel appointment, car broke down

## 2024-02-11 ENCOUNTER — Ambulatory Visit (INDEPENDENT_AMBULATORY_CARE_PROVIDER_SITE_OTHER): Admitting: Psychiatry

## 2024-02-11 DIAGNOSIS — F331 Major depressive disorder, recurrent, moderate: Secondary | ICD-10-CM

## 2024-02-11 DIAGNOSIS — F411 Generalized anxiety disorder: Secondary | ICD-10-CM
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
## 2024-02-13 ENCOUNTER — Ambulatory Visit: Admitting: Neurology

## 2024-02-13 ENCOUNTER — Encounter: Payer: Self-pay | Admitting: Neurology

## 2024-02-13 VITALS — BP 111/74 | HR 107 | Ht 60.0 in | Wt 106.5 lb

## 2024-02-13 DIAGNOSIS — F418 Other specified anxiety disorders: Secondary | ICD-10-CM

## 2024-02-13 DIAGNOSIS — R569 Unspecified convulsions: Secondary | ICD-10-CM | POA: Diagnosis not present

## 2024-02-13 MED ORDER — LAMOTRIGINE 100 MG PO TABS: ORAL_TABLET | ORAL | 11 refills | Status: DC

## 2024-02-13 NOTE — Progress Notes (Addendum)
 [facility]  PATIENT: Roberta Baker DOB: 05-18-1986  REFERRING DOCTOR OR PCP: Jayce Cook, DO SOURCE: Patient, notes from primary care  _________________________________   HISTORICAL  CHIEF COMPLAINT:  Chief Complaint  Patient presents with   RM10/SEIZURES    Pt is here Alone. Pt states that she has never been diagnosed with Seizures, but has a family history of seizures (Mother). Pt states that she has her first seizure was 32-95 years old. Pt states that hr last Seizure was last Monday.  Pt states that she had a seizure the day after Mother's Day, pt states that she was going through stress.     HISTORY OF PRESENT ILLNESS:  I had the pleasure of seeing your patient, Roberta Baker, at [facility] for neurologic consultation regarding her seizures.  She is a 38 year old woman who had two recent spells, one in May 2025 and another in early June 2025.    The May 2025 episode occurred while wlking with her kids to the park.  She recalls feeling hungry and commenting to her friend who witnessed the episode.  She collapsed and then fell to  the ground and then a generalized seizure with shaking.x 1 minute.   Then she was unresponsive  x 30 minutes and very quickly retunred back to baseline.  EMS was called and she was taken to the ambulance but since she recovered, she was not taken to the ED.  The June 2025 spell was also  witnessed.   She opened the door for [employer] and just seemed to pass out.  There was no Tonic Clonic activity and she was unresponsive for several minutes and very quickly came back to baselie.  She had another event at age 38 witnessed by her mother with unresponsiveness and shaking x 1 minute followed by being groggy afterwards.   At the time, several medication changes had just been made.  Clonazepam  was abruptly discontinued.  At the hospital she had a CT of the head that was normal (except small right sphenoid  mucocele).    I personally reviewed the images and concur  Of note, she is on phenergan  25 mg qid (x 7 years since frst child).   She tried Zofran  but it caused headaches.   She has anxiety and depression and is on   She has a family history of seizures (mother).  The patient had normal early childhood development.  No history of major head trauma or infection  REVIEW OF SYSTEMS: Constitutional: No fevers, chills, sweats, or change in appetite Eyes: No visual changes, double vision, eye pain Ear, nose and throat: No hearing loss, ear pain, nasal congestion, sore throat Cardiovascular: No chest pain, palpitations Respiratory:  No shortness of breath at rest or with exertion.   No wheezes GastrointestinaI: No nausea, vomiting, diarrhea, abdominal pain, fecal incontinence Genitourinary:  No dysuria, urinary retention or frequency.  No nocturia. Musculoskeletal:  No neck pain, back pain Integumentary: No rash, pruritus, skin lesions Neurological: as above Psychiatric: No depression at this time.  No anxiety Endocrine: No palpitations, diaphoresis, change in appetite, change in weigh or increased thirst Hematologic/Lymphatic:  No anemia, purpura, petechiae. Allergic/Immunologic: No itchy/runny eyes, nasal congestion, recent allergic reactions, rashes  ALLERGIES: Allergies  Allergen Reactions   Buspar  [Buspirone ] Other (See Comments)    Caused nightmares/hallucinations   Sulfa Antibiotics Nausea And Vomiting    HOME MEDICATIONS:  Current Outpatient Medications:    ALPRAZolam  (XANAX ) 0.5 MG tablet, Take 2 tablets (  1 mg total) by mouth 2 (two) times daily as needed for anxiety., Disp: 120 tablet, Rfl: 1   diphenhydrAMINE  (BENADRYL ) 25 mg capsule, Take 25 mg by mouth every 6 (six) hours as needed., Disp: , Rfl:    DULoxetine  (CYMBALTA ) 60 MG capsule, Take 1 capsule (60 mg total) by mouth daily., Disp: 90 capsule, Rfl: 3   pantoprazole  (PROTONIX ) 40 MG tablet, Take 1 tablet (40 mg total) by  mouth daily., Disp: 30 tablet, Rfl: 11   promethazine  (PHENERGAN ) 25 MG tablet, Take 1 tablet (25 mg total) by mouth every 6 (six) hours as needed for nausea or vomiting., Disp: 120 tablet, Rfl: 1  PAST MEDICAL HISTORY: 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    PAST SURGICAL HISTORY: Past Surgical History:  Procedure Laterality Date   BIOPSY  05/30/2023   Procedure: BIOPSY;  Surgeon: Vinetta Greening, DO;  Location: [facility];  Service: Endoscopy;;   BREAST ENHANCEMENT SURGERY     BREAST SURGERY     ESOPHAGOGASTRODUODENOSCOPY (EGD) WITH PROPOFOL  N/A 05/30/2023   Procedure: ESOPHAGOGASTRODUODENOSCOPY (EGD) WITH PROPOFOL ;  Surgeon: Vinetta Greening, DO;  Location: [facility];  Service: Endoscopy;  Laterality: N/A;  12:00pm, asa 2   LAPAROSCOPIC BILATERAL SALPINGECTOMY Bilateral 01/16/2022   Procedure: LAPAROSCOPIC BILATERAL SALPINGECTOMY;  Surgeon: Ozan, Jennifer, DO;  Location: [facility];  Service: Gynecology;  Laterality: Bilateral;   LEEP N/A 01/16/2022   Procedure: LOOP ELECTROSURGICAL EXCISION PROCEDURE (LEEP);  Surgeon: Ozan, Jennifer, DO;  Location: [facility];  Service: Gynecology;  Laterality: N/A;   TUBAL LIGATION      FAMILY HISTORY: Family History  Problem Relation Age of Onset   Bipolar disorder Mother    Mental illness Mother    Seizures Mother    Cancer Maternal Uncle    Heart attack Maternal Grandfather    Cancer Maternal Grandmother        breast cancer    SOCIAL HISTORY: Social History   Socioeconomic History   Marital status: Single    Spouse name: Not on file   Number of children: 1   Years of education: Not on file   Highest education level: Some college, no degree  Occupational History    Not on file  Tobacco Use   Smoking status: Every Day    Current packs/day: 0.75    Average packs/day: 0.8 packs/day for 0.5 years (0.4 ttl pk-yrs)    Types: Cigarettes   Smokeless tobacco: Never  Vaping Use   Vaping status: Never Used  Substance and Sexual Activity   Alcohol use: No   Drug use: Yes    Types: Marijuana    Comment: occasional   Sexual activity: Not Currently    Birth control/protection: Surgical    Comment: tubal  Other Topics Concern   Not on file  Social History Narrative   Not on file   Social Drivers of Health   Financial Resource Strain: Low Risk  (02/08/2023)   Overall Financial Resource Strain (CARDIA)    Difficulty of Paying Living Expenses: Not hard at all  Food Insecurity: No Food Insecurity (02/08/2023)   Hunger Vital Sign  Worried About Programme researcher, broadcasting/film/video in the Last Year: Never true    Ran Out of Food in the Last Year: Never true  Transportation Needs: No Transportation Needs (02/08/2023)   PRAPARE - Administrator, Civil Service (Medical): No    Lack of Transportation (Non-Medical): No  Physical Activity: Insufficiently Active (02/08/2023)   Exercise Vital Sign    Days of Exercise per Week: 4 days    Minutes of Exercise per Session: 30 min  Stress: Stress Concern Present (02/08/2023)   [employer] of Occupational Health - Occupational Stress Questionnaire    Feeling of Stress : Rather much  Social Connections: Moderately Isolated (06/02/2021)   Social Connection and Isolation Panel    Frequency of Communication with Friends and Family: More than three times a week    Frequency of Social Gatherings with Friends and Family: More than three times a week    Attends Religious Services: Never    Database administrator or Organizations: No    Attends Banker Meetings: Never    Marital Status: Living with partner  Intimate Partner Violence: Not At Risk (06/02/2021)   Humiliation, Afraid, Rape, and Kick questionnaire    Fear  of Current or Ex-Partner: No    Emotionally Abused: No    Physically Abused: No    Sexually Abused: No       PHYSICAL EXAM  Vitals:   02/13/24 0811  BP: 111/74  Pulse: (!) 107  SpO2: 98%  Weight: 106 lb 8 oz (48.3 kg)  Height: 5' (1.524 m)    Body mass index is 20.8 kg/m.   General: The patient is well-developed and well-nourished and in no acute distress  HEENT:  Head is [place]/AT.  Sclera are anicteric.    Neck: No carotid bruits are noted.  The neck is nontender.  Cardiovascular: The heart has a regular rate and rhythm with a normal S1 and S2. There were no murmurs, gallops or rubs.    Skin: Extremities are without rash or  edema.  Musculoskeletal:  Back is nontender  Neurologic Exam  Mental status: The patient is alert and oriented x 3 at the time of the examination. The patient has apparent normal recent and remote memory, with an apparently normal attention span and concentration ability.   Speech is normal.  Cranial nerves: Extraocular movements are full. Pupils are equal, round, and reactive to light and accomodation.  Oral. There is good facial sensation to soft touch bilaterally.Facial strength is normal.  Trapezius and sternocleidomastoid strength is normal. No dysarthria is noted.  The tongue is midline, and the patient has symmetric elevation of the soft palate. No obvious hearing deficits are noted.  Motor:  Muscle bulk is normal.   Tone is normal. Strength is  5 / 5 in all 4 extremities.   Sensory: Sensory testing is intact to pinprick, soft touch and vibration sensation in all 4 extremities.  Coordination: Cerebellar testing reveals good finger-nose-finger and heel-to-shin bilaterally.  Gait and station: Station is normal.   Gait is normal. Tandem gait is normal. Romberg is negative.   Reflexes: Deep tendon reflexes are symmetric and normal bilaterally.   Plantar responses are flexor.    DIAGNOSTIC DATA (LABS, IMAGING, TESTING) - I reviewed patient  records, labs, notes, testing and imaging myself where available.  Lab Results  Component Value Date   WBC 7.7 01/12/2022   HGB 11.4 (L) 01/12/2022   HCT 35.0 (L) 01/12/2022   MCV 85.4  01/12/2022   PLT 404 (H) 01/12/2022      Component Value Date/Time   NA 140 04/22/2023 0818   K 3.9 04/22/2023 0818   CL 104 04/22/2023 0818   CO2 24 04/22/2023 0818   GLUCOSE 97 04/22/2023 0818   GLUCOSE 92 10/10/2020 2034   BUN 6 04/22/2023 0818   CREATININE 0.76 04/22/2023 0818   CALCIUM 9.0 04/22/2023 0818   PROT 6.1 04/22/2023 0818   ALBUMIN 4.1 04/22/2023 0818   AST 16 04/22/2023 0818   ALT 11 04/22/2023 0818   ALKPHOS 67 04/22/2023 0818   BILITOT <0.2 04/22/2023 0818   GFRNONAA >60 10/25/2018 0950   GFRAA >60 10/25/2018 0950   Lab Results  Component Value Date   CHOL 214 (H) 11/08/2021   HDL 36 (L) 11/08/2021   LDLCALC 153 (H) 11/08/2021   TRIG 138 11/08/2021   CHOLHDL 5.9 (H) 11/08/2021   No results found for: HGBA1C No results found for: VITAMINB12 Lab Results  Component Value Date   TSH 0.771 11/08/2021       ASSESSMENT AND PLAN  Seizures (HCC) - Plan: EEG adult  Depression with anxiety   In summary, Ms. Devall is a 38 year old woman who reports 2 spells over the last month with loss of consciousness, the first 1 more consistent with a generalized seizure and the second 1 more consistent with syncope.  Additionally, about 10 years ago, possibly related to clonazepam  withdrawal, she had another witnessed seizure.  We will check an EEG.  If there is any lateralizing finding, consider MRI.  Because she has had at least 2 seizures, I do recommend that we begin a medication.  She also has significant anxiety.  I will place her on lamotrigine in the hope that this could help both of her symptoms.  We also discussed that Phenergan  can reduce the seizure threshold.  Unfortunately, she was unable to tolerate ondansetron  that would have offered an alternative.  She will  return to see me in 1 year or sooner if there are new or worsening neurologic symptoms.  She is advised to call if she has another seizure while on the medication.  Thank you for asking me to see this patient.  Please let me know if I can be of further assistance with her other patients in the future.   Antanasia Kaczynski A. Godwin Lat, MD, [facility] 02/13/2024, 9:03 AM Certified in Neurology, Clinical Neurophysiology, Sleep Medicine and Neuroimaging  [name] [address], [address] [place], [place] [address] [phone]

## 2024-02-13 NOTE — Patient Instructions (Signed)
 Lamotrigine 100 mg For 6 days, take 1/2 pill daily For next 6 days, take 1/2 pill twice a day For next 6 days, take 1/2 pill in morning and 1 pill at night Then take 1 pill twice a day

## 2024-02-18 ENCOUNTER — Ambulatory Visit: Admitting: Family Medicine

## 2024-02-18 VITALS — BP 118/74 | HR 137 | Temp 98.6°F | Ht 60.0 in | Wt 106.6 lb

## 2024-02-18 DIAGNOSIS — R569 Unspecified convulsions: Secondary | ICD-10-CM

## 2024-02-18 DIAGNOSIS — F418 Other specified anxiety disorders: Secondary | ICD-10-CM

## 2024-02-18 NOTE — Assessment & Plan Note (Signed)
 Uncontrolled.  I do not recommend increase in alprazolam .  Continue Cymbalta .  Discussed that there are additional medication options.  Patient will consider.

## 2024-02-18 NOTE — Assessment & Plan Note (Signed)
 Note from neurology reviewed.  Awaiting EEG.

## 2024-02-18 NOTE — Patient Instructions (Signed)
 Continue your medications.  Follow up in 6 months.  Take care  Dr. Adriana Simas

## 2024-02-18 NOTE — Progress Notes (Signed)
 Subjective:  Patient ID: Roberta Baker, female    DOB: 02-12-1986  Age: 38 y.o. MRN: [id number]  CC:  Follow up   HPI:  38 year old female presents for follow-up.  Patient recently seen by neurology.  Placed on Lamictal .  Has upcoming EEG.  Patient reports that she has significant anxiety which seems to be worsening.  She is seeing a therapist.  Currently on Cymbalta  and alprazolam .  Patient is inquiring about increasing alprazolam .  Will discuss today.  Patient previously gave me FMLA form to fill out regarding intermittent FMLA for seizure.  Form has been filled out.  Will give to the patient today.  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/12/2016   Depression with anxiety 12/12/2016    Social Hx   Social History   Socioeconomic History   Marital status: Single    Spouse name: Not on file   Number of children: 1   Years of education: Not on file   Highest education level: Some college, no degree  Occupational History   Not on file  Tobacco Use   Smoking status: Every Day    Current packs/day: 0.75    Average packs/day: 0.8 packs/day for 0.5 years (0.4 ttl pk-yrs)    Types: Cigarettes   Smokeless tobacco: Never  Vaping Use   Vaping status: Never Used  Substance and Sexual Activity   Alcohol use: No   Drug use: Yes    Types: Marijuana    Comment: occasional   Sexual activity: Not Currently    Birth control/protection: Surgical    Comment: tubal  Other Topics Concern   Not on file  Social History Narrative   Not on file   Social Drivers of Health   Financial Resource Strain: Low Risk  (02/08/2023)   Overall Financial Resource Strain (CARDIA)    Difficulty of Paying Living Expenses: Not hard at all  Food Insecurity: No Food Insecurity  (02/08/2023)   Hunger Vital Sign    Worried About Running Out of Food in the Last Year: Never true    Ran Out of Food in the Last Year: Never true  Transportation Needs: No Transportation Needs (02/08/2023)   PRAPARE - Administrator, Civil Service (Medical): No    Lack of Transportation (Non-Medical): No  Physical Activity: Insufficiently Active (02/08/2023)   Exercise Vital Sign    Days of Exercise per Week: 4 days    Minutes of Exercise per Session: 30 min  Stress: Stress Concern Present (02/08/2023)   [employer] of Occupational Health - Occupational Stress Questionnaire    Feeling of Stress : Rather much  Social Connections: Moderately Isolated (06/02/2021)   Social Connection and Isolation Panel    Frequency of Communication with Friends and Family: More than three times a week    Frequency of Social Gatherings with Friends and Family: More than three times a week    Attends Religious Services: Never    Database administrator or Organizations: No    Attends Banker Meetings: Never    Marital Status: Living with partner    Review of Systems Per HPI  Objective:  BP 118/74   Pulse (!) 137   Temp 98.6 F (37 C)   Ht 5' (1.524 m)  Wt 106 lb 9.6 oz (48.4 kg)   LMP 01/06/2024 (Within Days) Comment: Tubal Ligation  SpO2 96%   BMI 20.82 kg/m      02/18/2024    9:03 AM 02/13/2024    8:11 AM 02/03/2024    1:18 PM  BP/Weight  Systolic BP 118 111 97  Diastolic BP 74 74 60  Wt. (Lbs) 106.6 106.5 108  BMI 20.82 kg/m2 20.8 kg/m2 21.09 kg/m2    Physical Exam Vitals and nursing note reviewed.  Constitutional:      General: She is not in acute distress.    Appearance: Normal appearance.  HENT:     Head: Normocephalic and atraumatic.   Cardiovascular:     Rate and Rhythm: Regular rhythm. Tachycardia present.  Pulmonary:     Effort: Pulmonary effort is normal.     Breath sounds: Normal breath sounds. No wheezing or rales.   Neurological:      Mental Status: She is alert.     Lab Results  Component Value Date   WBC 7.7 01/12/2022   HGB 11.4 (L) 01/12/2022   HCT 35.0 (L) 01/12/2022   PLT 404 (H) 01/12/2022   GLUCOSE 97 04/22/2023   CHOL 214 (H) 11/08/2021   TRIG 138 11/08/2021   HDL 36 (L) 11/08/2021   LDLCALC 153 (H) 11/08/2021   ALT 11 04/22/2023   AST 16 04/22/2023   NA 140 04/22/2023   K 3.9 04/22/2023   CL 104 04/22/2023   CREATININE 0.76 04/22/2023   BUN 6 04/22/2023   CO2 24 04/22/2023   TSH 0.771 11/08/2021   INR 0.98 10/25/2018     Assessment & Plan:  Seizure-like activity [facility]) Assessment & Plan: Note from neurology reviewed.  Awaiting EEG.   Depression with anxiety Assessment & Plan: Uncontrolled.  I do not recommend increase in alprazolam .  Continue Cymbalta .  Discussed that there are additional medication options.  Patient will consider.     Follow-up: 6 months  Deziray Nabi Debrah Fan DO [facility]

## 2024-02-20 ENCOUNTER — Other Ambulatory Visit: Payer: Self-pay | Admitting: Family Medicine

## 2024-02-20 ENCOUNTER — Telehealth: Payer: Self-pay | Admitting: *Deleted

## 2024-02-20 DIAGNOSIS — F418 Other specified anxiety disorders: Secondary | ICD-10-CM

## 2024-02-20 MED ORDER — ALPRAZOLAM 0.5 MG PO TABS: ORAL_TABLET | ORAL | 0 refills | Status: DC

## 2024-02-20 NOTE — Telephone Encounter (Signed)
 Patient stated she was filling her med container and dropped her entire bottles of Alprazolam  and phenergan  down the heating an air vent- Patient states she took her last 2 Alprazolam  this am  Advised patient she would need to contact GI via Phenergan  as they prescribe and manage that medication. Advised patient that Alprazolam  is a controlled medication and is unlikely it would be able to be refilled so early as it was last filled 02/10/24 for #120

## 2024-02-20 NOTE — Telephone Encounter (Signed)
 Copied from CRM [id number]. Topic: Clinical - Prescription Issue >> Feb 20, 2024  9:06 AM Ivette P wrote: Reason for CRM: Pt called in because she dropped half of her medication down the drain. Pt is really freaked out and is un sure what her optoin are.   Pt would like a nurse to follow up because pt does not have enough medication to cover her until next refill.   ALPRAZolam  (XANAX ) 0.5 MG tablet    Pls follow up with pt regarding prescriptions, [phone]

## 2024-02-20 NOTE — Telephone Encounter (Signed)
 Patient would like clonidine sent to [facility] on [address]

## 2024-02-21 ENCOUNTER — Encounter: Payer: Self-pay | Admitting: Neurology

## 2024-02-21 ENCOUNTER — Other Ambulatory Visit: Payer: Self-pay

## 2024-02-21 DIAGNOSIS — F418 Other specified anxiety disorders: Secondary | ICD-10-CM

## 2024-02-24 ENCOUNTER — Encounter: Payer: Self-pay | Admitting: Family Medicine

## 2024-02-24 ENCOUNTER — Ambulatory Visit: Payer: Self-pay

## 2024-02-24 ENCOUNTER — Ambulatory Visit: Admitting: Family Medicine

## 2024-02-24 VITALS — BP 108/62 | HR 123 | Temp 97.6°F | Ht 60.0 in | Wt 105.0 lb

## 2024-02-24 DIAGNOSIS — F419 Anxiety disorder, unspecified: Secondary | ICD-10-CM

## 2024-02-24 MED ORDER — HYDROXYZINE PAMOATE 25 MG PO CAPS: 25.0000 mg | ORAL_CAPSULE | Freq: Three times a day (TID) | ORAL | 1 refills | Status: DC | PRN

## 2024-02-24 MED ORDER — LEVETIRACETAM 500 MG PO TABS: 500.0000 mg | ORAL_TABLET | Freq: Two times a day (BID) | ORAL | 11 refills | Status: AC

## 2024-02-24 NOTE — Telephone Encounter (Signed)
 FYI Only or Action Required?: FYI only for provider.  Patient was last seen in primary care on 02/18/2024 by Cook, Jayce G, DO. Called Nurse Triage reporting Medication Problem. Symptoms began yesterday. Interventions attempted: Nothing. Symptoms are: gradually worsening.  Triage Disposition: See Physician Within 24 Hours  Patient/caregiver understands and will follow disposition?: Yes     Copied from CRM [id number]. Topic: Clinical - Red Word Triage >> Feb 24, 2024  8:05 AM Roberta Baker wrote: Red Word that prompted transfer to Nurse Triage: patient flushed her meds, its giving her withdrawals, can't sleep and eat once she stops taking it. Reason for Disposition  Patient sounds very upset or troubled to the triager  Answer Assessment - Initial Assessment Questions I need something called in today because I need something to calm me down. It doesn't have to be controlled substances. I also keep losing weight Patient also needs to discuss medication reconciliation with PCP because she is unsure if she discontinued the incorrect medication. She will be bringing her medications with her.   1. CONCERN: Did anything happen that prompted you to call today?      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 ANXIETY SYMPTOMS: Can you describe how you (your loved one; patient) have been feeling? (e.g., tense, restless, panicky, anxious, keyed up, overwhelmed, sense of impending doom).      A feeling of shaking and heart racing, like adrenaline going 3. ONSET: How long have you been feeling this way? (e.g., hours, days, weeks)     Last night when she got home from work - she flushed her xanax  because she was mad at herself for why she needed to take it 4. SEVERITY: How would you rate the level of anxiety? (e.g., 0 - 10; or mild, moderate, severe).     Patient states she is feeling very worked up and needs something called in asap 5. FUNCTIONAL IMPAIRMENT: How have these feelings affected  your ability to do daily activities? Have you had more difficulty than usual doing your normal daily activities? (e.g., getting better, same, worse; self-care, school, work, interactions)     Patient states she is going to therapist and her therapist states she has extreme anxiety 6. HISTORY: Have you felt this way before? Have you ever been diagnosed with an anxiety problem in the past? (e.g., generalized anxiety disorder, panic attacks, PTSD). If Yes, ask: How was this problem treated? (e.g., medicines, counseling, etc.)     Yes - patient has had anxiety attacks in the past even back to college 7. RISK OF HARM - SUICIDAL IDEATION: Do you ever have thoughts of hurting or killing yourself? If Yes, ask:  Do you have these feelings now? Do you have a plan on how you would do this?     No I would be too scared to 8. TREATMENT:  What has been done so far to treat this anxiety? (e.g., medicines, relaxation strategies). What has helped?     Xanax  - currently also on Cymbalta  9. TREATMENT - THERAPIST: Do you have a counselor or therapist? Name?     Yes 10. POTENTIAL TRIGGERS: Do you drink caffeinated beverages (e.g., coffee, colas, teas), and how much daily? Do you drink alcohol or use any drugs? Have you started any new medicines recently?       Patient states she drinks a lot coca-cola and her therapist mentioned she should switch to caffeine free 11. PATIENT SUPPORT: Who is with you now? Who do you live  with? Do you have family or friends who you can talk to?        Patient states my children are my babies and I have great neighbors and coworkers and a great mom 12. OTHER SYMPTOMS: Do you have any other symptoms? (e.g., feeling depressed, trouble concentrating, trouble sleeping, trouble breathing, palpitations or fast heartbeat, chest pain, sweating, nausea, or diarrhea)       Fast heartbeat, feeling like something bad is going to happen  Protocols used: Anxiety  and Panic Attack-A-AH

## 2024-02-24 NOTE — Progress Notes (Signed)
 Subjective:  Patient ID: Roberta Baker, female    DOB: Dec 11, 1985  Age: 38 y.o. MRN: [id number]  CC:   Chief Complaint  Patient presents with   Anxiety    Anxiety Feels like addicted to Xanax      HPI:  38 year old female presents for evaluation of the above.  Patient is having a difficult time currently.  Severe anxiety.  She recently dropped her Xanax  down and air vent and I subsequently sent in a short supply of medication to prevent withdrawal symptoms.  Patient states that she feels like she is requiring more and more Xanax  and got frustrated and flushed her medication down the toilet.  Patient states that she is concerned that she is experiencing withdrawal and that she has to have this medication.  She is also concerned about her anxiety.  She has previously been on BuSpar  as well as clonazepam .  Is currently on Cymbalta .  Prior increase in dose resulted in worsening nausea.  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    Social Hx   Social History   Socioeconomic History   Marital status: Single    Spouse name: Not on file   Number of children: 1   Years of education: Not on file   Highest education level: Some college, no degree  Occupational History   Not on file  Tobacco Use   Smoking status: Every Day    Current packs/day: 0.75    Average packs/day: 0.8 packs/day for 0.5 years (0.4 ttl pk-yrs)    Types: Cigarettes   Smokeless tobacco: Never  Vaping Use   Vaping status: Never Used  Substance and Sexual Activity   Alcohol use: No   Drug use: Yes    Types: Marijuana    Comment: occasional   Sexual activity: Not Currently    Birth control/protection: Surgical    Comment: tubal  Other Topics Concern   Not  on file  Social History Narrative   Not on file   Social Drivers of Health   Financial Resource Strain: Low Risk  (02/08/2023)   Overall Financial Resource Strain (CARDIA)    Difficulty of Paying Living Expenses: Not hard at all  Food Insecurity: No Food Insecurity (02/08/2023)   Hunger Vital Sign    Worried About Running Out of Food in the Last Year: Never true    Ran Out of Food in the Last Year: Never true  Transportation Needs: No Transportation Needs (02/08/2023)   PRAPARE - Administrator, Civil Service (Medical): No    Lack of Transportation (Non-Medical): No  Physical Activity: Insufficiently Active (02/08/2023)   Exercise Vital Sign    Days of Exercise per Week: 4 days    Minutes of Exercise per Session: 30 min  Stress: Stress Concern Present (02/08/2023)   [employer] of Occupational Health - Occupational Stress Questionnaire    Feeling of Stress : Rather much  Social Connections: Moderately Isolated (06/02/2021)   Social Connection and Isolation Panel    Frequency of Communication with Friends and Family: More than three times a week    Frequency of Social Gatherings with Friends and Family: More than three times a week    Attends Religious Services: Never    Active  Member of Clubs or Organizations: No    Attends Banker Meetings: Never    Marital Status: Living with partner    Review of Systems Per HPI  Objective:  BP 108/62   Pulse (!) 123   Temp 97.6 F (36.4 C)   Ht 5' (1.524 m)   Wt 105 lb (47.6 kg)   LMP 01/06/2024 (Within Days) Comment: Tubal Ligation  SpO2 98%   BMI 20.51 kg/m      02/24/2024    9:54 AM 02/18/2024    9:03 AM 02/13/2024    8:11 AM  BP/Weight  Systolic BP 108 118 111  Diastolic BP 62 74 74  Wt. (Lbs) 105 106.6 106.5  BMI 20.51 kg/m2 20.82 kg/m2 20.8 kg/m2    Physical Exam Vitals and nursing note reviewed.  Constitutional:      General: She is in acute distress.  HENT:     Head: Normocephalic and  atraumatic.   Cardiovascular:     Rate and Rhythm: Regular rhythm. Tachycardia present.  Pulmonary:     Effort: Pulmonary effort is normal. No respiratory distress.   Neurological:     Mental Status: She is alert.   Psychiatric:     Comments: Anxious.  Psychomotor agitation.     Lab Results  Component Value Date   WBC 7.7 01/12/2022   HGB 11.4 (L) 01/12/2022   HCT 35.0 (L) 01/12/2022   PLT 404 (H) 01/12/2022   GLUCOSE 97 04/22/2023   CHOL 214 (H) 11/08/2021   TRIG 138 11/08/2021   HDL 36 (L) 11/08/2021   LDLCALC 153 (H) 11/08/2021   ALT 11 04/22/2023   AST 16 04/22/2023   NA 140 04/22/2023   K 3.9 04/22/2023   CL 104 04/22/2023   CREATININE 0.76 04/22/2023   BUN 6 04/22/2023   CO2 24 04/22/2023   TSH 0.771 11/08/2021   INR 0.98 10/25/2018     Assessment & Plan:  Severe anxiety Assessment & Plan: Severe, worsening. Sending to [name] - for treatment of Benzo withdrawal. Adding Atarax .   Other orders -     hydrOXYzine  Pamoate; Take 1 capsule (25 mg total) by mouth 3 (three) times daily as needed for anxiety.  Dispense: 90 capsule; Refill: 1    Follow-up:  Return in about 1 week (around 03/02/2024).  Jacqulyn Ahle DO [facility]

## 2024-02-24 NOTE — Patient Instructions (Signed)
 Medication as prescribed.  [facility] has a walk in clinic. Go as soon as you can. I am concerned about withdrawal.  Take care  Dr. Bluford

## 2024-02-24 NOTE — Telephone Encounter (Signed)
 Patient seen by provider 02/24/24

## 2024-02-24 NOTE — Assessment & Plan Note (Signed)
 Severe, worsening. Sending to [facility] - for treatment of Benzo withdrawal. Adding Atarax .

## 2024-02-24 NOTE — Telephone Encounter (Signed)
 Called pt. Saw Dr. Bluford this am (PCP). Was on xanax  for anxiety. Felt ineffective and found herself taking more than rx'd. Felt addicted. Panicked and flushed medication. PCP Referred her to outpt detox. Placed her on hydroxyzine  TID instead. She did not want a narcotic.  Explained for her to stop lamotrigine  d/t SE and start levetiracetam 500mg  po BID. E-scribed to [facility] [phone] - [place], [place] - [address] AT [name] OF [address] per pt request.   Called [facility] at [phone]. LVM asking they cx rx remaining refills on file for lamotrigine .

## 2024-02-25 ENCOUNTER — Encounter: Payer: Self-pay | Admitting: Gastroenterology

## 2024-02-25 ENCOUNTER — Ambulatory Visit (INDEPENDENT_AMBULATORY_CARE_PROVIDER_SITE_OTHER): Admitting: Psychiatry

## 2024-02-25 DIAGNOSIS — F331 Major depressive disorder, recurrent, moderate: Secondary | ICD-10-CM | POA: Diagnosis not present

## 2024-02-25 DIAGNOSIS — F411 Generalized anxiety disorder: Secondary | ICD-10-CM

## 2024-02-25 NOTE — Progress Notes (Signed)
 IN-PERSON  THERAPIST PROGRESS NOTE  Session Time: Tuesday 02/25/2024 8:16 AM -  8:55 AM   Participation Level: Active  Behavioral Response: CasualAlertAnxious/fidgety/tense/talks excessively  Type of Therapy: Individual Therapy  Treatment Goals addressed: learn and implement relaxation techniques  ProgressTowards Goals: Formal treatment plan will be developed next session  Interventions: CBT and Supportive  Summary: Roberta Baker is a 38 y.o. female who referred for services by PCP Dr. Bluford.  She denies any psychiatric hospitalizations.  She has participated in counseling through various churches. Patient statesMy anxiety and depression are to the point I can't handle, I started needed something for anxiety when I was on adderall, had anxiety attack while on adderall, was in college at that time, I started needed something for depression after the birth of my second child 2 years ago.  Reports having difficulty focusing on 1 thing and being still.  Per her report she cannot have a conversation without her mind wandering.  Other symptoms include fluctuations change in energy/activity level, fatigue, hopelessness, irritability, sleep difficulty, muscle tension, worrying, and panic attacks.  Patient last was seen about 2 weeks ago.  She reports increased stress and anxiety since last session.  Per patient's report, this is was triggered by an incident where her usual dosage of Xanax  was not helpful.  She became angry and threw the medication down the toilet as she states realizing she may be addicted to it.  Per patient's report, she notified her PCP Dr. Bluford who prescribed Vistaril  instead and referred patient to [facility].  Patient expresses anger and frustration regarding having anxiety and staying nauseated.  She reports none of her medical tests have indicated reasons for her nausea.  Suicidal/Homicidal: Nowithout intent/plan  Therapist Response: Reviewed symptoms, discussed  stressors, facilitated expression of thoughts and feelings validated feelings encouraged patient to follow through with appointments with PCP Dr. Bluford and [facility], began to orient patient to CBT, reiterated importance of practicing relaxation techniques and also working with providers regarding medication management Plan: Return again in 2 weeks.  Diagnosis: GAD (generalized anxiety disorder)  Major depressive disorder, recurrent episode, moderate (HCC)  Collaboration of Care: Primary Care Provider AEB patient sees PCP Dr. Bluford for medication management.  Patient/Guardian was advised Release of Information must be obtained prior to any record release in order to collaborate their care with an outside provider. Patient/Guardian was advised if they have not already done so to contact the registration department to sign all necessary forms in order for us  to release information regarding their care.   Consent: Patient/Guardian gives verbal consent for treatment and assignment of benefits for services provided during this visit. Patient/Guardian expressed understanding and agreed to proceed.   Winton FORBES Rubinstein, LCSW 02/25/2024

## 2024-02-27 ENCOUNTER — Telehealth: Payer: Self-pay

## 2024-02-27 ENCOUNTER — Ambulatory Visit: Admitting: Neurology

## 2024-02-27 ENCOUNTER — Encounter: Payer: Self-pay | Admitting: Neurology

## 2024-02-27 DIAGNOSIS — R569 Unspecified convulsions: Secondary | ICD-10-CM

## 2024-02-27 NOTE — Progress Notes (Signed)
   [facility]  EEG (ELECTROENCEPHALOGRAM) REPORT   STUDY DATE: 02/27/2024 PATIENT NAME: Roberta Baker DOB: Mar 22, 1986 MRN: [id number]  ORDERING CLINICIAN: Tremeka Helbling A. Vear, MD. PhD  TECHNOLOGIST: Burnard Plummer, REEGT TECHNIQUE: Electroencephalogram was recorded utilizing standard 10-20 system of lead placement and reformatted into average and bipolar montages.  RECORDING TIME: 25 minutes 51 seconds  CLINICAL INFORMATION: 38 year old woman with recent spells/seizures.  She has anxiety.  FINDINGS: A digital EEG was performed while the patient was awake and drowsy. While awake and most alert there was a 11-12 Hz posterior dominant rhythm. Voltages and frequencies were symmetric.  There were no focal, lateralizing, epileptiform activity or seizures seen.  Photic stimulation had a normal driving response. Hyperventilation and recovery did not change the underlying rhythms. EKG channel shows normal sinus rhythm.  The patient remained awake throughout the procedure.  She was anxious during the test and cried at 1 point..  IMPRESSION: This is a normal EEG while the patient was awake.   INTERPRETING PHYSICIAN:   Shiane Wenberg A. Vear, MD, PhD, [facility] Certified in Neurology, Clinical Neurophysiology, Sleep Medicine, Pain Medicine and Neuroimaging  [facility] [address] [place], [place] [address] [phone]

## 2024-02-27 NOTE — Telephone Encounter (Signed)
 Communication  Reason for CRM: Patient states that her vistaril  is not working for her, she doesn't want more zanax as it makes her worse. She wants to know if Dr Bluford would be able to prescribe her Klonopin  for her anxiety. She states she used it in the past, she said it did it's job she took it once a day and it worked perfectly, never caused any issues. She states she has done everything he has recommended to try, she said she has not slept since Tuesday and has not been able to east well. Patient refused nurse triage but states she is a ball of energy and cannot sit still or sleep. She wants Dr Bluford to know she has went to all of her appointments and she just had her EEG.

## 2024-02-28 ENCOUNTER — Telehealth: Payer: Self-pay

## 2024-02-28 ENCOUNTER — Other Ambulatory Visit: Payer: Self-pay

## 2024-02-28 DIAGNOSIS — F419 Anxiety disorder, unspecified: Secondary | ICD-10-CM

## 2024-02-28 NOTE — Telephone Encounter (Signed)
 CommunicationReason for CRM: The patient has called to follow up on discussions related to their previous request for a prescription of Klonopin . The patient would like to speak with a member of clinical staff when available to discuss their current medication and it's effectiveness.

## 2024-03-01 ENCOUNTER — Other Ambulatory Visit: Payer: Self-pay | Admitting: Family Medicine

## 2024-03-02 ENCOUNTER — Ambulatory Visit: Admitting: Family Medicine

## 2024-03-02 ENCOUNTER — Encounter: Payer: Self-pay | Admitting: Family Medicine

## 2024-03-02 VITALS — BP 137/81 | HR 110 | Temp 98.2°F | Ht 60.0 in | Wt 105.0 lb

## 2024-03-02 DIAGNOSIS — F419 Anxiety disorder, unspecified: Secondary | ICD-10-CM

## 2024-03-02 MED ORDER — MIRTAZAPINE 7.5 MG PO TABS: 7.5000 mg | ORAL_TABLET | Freq: Every day | ORAL | 1 refills | Status: DC

## 2024-03-02 NOTE — Assessment & Plan Note (Signed)
 No additional benzodiazepines.  We discussed this today.  Starting on mirtazapine.  Hopefully this will help anxiety/panic as well as appetite and sleep.

## 2024-03-02 NOTE — Progress Notes (Signed)
 Subjective:  Patient ID: Roberta Baker, female    DOB: 04/03/1986  Age: 38 y.o. MRN: [id number]  CC:   Chief Complaint  Patient presents with   Follow-up    1 week f/u anxiety  Would like Klonopin  prescribed     HPI:  38 year old female presents for follow-up regarding anxiety.  Continues to have severe anxiety.  Hydroxyzine  not seeming to help.  GAD-7 score of 21 today.  Patient states that she has taken clonazepam  in the past, many years ago.  She states that this medication worked well for her.  I have advised her that this is not a good option as there have been recent issues regarding alprazolam .  She is currently on Cymbalta  60 mg daily.  She has had intolerance when going up and dose.  Patient states that she also feels like this significantly helps her depression and therefore she does not want to try something in its place.  Has tried SSRIs in the past as well as BuSpar .  Has an intolerance to BuSpar .  Additionally, patient states that she is having nausea, decreased appetite, and difficulty sleeping.  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    Social Hx   Social History   Socioeconomic History   Marital status: Single    Spouse name: Not on file   Number of children: 1   Years of education: Not on file   Highest education level: Some college, no degree  Occupational History   Not on file  Tobacco Use   Smoking status: Every Day    Current packs/day: 0.75    Average packs/day: 0.8 packs/day for 0.5 years (0.4 ttl pk-yrs)    Types: Cigarettes   Smokeless tobacco: Never  Vaping Use   Vaping status: Never Used  Substance and Sexual Activity   Alcohol use: No   Drug use: Yes    Types: Marijuana    Comment:  occasional   Sexual activity: Not Currently    Birth control/protection: Surgical    Comment: tubal  Other Topics Concern   Not on file  Social History Narrative   Not on file   Social Drivers of Health   Financial Resource Strain: Low Risk  (02/08/2023)   Overall Financial Resource Strain (CARDIA)    Difficulty of Paying Living Expenses: Not hard at all  Food Insecurity: No Food Insecurity (02/08/2023)   Hunger Vital Sign    Worried About Running Out of Food in the Last Year: Never true    Ran Out of Food in the Last Year: Never true  Transportation Needs: No Transportation Needs (02/08/2023)   PRAPARE - Administrator, Civil Service (Medical): No    Lack of Transportation (Non-Medical): No  Physical Activity: Insufficiently Active (02/08/2023)   Exercise Vital Sign    Days of Exercise per Week: 4 days    Minutes of Exercise per Session: 30 min  Stress: Stress Concern Present (02/08/2023)   [employer] of Occupational Health - Occupational Stress Questionnaire    Feeling of Stress : Rather much  Social Connections: Moderately Isolated (06/02/2021)   Social Connection and Isolation Panel    Frequency of Communication with Friends and Family: More than three times a week  Frequency of Social Gatherings with Friends and Family: More than three times a week    Attends Religious Services: Never    Database administrator or Organizations: No    Attends Engineer, structural: Never    Marital Status: Living with partner    Review of Systems Per HPI  Objective:  BP 137/81   Pulse (!) 110   Temp 98.2 F (36.8 C)   Ht 5' (1.524 m)   Wt 105 lb (47.6 kg)   LMP 01/06/2024 (Within Days) Comment: Tubal Ligation  SpO2 97%   BMI 20.51 kg/m      03/02/2024    9:15 AM 02/24/2024    9:54 AM 02/18/2024    9:03 AM  BP/Weight  Systolic BP 137 108 118  Diastolic BP 81 62 74  Wt. (Lbs) 105 105 106.6  BMI 20.51 kg/m2 20.51 kg/m2 20.82 kg/m2    Physical  Exam Vitals and nursing note reviewed.  Constitutional:      General: She is not in acute distress.    Appearance: Normal appearance.  HENT:     Head: Normocephalic and atraumatic.   Eyes:     General:        Right eye: No discharge.        Left eye: No discharge.     Conjunctiva/sclera: Conjunctivae normal.   Pulmonary:     Effort: Pulmonary effort is normal.   Neurological:     Mental Status: She is alert.   Psychiatric:        Mood and Affect: Mood normal.        Behavior: Behavior normal.     Lab Results  Component Value Date   WBC 7.7 01/12/2022   HGB 11.4 (L) 01/12/2022   HCT 35.0 (L) 01/12/2022   PLT 404 (H) 01/12/2022   GLUCOSE 97 04/22/2023   CHOL 214 (H) 11/08/2021   TRIG 138 11/08/2021   HDL 36 (L) 11/08/2021   LDLCALC 153 (H) 11/08/2021   ALT 11 04/22/2023   AST 16 04/22/2023   NA 140 04/22/2023   K 3.9 04/22/2023   CL 104 04/22/2023   CREATININE 0.76 04/22/2023   BUN 6 04/22/2023   CO2 24 04/22/2023   TSH 0.771 11/08/2021   INR 0.98 10/25/2018     Assessment & Plan:  Severe anxiety Assessment & Plan: No additional benzodiazepines.  We discussed this today.  Starting on mirtazapine.  Hopefully this will help anxiety/panic as well as appetite and sleep.   Other orders -     Mirtazapine; Take 1 tablet (7.5 mg total) by mouth at bedtime.  Dispense: 90 tablet; Refill: 1    Follow-up:  Return in about 6 weeks (around 04/13/2024).  Jacqulyn Ahle DO [facility]

## 2024-03-02 NOTE — Patient Instructions (Signed)
 Try the Mirtazapine.  Hydroxyzine  as needed.  Follow up in 6 weeks.

## 2024-03-10 ENCOUNTER — Ambulatory Visit (INDEPENDENT_AMBULATORY_CARE_PROVIDER_SITE_OTHER): Admitting: Psychiatry

## 2024-03-10 ENCOUNTER — Encounter (HOSPITAL_COMMUNITY): Payer: Self-pay

## 2024-03-10 DIAGNOSIS — F331 Major depressive disorder, recurrent, moderate: Secondary | ICD-10-CM

## 2024-03-10 DIAGNOSIS — F411 Generalized anxiety disorder: Secondary | ICD-10-CM | POA: Diagnosis not present

## 2024-03-10 NOTE — Progress Notes (Signed)
 IN-PERSON  THERAPIST PROGRESS NOTE  Session Time: Tuesday 03/10/2024 8:10 AM - 8:55 AM   Participation Level: Active  Behavioral Response: CasualAlertAnxious/fidgety/tense/talks excessively  Type of Therapy: Individual Therapy  Treatment Goals addressed: learn and implement relaxation techniques  ProgressTowards Goals: Formal treatment plan will be developed next session  Interventions: CBT and Supportive  Summary: Roberta Baker is a 38 y.o. female who referred for services by PCP Dr. Bluford.  She denies any psychiatric hospitalizations.  She has participated in counseling through various churches. Patient statesMy anxiety and depression are to the point I can't handle, I started needed something for anxiety when I was on adderall, had anxiety attack while on adderall, was in college at that time, I started needed something for depression after the birth of my second child 2 years ago.  Reports having difficulty focusing on 1 thing and being still.  Per her report she cannot have a conversation without her mind wandering.  Other symptoms include fluctuations change in energy/activity level, fatigue, hopelessness, irritability, sleep difficulty, muscle tension, worrying, and panic attacks.  Patient last was seen about 2 weeks ago.  She reports continued stress and anxiety since last session.  Per patient's report, she saw PCP last week who prescribed mirtazapine  and continue to recommend patient take Vistaril .  Patient expresses frustration as she reports little to no benefit from the medication.  She stated wanting to try Klonopin  as it has worked in the past.  Her doctor would not prescribe this as this is a benzodiazepine per patient's report.  She reports continuing to take 1-2 hits a day of a THC pen to try to increase her appetite.  She reports calling [facility] but did not follow through with an appointment as she did not experience the withdrawal symptoms her PCP told her she  may experience since abruptly discontinuing Xanax  per patient's report.  She reports continued sleep difficulty, poor concentration, nervousness, and excessive worry.  Patient reports continued heavy caffeine use but denies this has anything to do with her anxiety.  She reports constant fear that something will happen to her children.  Per patient's report, she continues to work at [employer].  Suicidal/Homicidal: Nowithout intent/plan  Therapist Response: Reviewed symptoms, discussed stressors, facilitated expression of thoughts and feelings validated feelings.  Develop treatment plan, patient signature page and treatment plan via MyChart, continue to provide psychoeducation on anxiety, discussed rationale for and developed plan with patient to practice progressive muscle relaxation, checked out and reactive activity to patient and provided with Access code to assist patient in her efforts. Plan: Return again in 2 weeks.  Diagnosis: GAD (generalized anxiety disorder)  Major depressive disorder, recurrent episode, moderate (HCC)  Collaboration of Care: Primary Care Provider AEB patient sees PCP Dr. Bluford for medication management.  Patient/Guardian was advised Release of Information must be obtained prior to any record release in order to collaborate their care with an outside provider. Patient/Guardian was advised if they have not already done so to contact the registration department to sign all necessary forms in order for us  to release information regarding their care.   Consent: Patient/Guardian gives verbal consent for treatment and assignment of benefits for services provided during this visit. Patient/Guardian expressed understanding and agreed to proceed.   Winton FORBES Rubinstein, LCSW 03/10/2024

## 2024-03-15 ENCOUNTER — Ambulatory Visit
Admission: EM | Admit: 2024-03-15 | Discharge: 2024-03-15 | Disposition: A | Discharge status: Home / Self Care | Attending: Family Medicine | Admitting: Family Medicine

## 2024-03-15 DIAGNOSIS — R61 Generalized hyperhidrosis: Secondary | ICD-10-CM | POA: Diagnosis not present

## 2024-03-15 DIAGNOSIS — R112 Nausea with vomiting, unspecified: Secondary | ICD-10-CM | POA: Diagnosis not present

## 2024-03-15 LAB — POCT URINE PREGNANCY: Preg Test, Ur: NEGATIVE

## 2024-03-15 MED ORDER — ONDANSETRON 4 MG PO TBDP: 4.0000 mg | ORAL_TABLET | Freq: Three times a day (TID) | ORAL | 0 refills | Status: DC | PRN

## 2024-03-15 MED ORDER — ONDANSETRON 4 MG PO TBDP
4.0000 mg | ORAL_TABLET | Freq: Once | ORAL | Status: AC
  Administered 2024-03-15: 4 mg via ORAL

## 2024-03-15 NOTE — ED Provider Notes (Addendum)
 [facility]    CSN: [id number] Arrival date & time: 03/15/24  0813      History   Chief Complaint No chief complaint on file.   HPI Roberta Baker is a 38 y.o. female.   Patient presenting today with 5-day history of nausea, vomiting, fatigue, chills, sweats.  Denies abdominal pain, diarrhea, constipation, urinary symptoms, upper respiratory symptoms, hematemesis, new foods or medication changes recently.  So far trying to stay hydrated and able to tolerate crackers and chicken broth.  Concern for pregnancy as she thinks she missed her menstrual cycle last month.  She is status post tubal ligation.    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    Past Surgical History:  Procedure Laterality Date   BIOPSY  05/30/2023   Procedure: BIOPSY;  Surgeon: Cindie Carlin POUR, DO;  Location: [facility];  Service: Endoscopy;;   BREAST ENHANCEMENT SURGERY     BREAST SURGERY     ESOPHAGOGASTRODUODENOSCOPY (EGD) WITH PROPOFOL  N/A 05/30/2023   Procedure: ESOPHAGOGASTRODUODENOSCOPY (EGD) WITH PROPOFOL ;  Surgeon: Cindie Carlin POUR, DO;  Location: [facility];  Service: Endoscopy;  Laterality: N/A;  12:00pm, asa 2   LAPAROSCOPIC BILATERAL SALPINGECTOMY Bilateral 01/16/2022   Procedure: LAPAROSCOPIC BILATERAL SALPINGECTOMY;  Surgeon: Ozan, Jennifer, DO;  Location: [facility];  Service: Gynecology;  Laterality: Bilateral;   LEEP N/A  01/16/2022   Procedure: LOOP ELECTROSURGICAL EXCISION PROCEDURE (LEEP);  Surgeon: Ozan, Jennifer, DO;  Location: [facility];  Service: Gynecology;  Laterality: N/A;   TUBAL LIGATION      OB History     Gravida  2   Para  2   Term  2   Preterm      AB  0   Living  2      SAB      IAB      Ectopic      Multiple  0   Live Births  2            Home Medications    Prior to Admission medications   Medication Sig Start Date End Date Taking? Authorizing Provider  ondansetron  (ZOFRAN -ODT) 4 MG disintegrating tablet Take 1 tablet (4 mg total) by mouth every 8 (eight) hours as needed for nausea or vomiting. 03/15/24  Yes Stuart Vernell Norris, PA-C  DULoxetine  (CYMBALTA ) 60 MG capsule Take 1 capsule (60 mg total) by mouth daily. 11/07/23   Cook, Jayce G, DO  hydrOXYzine  (VISTARIL ) 25 MG capsule Take 1 capsule (25 mg total) by mouth 3 (three) times daily as needed for anxiety. 02/24/24   Cook, Jayce G, DO  lamoTRIgine  (LAMICTAL ) 100 MG tablet Take 1/2 pill po x 6 days, then take 1/2 pill twice a day x 6 days, then take 1/2 pill qAM and 1 pill qPM x 6 days then take 1  po bid 02/13/24   Sater, Charlie LABOR, MD  levETIRAcetam  (KEPPRA ) 500 MG tablet Take 1 tablet (500 mg total) by mouth 2 (two) times daily. 02/24/24   Sater, Charlie LABOR, MD  mirtazapine  (REMERON ) 7.5 MG tablet Take 1 tablet (7.5 mg total) by mouth at bedtime. 03/02/24   Cook, Jayce G, DO  pantoprazole  (PROTONIX ) 40 MG tablet Take 1 tablet (40 mg total) by mouth daily. 05/30/23 05/29/24  Cindie Carlin POUR, DO  promethazine  (PHENERGAN ) 25 MG tablet Take 1 tablet (25 mg total) by mouth every 6 (six) hours as needed for nausea or vomiting. 02/04/24   Kennedy Charmaine CROME, NP    Family History Family History  Problem Relation Age of Onset   Bipolar disorder Mother    Mental illness Mother    Seizures Mother    Cancer Maternal Uncle    Heart attack Maternal Grandfather    Cancer Maternal Grandmother        breast cancer    Social  History Social History   Tobacco Use   Smoking status: Every Day    Current packs/day: 0.75    Average packs/day: 0.8 packs/day for 0.5 years (0.4 ttl pk-yrs)    Types: Cigarettes   Smokeless tobacco: Never  Vaping Use   Vaping status: Never Used  Substance Use Topics   Alcohol use: No   Drug use: Yes    Types: Marijuana    Comment: occasional     Allergies   Buspar  [buspirone ] and Sulfa antibiotics   Review of Systems Review of Systems Per HPI  Physical Exam Triage Vital Signs ED Triage Vitals  Encounter Vitals Group     BP 03/15/24 0910 102/70     Girls Systolic BP Percentile --      Girls Diastolic BP Percentile --      Boys Systolic BP Percentile --      Boys Diastolic BP Percentile --      Pulse Rate 03/15/24 0910 (!) 112     Resp 03/15/24 0910 16     Temp 03/15/24 0910 98.6 F (37 C)     Temp Source 03/15/24 0910 Oral     SpO2 03/15/24 0910 98 %     Weight --      Height --      Head Circumference --      Peak Flow --      Pain Score 03/15/24 0914 5     Pain Loc --      Pain Education --      Exclude from Growth Chart --    No data found.  Updated Vital Signs BP 102/70 (BP Location: Right Arm)   Pulse (!) 112   Temp 98.6 F (37 C) (Oral)   Resp 16   LMP 01/25/2024 (Exact Date)   SpO2 98%   Visual Acuity Right Eye Distance:   Left Eye Distance:   Bilateral Distance:    Right Eye Near:   Left Eye Near:    Bilateral Near:     Physical Exam Vitals and nursing note reviewed.  Constitutional:      Appearance: Normal appearance. She is not ill-appearing.  HENT:     Head: Atraumatic.     Mouth/Throat:     Mouth: Mucous membranes are moist.  Eyes:     Extraocular Movements: Extraocular movements intact.     Conjunctiva/sclera: Conjunctivae normal.  Cardiovascular:     Rate and Rhythm: Regular rhythm.     Heart sounds: Normal heart  sounds.  Pulmonary:     Effort: Pulmonary effort is normal.     Breath sounds: Normal breath sounds.   Abdominal:     General: Bowel sounds are normal. There is no distension.     Palpations: Abdomen is soft.     Tenderness: There is no abdominal tenderness. There is no right CVA tenderness, left CVA tenderness or guarding.  Musculoskeletal:        General: Normal range of motion.     Cervical back: Normal range of motion and neck supple.  Skin:    General: Skin is warm and dry.  Neurological:     Mental Status: She is alert and oriented to person, place, and time.  Psychiatric:        Mood and Affect: Mood normal.        Thought Content: Thought content normal.        Judgment: Judgment normal.      UC Treatments / Results  Labs (all labs ordered are listed, but only abnormal results are displayed) Labs Reviewed  POCT URINE PREGNANCY    EKG   Radiology No results found.  Procedures Procedures (including critical care time)  Medications Ordered in UC Medications  ondansetron  (ZOFRAN -ODT) disintegrating tablet 4 mg (has no administration in time range)    Initial Impression / Assessment and Plan / UC Course  I have reviewed the triage vital signs and the nursing notes.  Pertinent labs & imaging results that were available during my care of the patient were reviewed by me and considered in my medical decision making (see chart for details).     Overall vitals and exam very reassuring today, she is well-appearing and in no acute distress with no red flag findings on exam.  Urine pregnancy negative.  Will treat with Zofran , brat diet, fluids, rest, possibly viral GI illness.  ED precautions given for worsening symptoms, follow-up with PCP if not resolving. Final Clinical Impressions(s) / UC Diagnoses   Final diagnoses:  Nausea and vomiting, unspecified vomiting type  Diaphoresis   Discharge Instructions   None    ED Prescriptions     Medication Sig Dispense Auth. Provider   ondansetron  (ZOFRAN -ODT) 4 MG disintegrating tablet Take 1 tablet (4 mg total) by  mouth every 8 (eight) hours as needed for nausea or vomiting. 20 tablet Stuart Vernell Norris, [place]      PDMP not reviewed this encounter.   Stuart Vernell Norris, PA-C 03/15/24 1000    Stuart Vernell Norris, [place] 03/15/24 1001

## 2024-03-15 NOTE — ED Triage Notes (Signed)
 Pt reports possible pregnancy. Does not remember having cycle in June states she has had her tubes tied. Has had nausea and vomiting since Wednesday.

## 2024-03-18 ENCOUNTER — Encounter: Payer: Self-pay | Admitting: Internal Medicine

## 2024-03-19 ENCOUNTER — Other Ambulatory Visit: Payer: Self-pay | Admitting: Gastroenterology

## 2024-03-19 DIAGNOSIS — R112 Nausea with vomiting, unspecified: Secondary | ICD-10-CM

## 2024-03-19 MED ORDER — PANTOPRAZOLE SODIUM 40 MG PO TBEC: 40.0000 mg | DELAYED_RELEASE_TABLET | Freq: Every day | ORAL | 11 refills | Status: AC

## 2024-03-19 MED ORDER — PROMETHAZINE HCL 25 MG PO TABS: 25.0000 mg | ORAL_TABLET | Freq: Four times a day (QID) | ORAL | 1 refills | Status: DC | PRN

## 2024-03-24 ENCOUNTER — Ambulatory Visit (INDEPENDENT_AMBULATORY_CARE_PROVIDER_SITE_OTHER): Admitting: Psychiatry

## 2024-03-24 DIAGNOSIS — F411 Generalized anxiety disorder: Secondary | ICD-10-CM | POA: Diagnosis not present

## 2024-03-24 NOTE — Progress Notes (Signed)
 IN-PERSON  THERAPIST PROGRESS NOTE  Session Time: Tuesday 03/24/2024 8:12 AM - 9:03 AM   Participation Level: Active  Behavioral Response: CasualAlertAnxious/fidgety/tense/talks excessively  Type of Therapy: Individual Therapy  Treatment Goals addressed:Jenya will score less than 5 on the Generalized Anxiety Disorder 7 Scale (GAD-7)   Learn and  implement relaxation techniques, practice a relaxation technique daily    ProgressTowards Goals:  Progressing  Interventions: CBT and Supportive   Summary: Roberta Baker is a 38 y.o. female who referred for services by PCP Dr. Bluford.  She denies any psychiatric hospitalizations.  She has participated in counseling through various churches. Patient statesMy anxiety and depression are to the point I can't handle, I started needed something for anxiety when I was on adderall, had anxiety attack while on adderall, was in college at that time, I started needed something for depression after the birth of my second child 2 years ago.  Reports having difficulty focusing on 1 thing and being still.  Per her report she cannot have a conversation without her mind wandering.  Other symptoms include fluctuations change in energy/activity level, fatigue, hopelessness, irritability, sleep difficulty, muscle tension, worrying, and panic attacks.  Patient last was seen about 2 weeks ago.  She reports continued symptoms of severe anxiety since last session as reflected in the GAD-7.  She reports she did not practice the progressive muscle relaxation exercise as she did not understand the instructions.  She reports using deep breathing as an intervention but reports difficulty still relaxing.  She also has not been practicing deep breathing on a regular basis.  She continues to experience significant sleep difficulty.  She continues heavy caffeine use but reports she  tried to replace 2-3 sodas a day with noncaffeinated drinks.  However, she reports having  difficulty with this and resuming her usual consumption of caffeine.  Suicidal/Homicidal: Nowithout intent/plan  Therapist Response: Reviewed symptoms, praised and reinforced patient's attempts to reduce caffeine use, assisted patient identify ways to reduce caffeine use more gradually but consistently by trying to reduce by half a cup per day initially for several days and then gradually reducing by a cup per day, provided psychoeducation on anxiety, reviewed rationale for and assisted patient practice progressive muscle relaxation, developed plan with patient to practice at bedtime nightly, checked out interactive audio activity to patient and provided access code to assist patient in her efforts  Plan: Return again in 2 weeks.  Diagnosis: No diagnosis found.  Collaboration of Care: Primary Care Provider AEB patient sees PCP Dr. Bluford for medication management.  Patient/Guardian was advised Release of Information must be obtained prior to any record release in order to collaborate their care with an outside provider. Patient/Guardian was advised if they have not already done so to contact the registration department to sign all necessary forms in order for us  to release information regarding their care.   Consent: Patient/Guardian gives verbal consent for treatment and assignment of benefits for services provided during this visit. Patient/Guardian expressed understanding and agreed to proceed.   Winton FORBES Rubinstein, LCSW 03/24/2024

## 2024-04-07 ENCOUNTER — Ambulatory Visit (INDEPENDENT_AMBULATORY_CARE_PROVIDER_SITE_OTHER): Admitting: Psychiatry

## 2024-04-07 DIAGNOSIS — F331 Major depressive disorder, recurrent, moderate: Secondary | ICD-10-CM | POA: Diagnosis not present

## 2024-04-07 DIAGNOSIS — F411 Generalized anxiety disorder: Secondary | ICD-10-CM

## 2024-04-07 NOTE — Progress Notes (Signed)
 IN-PERSON  THERAPIST PROGRESS NOTE  Session Time: Tuesday 04/07/2024 8:11 AM -  8:58 PM   Participation Level: Active  Behavioral Response: CasualAlertAnxious/fidgety/tense/talks excessively  Type of Therapy: Individual Therapy  Treatment Goals addressed:[place] will score less than 5 on the Generalized Anxiety Disorder 7 Scale (GAD-7)   Learn and  implement relaxation techniques, practice a relaxation technique daily    ProgressTowards Goals:  Progressing  Interventions: CBT and Supportive   Summary: Roberta Baker is a 38 y.o. female who referred for services by PCP Dr. Bluford.  She denies any psychiatric hospitalizations.  She has participated in counseling through various churches. Patient statesMy anxiety and depression are to the point I can't handle, I started needed something for anxiety when I was on adderall, had anxiety attack while on adderall, was in college at that time, I started needed something for depression after the birth of my second child 2 years ago.  Reports having difficulty focusing on 1 thing and being still.  Per her report she cannot have a conversation without her mind wandering.  Other symptoms include fluctuations change in energy/activity level, fatigue, hopelessness, irritability, sleep difficulty, muscle tension, worrying, and panic attacks.  Patient last was seen about 2 weeks ago.  She reports continued symptoms of severe anxiety since last session as reflected in the GAD-7.  She states anxiety has felt more intense. She reports having an emotional outburst at work . She reports increased worry about finances as her check was reduced due to missing a day from work. She also is worried about having enough funds to celebrate her son's birthday. She continues to have constant thoughts about trying to be a good mother. She also has constant worry about the safety of her children. Pt reports she has not been using progressive muscle relaxation as it is not  helpful. She states it is too slow. She reports responding to activities that are more physical like dancing. She reports trying to incorporate this into her life daily. She also reports using humor seems to help. She continues to express frustration she has no medication to address anxiety but also expresses fear of considering different medication due to possible side effects. She reports reduced efforts regarding decreasing caffeine use.   Suicidal/Homicidal: Nowithout intent/plan  Therapist Response: Reviewed symptoms, administered GAD-7, discussed results, discussed stressors, facilitated expression of thoughts and feelings, validated feelings, began to assist patient examine connection between thoughts/mood/behavior, began to assist patient identify her thought patterns and effects on her current functioning, began to assist patient identify more realistic expectations of self, began to explore patient's thought patterns about medication, gathered more information from patient regarding her symptoms particularly ruminating thoughts versus racing thoughts, discussed assessing more for bipolar disorder next session due to patient's symptoms and genetic history, assisted patient identify physical activities to continue to pursue to help manage anxiety, also assisted patient identify activities involving humor and laughter, encouraged patient to try to practice relaxation technique daily   Plan: Return again in 2 weeks.  Diagnosis: GAD (generalized anxiety disorder)  Major depressive disorder, recurrent episode, moderate (HCC)  Collaboration of Care: Primary Care Provider AEB patient sees PCP Dr. Bluford for medication management.  Patient/Guardian was advised Release of Information must be obtained prior to any record release in order to collaborate their care with an outside provider. Patient/Guardian was advised if they have not already done so to contact the registration department to sign all  necessary forms in order for us  to release information regarding  their care.   Consent: Patient/Guardian gives verbal consent for treatment and assignment of benefits for services provided during this visit. Patient/Guardian expressed understanding and agreed to proceed.   Winton FORBES Rubinstein, LCSW 04/07/2024

## 2024-04-13 ENCOUNTER — Ambulatory Visit: Admitting: Family Medicine

## 2024-04-13 ENCOUNTER — Encounter: Payer: Self-pay | Admitting: Family Medicine

## 2024-04-13 VITALS — BP 110/76 | HR 79 | Temp 98.1°F | Ht 60.0 in | Wt 103.0 lb

## 2024-04-13 DIAGNOSIS — F419 Anxiety disorder, unspecified: Secondary | ICD-10-CM

## 2024-04-13 MED ORDER — MIRTAZAPINE 15 MG PO TABS: 15.0000 mg | ORAL_TABLET | Freq: Every day | ORAL | 1 refills | Status: DC

## 2024-04-13 NOTE — Assessment & Plan Note (Signed)
 Doing better currently. Seeing therapist regularly. Increasing Mirtazapine  to see if this can aid in sleep as well as anxiety and appetite.

## 2024-04-13 NOTE — Progress Notes (Signed)
 Subjective:  Patient ID: Roberta Baker, female    DOB: 22-May-1986  Age: 38 y.o. MRN: [id number]  CC:   Chief Complaint  Patient presents with   Anxiety    HPI:  38 year old female presents for follow-up regarding anxiety.  Seeing a therapist on a regular basis.  States that anxiety is stable.  No better or worse.  She states that she feels like she has not had additional improvement due to underlying circumstances.  She is having issues with sleep.  Has trouble falling asleep.  GI issues are stable.  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    Social Hx   Social History   Socioeconomic History   Marital status: Single    Spouse name: Not on file   Number of children: 1   Years of education: Not on file   Highest education level: Some college, no degree  Occupational History   Not on file  Tobacco Use   Smoking status: Every Day    Current packs/day: 0.75    Average packs/day: 0.8 packs/day for 0.5 years (0.4 ttl pk-yrs)    Types: Cigarettes   Smokeless tobacco: Never  Vaping Use   Vaping status: Never Used  Substance and Sexual Activity   Alcohol use: No   Drug use: Yes    Types: Marijuana    Comment: occasional   Sexual activity: Not Currently    Birth control/protection: Surgical    Comment: tubal  Other Topics Concern   Not on file  Social History Narrative   Not on file   Social Drivers of Health   Financial Resource Strain: Low Risk  (02/08/2023)   Overall Financial Resource Strain (CARDIA)    Difficulty of Paying Living Expenses: Not hard at all  Food Insecurity: No Food Insecurity (02/08/2023)   Hunger Vital Sign    Worried About Running Out of Food in the Last Year: Never true    Ran Out of Food  in the Last Year: Never true  Transportation Needs: No Transportation Needs (02/08/2023)   PRAPARE - Administrator, Civil Service (Medical): No    Lack of Transportation (Non-Medical): No  Physical Activity: Insufficiently Active (02/08/2023)   Exercise Vital Sign    Days of Exercise per Week: 4 days    Minutes of Exercise per Session: 30 min  Stress: Stress Concern Present (02/08/2023)   [employer] of Occupational Health - Occupational Stress Questionnaire    Feeling of Stress : Rather much  Social Connections: Moderately Isolated (06/02/2021)   Social Connection and Isolation Panel    Frequency of Communication with Friends and Family: More than three times a week    Frequency of Social Gatherings with Friends and Family: More than three times a week    Attends Religious Services: Never    Database administrator or Organizations: No    Attends Banker Meetings: Never    Marital Status: Living with partner    Review of Systems Per HPI  Objective:  BP 110/76   Pulse 79   Temp 98.1 F (36.7 C)   Ht 5' (1.524 m)   Wt 103 lb (46.7 kg)   LMP 01/25/2024 (Exact Date)   SpO2 99%  BMI 20.12 kg/m      04/13/2024    9:17 AM 03/15/2024    9:10 AM 03/02/2024    9:15 AM  BP/Weight  Systolic BP 110 102 137  Diastolic BP 76 70 81  Wt. (Lbs) 103  105  BMI 20.12 kg/m2  20.51 kg/m2    Physical Exam Vitals and nursing note reviewed.  Constitutional:      General: She is not in acute distress. HENT:     Head: Normocephalic and atraumatic.  Cardiovascular:     Rate and Rhythm: Normal rate and regular rhythm.  Pulmonary:     Effort: Pulmonary effort is normal.     Breath sounds: Normal breath sounds.  Neurological:     Mental Status: She is alert.  Psychiatric:     Comments: Psychomotor agitation noted.     Lab Results  Component Value Date   WBC 7.7 01/12/2022   HGB 11.4 (L) 01/12/2022   HCT 35.0 (L) 01/12/2022   PLT 404 (H) 01/12/2022    GLUCOSE 97 04/22/2023   CHOL 214 (H) 11/08/2021   TRIG 138 11/08/2021   HDL 36 (L) 11/08/2021   LDLCALC 153 (H) 11/08/2021   ALT 11 04/22/2023   AST 16 04/22/2023   NA 140 04/22/2023   K 3.9 04/22/2023   CL 104 04/22/2023   CREATININE 0.76 04/22/2023   BUN 6 04/22/2023   CO2 24 04/22/2023   TSH 0.771 11/08/2021   INR 0.98 10/25/2018     Assessment & Plan:  Severe anxiety Assessment & Plan: Doing better currently. Seeing therapist regularly. Increasing Mirtazapine  to see if this can aid in sleep as well as anxiety and appetite.    Other orders -     Mirtazapine ; Take 1 tablet (15 mg total) by mouth at bedtime.  Dispense: 90 tablet; Refill: 1    Follow-up:  Has follow up in December.  Jacqulyn Ahle DO [facility]

## 2024-04-13 NOTE — Patient Instructions (Signed)
 Medication sent in.  Follow up in 6 months.  Message with concerns.

## 2024-04-27 ENCOUNTER — Ambulatory Visit: Attending: Internal Medicine | Admitting: Internal Medicine

## 2024-04-27 NOTE — Progress Notes (Signed)
 Erroneous encounter - please disregard.

## 2024-05-01 ENCOUNTER — Other Ambulatory Visit: Payer: Self-pay | Admitting: Gastroenterology

## 2024-05-01 DIAGNOSIS — R112 Nausea with vomiting, unspecified: Secondary | ICD-10-CM

## 2024-05-01 MED ORDER — PROMETHAZINE HCL 25 MG PO TABS: 25.0000 mg | ORAL_TABLET | Freq: Four times a day (QID) | ORAL | 1 refills | Status: DC | PRN

## 2024-05-05 ENCOUNTER — Ambulatory Visit (HOSPITAL_COMMUNITY): Admitting: Psychiatry

## 2024-05-05 DIAGNOSIS — F331 Major depressive disorder, recurrent, moderate: Secondary | ICD-10-CM | POA: Diagnosis not present

## 2024-05-05 DIAGNOSIS — F411 Generalized anxiety disorder: Secondary | ICD-10-CM | POA: Diagnosis not present

## 2024-05-05 NOTE — Progress Notes (Signed)
 IN-PERSON  THERAPIST PROGRESS NOTE  Session Time: Tuesday 05/05/2024 8:10 AM -  9:00 AM   Participation Level: Active  Behavioral Response: CasualAlertAnxious/fidgety/tense/talks excessively  Type of Therapy: Individual Therapy  Treatment Goals addressed:Tram will score less than 5 on the Generalized Anxiety Disorder 7 Scale (GAD-7)   Learn and  implement relaxation techniques, practice a relaxation technique daily    ProgressTowards Goals:  Progressing  Interventions: CBT and Supportive   Summary: Roberta Baker is a 38 y.o. female who referred for services by PCP Dr. Bluford.  She denies any psychiatric hospitalizations.  She has participated in counseling through various churches. Patient statesMy anxiety and depression are to the point I can't handle, I started needed something for anxiety when I was on adderall, had anxiety attack while on adderall, was in college at that time, I started needed something for depression after the birth of my second child 2 years ago.  Reports having difficulty focusing on 1 thing and being still.  Per her report she cannot have a conversation without her mind wandering.  Other symptoms include fluctuations change in energy/activity level, fatigue, hopelessness, irritability, sleep difficulty, muscle tension, worrying, and panic attacks.  Patient last was seen about 4 weeks ago.  She reports continued symptoms of severe anxiety since last session as reflected in the GAD-7.  She reports anxiety now feels more like anger.  She reports becoming angry very easily and having to refrain self from engaging in aggressive behavior.  Patient reports she has been keeping a journal to try to find triggers of anxiety.  Triggers include if she is at work particularly around people making assumptions about her and her behavior.  Another trigger is interaction with her 79-1/2-year-old daughter who has become more aggressive in her behavior.  Patient reports feeling  as though she is not a good parent to this particular child but still feels positive about her parenting responsibilities with her 72-year-old child.  Another trigger is self-imposed timelines.  Patient reports putting pressure on self to get things done by a certain time.  Patient remains very fidgety and restless in today's session.  She reports continued sleep difficulty.  She saw PCP recently who increased patient's Remeron .    Suicidal/Homicidal: Nowithout intent/plan  Therapist Response: Reviewed symptoms, administered GAD-7, discussed results, discussed stressors, facilitated expression of thoughts and feelings, validated feelings, praised and reinforced patient's efforts to journal, discussed effects, discussed triggers of anxiety, also did screenings for possible diagnoses of bipolar disorder, began to discuss the results of screening, discussed the need for medication evaluation, assisted patient identify/challenge/and replace negative thoughts about scheduling,agreed to schedule appointment with NP Shuvon Rankin for medication evaluation, reviewed relaxation techniques, and develop plan with patient to continue practicing relaxation techniques consistently, also developed plan with patient to continue to journal began to assist patient examine connection between thoughts/mood/behavior, began to assist patient identify her thought patterns and effects on her current functioning,   Plan: Return again in 2 weeks.  Diagnosis: GAD (generalized anxiety disorder)  Major depressive disorder, recurrent episode, moderate (HCC) R/O Bipolar Disorder Collaboration of Care: Primary Care Provider AEB patient sees PCP Dr. Bluford for medication management.  Patient/Guardian was advised Release of Information must be obtained prior to any record release in order to collaborate their care with an outside provider. Patient/Guardian was advised if they have not already done so to contact the registration department to  sign all necessary forms in order for us  to release information regarding their care.  Consent: Patient/Guardian gives verbal consent for treatment and assignment of benefits for services provided during this visit. Patient/Guardian expressed understanding and agreed to proceed.   Winton FORBES Rubinstein, LCSW 05/05/2024

## 2024-05-07 ENCOUNTER — Ambulatory Visit: Attending: Internal Medicine | Admitting: Internal Medicine

## 2024-05-07 ENCOUNTER — Encounter: Payer: Self-pay | Admitting: Internal Medicine

## 2024-05-07 VITALS — BP 98/64 | HR 85 | Ht 60.0 in | Wt 107.4 lb

## 2024-05-07 DIAGNOSIS — R55 Syncope and collapse: Secondary | ICD-10-CM | POA: Diagnosis not present

## 2024-05-07 NOTE — Patient Instructions (Addendum)

## 2024-05-07 NOTE — Progress Notes (Signed)
 Cardiology Office Note  Date: 05/07/2024   ID: [name], Marrin Aug 12, 1986, [name]  PCP:  Cook, Jayce G, DO  Cardiologist:  Allice Garro P Jarmaine Ehrler, MD Electrophysiologist:  None   History of Present Illness: Roberta Baker is a 38 y.o. female  Referred to cardiology clinic for evaluation of syncope.  Patient had 1 episode of seizure attack in May 2025.  Around 1 week later, she had witnessed syncope with no prodromal signs or symptoms.  No seizure-like activity at that time.  She stopped Xanax  a week prior to this as she felt this was not working.  She not have any recurrence of syncope since then.  She is extremely physically active at baseline.  She works out every day.  She works out for couple of hours almost daily.  Did not have any symptoms of chest pain, DOE dizziness, syncope during the workout sessions.  She thinks her syncopal event was from severe anxiety.  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    Past Surgical History:  Procedure Laterality Date   BIOPSY  05/30/2023   Procedure: BIOPSY;  Surgeon: Cindie Carlin POUR, DO;  Location: [facility];  Service: Endoscopy;;   BREAST ENHANCEMENT SURGERY     BREAST SURGERY     ESOPHAGOGASTRODUODENOSCOPY (EGD) WITH PROPOFOL  N/A 05/30/2023   Procedure: ESOPHAGOGASTRODUODENOSCOPY (EGD) WITH PROPOFOL ;  Surgeon: Cindie Carlin POUR, DO;  Location: [facility];  Service: Endoscopy;  Laterality: N/A;  12:00pm, asa 2   LAPAROSCOPIC BILATERAL SALPINGECTOMY Bilateral 01/16/2022   Procedure: LAPAROSCOPIC BILATERAL SALPINGECTOMY;  Surgeon: Ozan, Jennifer, DO;  Location: [facility];  Service: Gynecology;  Laterality: Bilateral;   LEEP N/A 01/16/2022   Procedure:  LOOP ELECTROSURGICAL EXCISION PROCEDURE (LEEP);  Surgeon: Ozan, Jennifer, DO;  Location: [facility];  Service: Gynecology;  Laterality: N/A;   TUBAL LIGATION      Current Outpatient Medications  Medication Sig Dispense Refill   DULoxetine  (CYMBALTA ) 60 MG capsule Take 1 capsule (60 mg total) by mouth daily. 90 capsule 3   hydrOXYzine  (VISTARIL ) 25 MG capsule Take 1 capsule (25 mg total) by mouth 3 (three) times daily as needed for anxiety. 90 capsule 1   levETIRAcetam  (KEPPRA ) 500 MG tablet Take 1 tablet (500 mg total) by mouth 2 (two) times daily. 60 tablet 11   mirtazapine  (REMERON ) 15 MG tablet Take 1 tablet (15 mg total) by mouth at bedtime. 90 tablet 1   promethazine  (PHENERGAN ) 25 MG tablet Take 1 tablet (25 mg total) by mouth every 6 (six) hours as needed for nausea or vomiting. 130 tablet 1   pantoprazole  (PROTONIX ) 40 MG tablet Take 1 tablet (40 mg total) by mouth daily. (Patient not taking: Reported on 05/07/2024) 30 tablet 11   No current facility-administered medications for this visit.   Allergies:  Buspar  [buspirone ] and Sulfa antibiotics   Social History: The patient  reports that she has been smoking cigarettes. She has a 0.4 pack-year smoking history. She has never used smokeless tobacco. She reports current drug use. Drug: Marijuana. She reports that she does not drink alcohol.   Family History: The patient's family history  includes Bipolar disorder in her mother; Cancer in her maternal grandmother and maternal uncle; Heart attack in her maternal grandfather; Mental illness in her mother; Seizures in her mother.   ROS:  Please see the history of present illness. Otherwise, complete review of systems is positive for none  All other systems are reviewed and negative.   Physical Exam: VS:  BP 98/64   Pulse 85   Ht 5' (1.524 m)   Wt 107 lb 6.4 oz (48.7 kg)   BMI 20.98 kg/m , BMI Body mass index is 20.98 kg/m.  Wt Readings from Last 3 Encounters:  05/07/24 107 lb 6.4 oz (48.7  kg)  04/13/24 103 lb (46.7 kg)  03/02/24 105 lb (47.6 kg)    General: Patient appears comfortable at rest. HEENT: Conjunctiva and lids normal, oropharynx clear with moist mucosa. Neck: Supple, no elevated JVP or carotid bruits, no thyromegaly. Lungs: Clear to auscultation, nonlabored breathing at rest. Cardiac: Regular rate and rhythm, no S3 or significant systolic murmur, no pericardial rub. Abdomen: Soft, nontender, no hepatomegaly, bowel sounds present, no guarding or rebound. Extremities: No pitting edema, distal pulses 2+. Skin: Warm and dry. Musculoskeletal: No kyphosis. Neuropsychiatric: Alert and oriented x3, affect grossly appropriate.  Recent Labwork: No results found for requested labs within last 365 days.     Component Value Date/Time   CHOL 214 (H) 11/08/2021 1519   TRIG 138 11/08/2021 1519   HDL 36 (L) 11/08/2021 1519   CHOLHDL 5.9 (H) 11/08/2021 1519   LDLCALC 153 (H) 11/08/2021 1519    Other Studies Reviewed Today:   Assessment and Plan:  Syncope - Patient had 1 episode of witnessed syncope in May 2025 almost a week after she had seizure-like activity.  She was started on Keppra  by her neurologist after which did not have any more syncopal events.  Patient thinks syncope was related to severe anxiety. - No recurrences since May 2025. - METs more than 10.  Works out for couple of hours daily with no symptoms of chest pain, DOE, dizziness, palpitations or syncope. - EKG today showed NSR, normal QTc interval and no evidence of preexcitation. - Patient had 1 episode of syncope in May 2025 with no recurrences.  Likely related to anxiety, seizure etc.  Due to absence of recurrences, will defer any cardiac workup at this time.  But if she has any recurrent syncope, we will start with echocardiogram to rule out structural heart disease, ETT to rule out any exertional arrhythmias and a 2-week event monitor.  30 minutes spent in reviewing the prior records, imaging, more  than 3 labs, discussing the above problems with the patient and documentation.     Medication Adjustments/Labs and Tests Ordered: Current medicines are reviewed at length with the patient today.  Concerns regarding medicines are outlined above.    Disposition:  Follow up prn  Signed Josel Keo Arleta Maywood, MD, 05/07/2024 2:14 PM    [facility] at [name] [address] [place], [place], [place] [address]

## 2024-05-26 ENCOUNTER — Ambulatory Visit (HOSPITAL_COMMUNITY): Admitting: Psychiatry

## 2024-06-08 ENCOUNTER — Telehealth (HOSPITAL_COMMUNITY): Payer: Self-pay | Admitting: Registered Nurse

## 2024-06-09 ENCOUNTER — Ambulatory Visit (INDEPENDENT_AMBULATORY_CARE_PROVIDER_SITE_OTHER): Payer: Self-pay | Admitting: Psychiatry

## 2024-06-09 DIAGNOSIS — F411 Generalized anxiety disorder: Secondary | ICD-10-CM

## 2024-06-09 DIAGNOSIS — F331 Major depressive disorder, recurrent, moderate: Secondary | ICD-10-CM | POA: Diagnosis not present

## 2024-06-09 NOTE — Progress Notes (Signed)
 IN-PERSON  THERAPIST PROGRESS NOTE  Session Time: Tuesday 10/72025 8:12 AM -  9:00 AM   Participation Level: Active  Behavioral Response: CasualAlertAnxious/fidgety/tense/talks excessively  Type of Therapy: Individual Therapy  Treatment Goals addressed:Coutney will score less than 5 on the Generalized Anxiety Disorder 7 Scale (GAD-7)   Learn and  implement relaxation techniques, practice a relaxation technique daily    ProgressTowards Goals: Not progressing   Interventions: CBT and Supportive   Summary: Roberta Baker is a 38 y.o. female who referred for services by PCP Dr. Bluford.  She denies any psychiatric hospitalizations.  She has participated in counseling through various churches. Patient statesMy anxiety and depression are to the point I can't handle, I started needed something for anxiety when I was on adderall, had anxiety attack while on adderall, was in college at that time, I started needed something for depression after the birth of my second child 2 years ago.  Reports having difficulty focusing on 1 thing and being still.  Per her report she cannot have a conversation without her mind wandering.  Other symptoms include fluctuations change in energy/activity level, fatigue, hopelessness, irritability, sleep difficulty, muscle tension, worrying, and panic attacks.  Patient last was seen about 4 weeks ago.  She reports continued symptoms of severe anxiety since last session.  She reports daily crying spells, worrying, muscle tension, loss of appetite, nervousness, and irritability.  She also reports significant sleep difficulty and states sleeping only 4 hours on a good day.  She also is experiencing increased anger and frustration.  She states easily becoming upset at work but reports strong support from her supervisor as well as coworkers.  She still is having to refrain self from becoming aggressive per her report.  She reports financial stress and recently having to  borrow money to fill one of her prescriptions.  She also learned yesterday she lost her Medicaid insurance.  She also is experiencing anger as well as frustration.  She reports taking increased dosage of Remeron  as prescribed by PCP but says it is not working.  She is scheduled to see NP Shuvon Rankin for medication evaluation on 06/29/2024.  Patient reports continued heavy caffeine use as well as cigarette use.  She reports she has not been practicing relaxation techniques in the past couple of weeks.  She states sometimes she has some relief from anxiety and worrying is when she has a few minutes to herself after work but reports this is not always predictable.  Suicidal/Homicidal: Nowithout intent/plan  Therapist Response: Reviewed symptoms, discussed stressors, facilitated expression of thoughts and feelings, validated feelings, encouraged patient to follow through with medication evaluation with NP Shuvon Rankin, assisted patient ways to increase predictable time for self to practice self-care and relaxation, assisted patient identify and address thoughts and processes that may inhibit implementation of plan, developed plan with patient to implement strategies discussed in session including participating in physical activity she likes such as dancing, also discussed listening to music and using relaxation apps on her phone, assisted patient identify reasons for implementing plans discussed in session, assisted patient identify coping statements    Plan: Return again in 2 weeks.  Diagnosis: GAD (generalized anxiety disorder)  Major depressive disorder, recurrent episode, moderate (HCC) R/O Bipolar Disorder Collaboration of Care: Primary Care Provider AEB patient sees PCP Dr. Bluford for medication management.  Patient/Guardian was advised Release of Information must be obtained prior to any record release in order to collaborate their care with an outside provider. Patient/Guardian was  advised if they  have not already done so to contact the registration department to sign all necessary forms in order for us  to release information regarding their care.   Consent: Patient/Guardian gives verbal consent for treatment and assignment of benefits for services provided during this visit. Patient/Guardian expressed understanding and agreed to proceed.   Winton FORBES Rubinstein, LCSW 06/09/2024

## 2024-06-23 ENCOUNTER — Ambulatory Visit (INDEPENDENT_AMBULATORY_CARE_PROVIDER_SITE_OTHER): Payer: Self-pay | Admitting: Psychiatry

## 2024-06-23 DIAGNOSIS — F411 Generalized anxiety disorder: Secondary | ICD-10-CM | POA: Diagnosis not present

## 2024-06-23 DIAGNOSIS — F331 Major depressive disorder, recurrent, moderate: Secondary | ICD-10-CM | POA: Diagnosis not present

## 2024-06-23 NOTE — Progress Notes (Signed)
 IN-PERSON  THERAPIST PROGRESS NOTE  Session Time: Tuesday 06/23/2024 8:12 AM - 9:00 AM   Participation Level: Active  Behavioral Response: CasualAlertAnxious/fidgety/tense/talks excessively  Type of Therapy: Individual Therapy  Treatment Goals addressed:[name] will score less than 5 on the Generalized Anxiety Disorder 7 Scale (GAD-7)   Learn and  implement relaxation techniques, practice a relaxation technique daily    ProgressTowards Goals: Not progressing   Interventions: CBT and Supportive   Summary: Roberta Baker is a 38 y.o. female who referred for services by PCP Dr. Bluford.  She denies any psychiatric hospitalizations.  She has participated in counseling through various churches. Patient statesMy anxiety and depression are to the point I can't handle, I started needed something for anxiety when I was on adderall, had anxiety attack while on adderall, was in college at that time, I started needed something for depression after the birth of my second child 2 years ago.  Reports having difficulty focusing on 1 thing and being still.  Per her report she cannot have a conversation without her mind wandering.  Other symptoms include fluctuations change in energy/activity level, fatigue, hopelessness, irritability, sleep difficulty, muscle tension, worrying, and panic attacks.  Patient last was seen about 2 weeks ago.  She reports continued symptoms of severe anxiety since last session.  She continues to report worrying, memory difficulty, muscle tension, loss of appetite, restlessness,nervousness, and irritability.  She reports continued significant sleep difficulty and averages sleeping only 4 hours per night. She has experienced sleeping about 8 hours only once since last session and attributes increased sleep to working a double shift at work. Pt maintains heavy caffeine consumption and nicotine use.  She continues to experience frequent what if thoughts anticipating the worst.  She now is experiencing loss issues as her boss was fired a few weeks ago. Pt maintains contact with boss outside of work but misses her boss' support at work. Pt reports  implementing plan of trying to schedule predictable time for self-care/relaxation and used monitors to address her fear of being away from children when she is outside. This helped to some degree. She reports using journaling and states this has reduced some anxiety about forgetting things.  This also has helped patient identify her triggers of anxiety and has helped her find more helpful ways to manage.  She is becoming more aware of her thoughts and is beginning to evaluate her thoughts.  She reports relief regarding Medicaid as it has been reinstated. Patient is scheduled to see NP Roberta Baker for medication evaluation next week.  She remains anxious about upcoming appointment.    Suicidal/Homicidal: Nowithout intent/plan  Therapist Response: Reviewed symptoms, praised and reinforced patient's efforts to schedule predictable time for self-care, discussed effects, praised and reinforced patient's use of journaling, assisted patient identify benefits of journaling, praised and reinforced patient's increased awareness of triggers and efforts to manage, assisted patient identify connection between thoughts/mood/behavior, discussed role of flexibility in the thought process, assisted patient identify and reframe anxiety provoking thoughts about medication evaluation appointment, developed plan with patient to use replacement statements, developed plan with patient to continue journaling,  discussed stressors, facilitated expression of thoughts and feelings, validated feelings, encouraged patient to follow through with medication evaluation with NP Roberta Rankin,   Plan: Return again in 2 weeks.  Diagnosis: Generalized anxiety disorder, major depressive disorder, recurrent, moderate R/O Bipolar Disorder  Collaboration of Care: Primary Care  Provider AEB patient sees PCP Dr. Bluford for medication management.  Patient/Guardian was advised Release  of Information must be obtained prior to any record release in order to collaborate their care with an outside provider. Patient/Guardian was advised if they have not already done so to contact the registration department to sign all necessary forms in order for us  to release information regarding their care.   Consent: Patient/Guardian gives verbal consent for treatment and assignment of benefits for services provided during this visit. Patient/Guardian expressed understanding and agreed to proceed.   Roberta FORBES Rubinstein, LCSW 06/23/2024

## 2024-06-29 ENCOUNTER — Encounter (HOSPITAL_COMMUNITY): Payer: Self-pay | Admitting: Registered Nurse

## 2024-06-29 ENCOUNTER — Ambulatory Visit (HOSPITAL_COMMUNITY): Payer: Self-pay | Admitting: Registered Nurse

## 2024-06-29 VITALS — BP 105/71 | HR 118 | Ht 60.0 in | Wt 106.0 lb

## 2024-06-29 DIAGNOSIS — G4700 Insomnia, unspecified: Secondary | ICD-10-CM

## 2024-06-29 DIAGNOSIS — F411 Generalized anxiety disorder: Secondary | ICD-10-CM

## 2024-06-29 DIAGNOSIS — F332 Major depressive disorder, recurrent severe without psychotic features: Secondary | ICD-10-CM | POA: Diagnosis not present

## 2024-06-29 DIAGNOSIS — Z79899 Other long term (current) drug therapy: Secondary | ICD-10-CM | POA: Diagnosis not present

## 2024-06-29 MED ORDER — QUETIAPINE FUMARATE 50 MG PO TABS
50.0000 mg | ORAL_TABLET | Freq: Every day | ORAL | 1 refills | Status: AC
Start: 2024-06-29 — End: ?

## 2024-06-29 MED ORDER — DULOXETINE HCL 30 MG PO CPEP: 60.0000 mg | ORAL_CAPSULE | Freq: Two times a day (BID) | ORAL | 1 refills | Status: AC

## 2024-06-29 MED ORDER — MIRTAZAPINE 15 MG PO TABS: 15.0000 mg | ORAL_TABLET | Freq: Every day | ORAL | 1 refills | Status: AC

## 2024-06-29 MED ORDER — PROPRANOLOL HCL 10 MG PO TABS: 10.0000 mg | ORAL_TABLET | Freq: Three times a day (TID) | ORAL | 1 refills | Status: AC | PRN

## 2024-06-29 MED ORDER — PROPRANOLOL HCL 10 MG PO TABS
10.0000 mg | ORAL_TABLET | Freq: Three times a day (TID) | ORAL | 1 refills | Status: DC
Start: 2024-06-29 — End: 2024-06-29

## 2024-06-29 NOTE — Patient Instructions (Signed)
 Routine Lab Work: Labs are ordered since it's been a while since your last tests. Labs should be checked at least yearly, or more frequently depending on prescribed medications. Routine blood work helps assess overall health and rule out non-psychiatric conditions. Important tests include: Complete Blood Count (CBC) with Differential/Platelet Comprehensive Metabolic Panel Hemoglobin A1c (diabetes screening) Magnesium Ethanol Urinalysis (Routine with reflex microscopic, Clean Catch) Pregnancy test (urine, for females of childbearing age) POCT Urine Drug Screen (if prescribed any controlled substance) EKG Recommendations: An EKG is recommended before starting psychotropic medications and periodically thereafter to monitor for prolonged QTc, which can indicate cardiac risk factors. Some psychotropic medications can increase the risk of prolonged QTc. Have your primary care provider perform an EKG at your next visit and send the results if not available on Epic. Please ensure you have your labs drawn before your next scheduled visit.    Call 911, [phone], mobile crisis, or present to the nearest emergency room should you experience any suicidal/homicidal ideation, auditory/visual/hallucinations, or detrimental worsening of your mental health.  Mobile Crisis Response Teams Listed by counties in vicinity of [facility] providers [facility] [phone] [facility] [phone] [facility] [phone] [facility] [phone] [place]                * [facility] [phone]                * [facility] [phone]  [facility] [phone] [facility] [employer]  [phone] * [facility] [phone]

## 2024-06-29 NOTE — Progress Notes (Signed)
 [facility] MD/PA/NP OP Progress Note  06/29/2024 8:37 AM Roberta Baker  MRN:  [id number]  Chief Complaint:  Chief Complaint  Patient presents with   Follow-up    Medication management   HPI: Roberta Baker 38 y.o. female presents today for medication management follow up.  She was seen face-to-face by this provide and chart reviewed on 06/29/24  Her psychiatric history is significant for major depression, general anxiety, and insomnia.  His/Her mental health is currently managed with Remeron  15 mg daily at bedtime, Cymbalta  30 mg twice daily, and Vistaril  25 mg 3 times daily as needed.  She reports current medications are not managing her mental health effectively.  She reports Remeron  is not helping her sleep, Vistaril  does not control her anxiety, and unable to take more than 60 mg of Cymbalta  without it causing her to have an upset stomach.  Reports the only medication that has helped her with her anxiety has been Xanax  but the last time it was prescribed by Dr. Bluford (PCP) in June 2024 she had flushed down the toilet and he refused to give her another prescription.  She was informed today that this provider did not do long-term benzodiazepine to treat anxiety.  She then states that I do not have to have a benzodiazepine just anything that would help my anxiety.  Medications discussed and adjustments may.  During initial visit she had informed that Dr. Bluford would be managing her medication but states she was referred back related to medication is not working. Today she denies suicidal/self-harm/homicidal ideation, psychosis, paranoia, and abnormal movement.  Screenings completed during today's visit PHQ-9, C-SSRS, GAD-7, AIMS, AUDIT, Nutrition, and Pain, see scores below.    Recommendations: Continue Cymbalta  30 mg twice daily, Remeron  15 mg daily at bedtime, started propranolol 10 mg 3 times daily as needed, Seroquel 50 mg daily at bedtime, and discontinued Vistaril  25 mg 3 times daily as  needed since ineffective. She was educated on the side effect and efficacy profile of propranolol and Seroquel and educational material was added to AVS. Informed that therapeutic effects may take several weeks to become noticeable.  She voiced understanding and agreement with today's plan and recommendations. Visit Diagnosis:    ICD-10-CM   1. Major depressive disorder, recurrent severe without psychotic features (HCC)  F33.2     2. GAD (generalized anxiety disorder)  F41.1     3. 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    Past Surgical History:  Procedure Laterality Date   BIOPSY  05/30/2023   Procedure: BIOPSY;  Surgeon: Cindie Carlin POUR, DO;  [facility];  Service: Endoscopy;;   BREAST ENHANCEMENT SURGERY     BREAST SURGERY     ESOPHAGOGASTRODUODENOSCOPY (EGD) WITH PROPOFOL  N/A 05/30/2023   Procedure: ESOPHAGOGASTRODUODENOSCOPY (EGD) WITH PROPOFOL ;  Surgeon: Cindie Carlin POUR, DO;  Location: [facility];  Service: Endoscopy;  Laterality: N/A;  12:00pm, asa 2   LAPAROSCOPIC BILATERAL SALPINGECTOMY Bilateral 01/16/2022  Procedure: LAPAROSCOPIC BILATERAL SALPINGECTOMY;  Surgeon: Ozan, Jennifer, DO;  Location: [facility];  Service: Gynecology;  Laterality: Bilateral;   LEEP N/A 01/16/2022   Procedure: LOOP ELECTROSURGICAL EXCISION PROCEDURE (LEEP);  Surgeon: Ozan, Jennifer, DO;  Location: [facility];  Service: Gynecology;  Laterality: N/A;   TUBAL LIGATION      Family Psychiatric History: See below and family history  Family History:  Family History   Problem Relation Age of Onset   Bipolar disorder Mother    Mental illness Mother    Seizures Mother    Cancer Maternal Uncle    Heart attack Maternal Grandfather    Cancer Maternal Grandmother        breast cancer    Social History:  Social History   Socioeconomic History   Marital status: Single    Spouse name: Not on file   Number of children: 1   Years of education: Not on file   Highest education level: Some college, no degree  Occupational History   Not on file  Tobacco Use   Smoking status: Every Day    Current packs/day: 0.75    Average packs/day: 0.8 packs/day for 0.5 years (0.4 ttl pk-yrs)    Types: Cigarettes   Smokeless tobacco: Never  Vaping Use   Vaping status: Never Used  Substance and Sexual Activity   Alcohol use: No   Drug use: Yes    Types: Marijuana    Comment: occasional   Sexual activity: Not Currently    Birth control/protection: Surgical    Comment: tubal  Other Topics Concern   Not on file  Social History Narrative   Not on file   Social Drivers of Health   Financial Resource Strain: Low Risk  (02/08/2023)   Overall Financial Resource Strain (CARDIA)    Difficulty of Paying Living Expenses: Not hard at all  Food Insecurity: No Food Insecurity (02/08/2023)   Hunger Vital Sign    Worried About Running Out of Food in the Last Year: Never true    Ran Out of Food in the Last Year: Never true  Transportation Needs: No Transportation Needs (02/08/2023)   PRAPARE - Administrator, Civil Service (Medical): No    Lack of Transportation (Non-Medical): No  Physical Activity: Insufficiently Active (02/08/2023)   Exercise Vital Sign    Days of Exercise per Week: 4 days    Minutes of Exercise per Session: 30 min  Stress: Stress Concern Present (02/08/2023)   [employer] of Occupational Health - Occupational Stress Questionnaire    Feeling of Stress : Rather much  Social Connections: Moderately Isolated (06/02/2021)   Social Connection  and Isolation Panel    Frequency of Communication with Friends and Family: More than three times a week    Frequency of Social Gatherings with Friends and Family: More than three times a week    Attends Religious Services: Never    Database Administrator or Organizations: No    Attends Banker Meetings: Never    Marital Status: Living with partner    Allergies:  Allergies  Allergen Reactions   Buspar  [Buspirone ] Other (See Comments)    Caused nightmares/hallucinations   Sulfa Antibiotics Nausea And Vomiting    Metabolic Disorder Labs: No results found for: HGBA1C, MPG No results found for: PROLACTIN Lab Results  Component Value Date   CHOL 214 (H) 11/08/2021   TRIG 138 11/08/2021   HDL 36 (L) 11/08/2021   CHOLHDL 5.9 (H) 11/08/2021  LDLCALC 153 (H) 11/08/2021   Lab Results  Component Value Date   TSH 0.771 11/08/2021    Current Medications: Current Outpatient Medications  Medication Sig Dispense Refill   DULoxetine  (CYMBALTA ) 60 MG capsule Take 1 capsule (60 mg total) by mouth daily. 90 capsule 3   levETIRAcetam  (KEPPRA ) 500 MG tablet Take 1 tablet (500 mg total) by mouth 2 (two) times daily. 60 tablet 11   mirtazapine  (REMERON ) 15 MG tablet Take 1 tablet (15 mg total) by mouth at bedtime. 90 tablet 1   promethazine  (PHENERGAN ) 25 MG tablet Take 1 tablet (25 mg total) by mouth every 6 (six) hours as needed for nausea or vomiting. 130 tablet 1   pantoprazole  (PROTONIX ) 40 MG tablet Take 1 tablet (40 mg total) by mouth daily. (Patient not taking: Reported on 06/29/2024) 30 tablet 11   No current facility-administered medications for this visit.     Musculoskeletal: Strength & Muscle Tone: within normal limits Gait & Station: normal Patient leans: N/A  Psychiatric Specialty Exam: Review of Systems  Constitutional:        No other complaints voiced  Psychiatric/Behavioral:  Positive for agitation, dysphoric mood and sleep disturbance. Negative  for hallucinations, self-injury and suicidal ideas. The patient is nervous/anxious.   All other systems reviewed and are negative.   Blood pressure 105/71, pulse (!) 118, height 5' (1.524 m), weight 106 lb (48.1 kg), last menstrual period 06/03/2024, SpO2 100%.Body mass index is 20.7 kg/m.  General Appearance: Casual  Eye Contact:  Good  Speech:  Clear and Coherent and Normal Rate  Volume:  Normal  Mood:  Anxious and Dysphoric  Affect:  Congruent  Thought Process:  Coherent, Goal Directed, and Descriptions of Associations: Intact  Orientation:  Full (Time, Place, and Person)  Thought Content: Logical   Suicidal Thoughts:  No  Homicidal Thoughts:  No  Memory:  Immediate;   Good Recent;   Good Remote;   Good  Judgement:  Good  Insight:  Present  Psychomotor Activity:  Normal  Concentration:  Concentration: Good and Attention Span: Good  Recall:  Good  Fund of Knowledge: Good  Language: Good  Akathisia:  No  Handed:  Right  AIMS (if indicated): done  Assets:  Communication Skills Desire for Improvement Financial Resources/Insurance Housing Leisure Time Physical Health Resilience Social Support Transportation  ADL's:  Intact  Cognition: WNL  Sleep:  Fair   Screenings: Geneticist, Molecular Office Visit from 06/29/2024 in [facility] [place] Office Visit from 10/21/2023 in [facility] [place]  AIMS Total Score 0 0   GAD-7    Flowsheet Row Office Visit from 06/29/2024 in [facility] [place] Counselor from 05/05/2024 in [facility] [place] Office Visit from 04/13/2024 in [facility] [place] [facility] Counselor from 04/07/2024 in [facility] [place] Office Visit from 03/02/2024 in [facility] [place] [facility]  Total GAD-7 Score 21 21 21 21 21    PHQ2-9    Flowsheet Row Office Visit from  06/29/2024 in [facility] [place] Office Visit from 04/13/2024 in [facility] [place] [facility] Office Visit from 03/02/2024 in [facility] [place] [facility] Office Visit from 02/24/2024 in [name] [place] [facility] Office Visit from 02/18/2024 in [facility] [place] [facility]  PHQ-2 Total Score 2 0 0 0 1  PHQ-9 Total Score 15 8 5 6  8  Flowsheet Row Office Visit from 06/29/2024 in [facility] [place] [facility] from 03/15/2024 in [name] from 12/23/2023 in [facility] [place]  C-SSRS RISK CATEGORY No Risk No Risk No Risk     Assessment and Plan:  Assessment: Summary of today's assessment: Rhyse M Reif reports current medications are not effectively managing her mental health.  Reports she was referred back to psychiatry by Dr. Bluford (PCP).  Reporting symptoms of depression, anxiety, and insomnia that have not improved since her last initial visit with this provider.  She denies suicidal/self-harm/homicidal ideation, psychosis, paranoia, and abnormal movements. During visit she was dressed appropriate for age and weather.  She was seated comfortably in chair with no noted distress.  She was alert/oriented x 4, calm/cooperative and mood congruent with affect.  She spoke in a clear tone at moderate volume, and normal pace, with good eye contact.  Her thought process was coherent, relevant, and there was no indication that she was responding to internal/external stimuli or experiencing delusional thought content.  1. Major depressive disorder, recurrent severe without psychotic features (HCC) (Primary) - QUEtiapine (SEROQUEL) 50 MG tablet; Take 1 tablet (50 mg total) by mouth at bedtime.  Dispense: 30 tablet; Refill: 1 - DULoxetine  (CYMBALTA ) 30 MG capsule; Take 2 capsules (60 mg total) by mouth 2 (two) times daily.  Dispense:  60 capsule; Refill: 1  2. GAD (generalized anxiety disorder) - QUEtiapine (SEROQUEL) 50 MG tablet; Take 1 tablet (50 mg total) by mouth at bedtime.  Dispense: 30 tablet; Refill: 1 - DULoxetine  (CYMBALTA ) 30 MG capsule; Take 2 capsules (60 mg total) by mouth 2 (two) times daily.  Dispense: 60 capsule; Refill: 1 - propranolol (INDERAL) 10 MG tablet; Take 1 tablet (10 mg total) by mouth 3 (three) times daily as needed.  Dispense: 90 tablet; Refill: 1  3. Insomnia, unspecified type - QUEtiapine (SEROQUEL) 50 MG tablet; Take 1 tablet (50 mg total) by mouth at bedtime.  Dispense: 30 tablet; Refill: 1 - mirtazapine  (REMERON ) 15 MG tablet; Take 1 tablet (15 mg total) by mouth at bedtime.  Dispense: 30 tablet; Refill: 1  4. On psychotropic medication - TSH - CBC with Differential - Comprehensive metabolic panel with GFR - Prolactin - hCG, serum, qualitative - Lipid panel - Magnesium  - HgB A1c - Drugs of abuse screen w/o alc (for BH OP)       Plan: Medication management: Meds ordered this encounter  Medications   QUEtiapine (SEROQUEL) 50 MG tablet    Sig: Take 1 tablet (50 mg total) by mouth at bedtime.    Dispense:  30 tablet    Refill:  1    Supervising Provider:   CURRY, SYED T [[id number]]   DULoxetine  (CYMBALTA ) 30 MG capsule    Sig: Take 2 capsules (60 mg total) by mouth 2 (two) times daily.    Dispense:  60 capsule    Refill:  1    Supervising Provider:   ARFEEN, SYED T [[id number]]   mirtazapine  (REMERON ) 15 MG tablet    Sig: Take 1 tablet (15 mg total) by mouth at bedtime.    Dispense:  30 tablet    Refill:  1    Supervising Provider:   ARFEEN, SYED T [[id number]]   DISCONTD: propranolol (INDERAL) 10 MG tablet    Sig: Take 1 tablet (10 mg total) by mouth 3 (three) times daily.    Dispense:  90 tablet  Refill:  1    Supervising Provider:   CURRY PATERSON T [[id number]]   propranolol (INDERAL) 10 MG tablet    Sig: Take 1 tablet (10 mg total) by mouth 3 (three) times daily as needed.     Dispense:  90 tablet    Refill:  1    Supervising Provider:   ARFEEN, SYED T [[id number]]   Medications Discontinued During This Encounter  Medication Reason   hydrOXYzine  (VISTARIL ) 25 MG capsule Ineffective   DULoxetine  (CYMBALTA ) 60 MG capsule    mirtazapine  (REMERON ) 15 MG tablet Reorder   propranolol (INDERAL) 10 MG tablet     Lab Orders         TSH         CBC with Differential         Comprehensive metabolic panel with GFR         Prolactin         hCG, serum, qualitative         Lipid panel         Magnesium          HgB A1c         Drugs of abuse screen w/o alc (for BH OP)      Other:  Counseling/Therapy: Continue services with Winton Rubinstein, LCSW Jeidi Gilles Boal was instructed to call 911, [phone], mobile crisis, or present to the nearest emergency room should she experiences any suicidal/homicidal ideation, auditory/visual/hallucinations, or detrimental worsening of her mental health condition.   Referred to PCP for sleep study related to insomnia and reporting never had a sleep study done. Michalene M Shatzer participated in the development of this treatment plan and verbalized her understanding/agreement with plan as listed.   Follow Up: Return in 1 month for medication management Call in the interim for any side-effects, decompensation, questions, or problems  Collaboration of Care: Collaboration of Care: Medication Management AEB medication assessment, adjustment, refills, started propranolol and Seroquel, Primary Care Provider AEB referral to PCP for sleep study, and Other labs ordered  Patient/Guardian was advised Release of Information must be obtained prior to any record release in order to collaborate their care with an outside provider. Patient/Guardian was advised if they have not already done so to contact the registration department to sign all necessary forms in order for us  to release information regarding their care.   Consent: Patient/Guardian gives verbal  consent for treatment and assignment of benefits for services provided during this visit. Patient/Guardian expressed understanding and agreed to proceed.    Kastin Cerda, NP 06/29/2024, 8:37 AM

## 2024-07-07 ENCOUNTER — Other Ambulatory Visit: Payer: Self-pay | Admitting: Gastroenterology

## 2024-07-07 DIAGNOSIS — R112 Nausea with vomiting, unspecified: Secondary | ICD-10-CM

## 2024-07-07 MED ORDER — PROMETHAZINE HCL 25 MG PO TABS: 25.0000 mg | ORAL_TABLET | Freq: Four times a day (QID) | ORAL | 2 refills | Status: DC | PRN

## 2024-07-08 ENCOUNTER — Ambulatory Visit: Admitting: Internal Medicine

## 2024-07-08 ENCOUNTER — Ambulatory Visit (INDEPENDENT_AMBULATORY_CARE_PROVIDER_SITE_OTHER): Admitting: Psychiatry

## 2024-07-08 DIAGNOSIS — F331 Major depressive disorder, recurrent, moderate: Secondary | ICD-10-CM

## 2024-07-08 DIAGNOSIS — F411 Generalized anxiety disorder: Secondary | ICD-10-CM | POA: Diagnosis not present

## 2024-07-08 DIAGNOSIS — F332 Major depressive disorder, recurrent severe without psychotic features: Secondary | ICD-10-CM

## 2024-07-08 NOTE — Addendum Note (Signed)
 Addended by: Nonna Renninger B on: 07/08/2024 04:30 PM   Modules accepted: Level of Service

## 2024-07-08 NOTE — Progress Notes (Signed)
 IN-PERSON  THERAPIST PROGRESS NOTE  Session Time: Wednesday  07/08/2024 8:06 AM -  8:51 AM   Participation Level: Active  Behavioral Response: CasualAlertAnxious/fidgety/tense/talks excessively  Type of Therapy: Individual Therapy  Treatment Goals addressed:Niomi will score less than 5 on the Generalized Anxiety Disorder 7 Scale (GAD-7)   Learn and  implement relaxation techniques, practice a relaxation technique daily    ProgressTowards Goals: Not progressing   Interventions: CBT and Supportive   Summary: Roberta Baker is a 38 y.o. female who referred for services by PCP Dr. Bluford.  She denies any psychiatric hospitalizations.  She has participated in counseling through various churches. Patient statesMy anxiety and depression are to the point I can't handle, I started needed something for anxiety when I was on adderall, had anxiety attack while on adderall, was in college at that time, I started needed something for depression after the birth of my second child 2 years ago.  Reports having difficulty focusing on 1 thing and being still.  Per her report she cannot have a conversation without her mind wandering.  Other symptoms include fluctuations change in energy/activity level, fatigue, hopelessness, irritability, sleep difficulty, muscle tension, worrying, and panic attacks.  Patient last was seen about 2 weeks ago.  She reports continued symptoms of severe anxiety since last session.  She continues to report worrying, memory difficulty, muscle tension, loss of appetite, restlessness,nervousness, and irritability.  She reports seeing NP Shuvon Rankin for medication evaluation.  Per her report, she started taking Seroquel as prescribed but reports side effects of increased nervousness, agitation, and anger.  She also reports increased sleep difficulty along with racing thoughts.  Patient reports discontinuing the medication this past Sunday.  She reports increased frustration and  anxiety at work.  However, she reports being more mindful of her body's reactions.  She reports taking a break and going to the bathroom to avoid lashing out.  She reports trying to use other coping techniques like deep breathing and having a dance party with her children but not experiencing relief like she once did.  She expresses frustration and anger and anxiety still being uncontrolled.  I  Suicidal/Homicidal: Nowithout intent/plan  Suicidal/homicidal: No/without plan, without intent  Therapist Response: Reviewed symptoms, praised and reinforced patient attending medication evaluation appointment, facilitated patient expressing thoughts and feelings, validated feelings, developed plan with patient to inform CMA and NP Shuvan Rankin regarding her response to medication,   Plan: Return again in 2 weeks.  Diagnosis: Generalized anxiety disorder, major depressive disorder, recurrent, moderate R/O Bipolar Disorder  Collaboration of Care: Primary Care Provider AEB patient sees PCP Dr. Bluford for medication management.  Patient/Guardian was advised Release of Information must be obtained prior to any record release in order to collaborate their care with an outside provider. Patient/Guardian was advised if they have not already done so to contact the registration department to sign all necessary forms in order for us  to release information regarding their care.   Consent: Patient/Guardian gives verbal consent for treatment and assignment of benefits for services provided during this visit. Patient/Guardian expressed understanding and agreed to proceed.   Winton FORBES Rubinstein, LCSW 07/08/2024

## 2024-07-27 ENCOUNTER — Ambulatory Visit (HOSPITAL_COMMUNITY): Admitting: Registered Nurse

## 2024-08-06 ENCOUNTER — Ambulatory Visit (HOSPITAL_COMMUNITY): Admitting: Psychiatry

## 2024-08-06 DIAGNOSIS — F411 Generalized anxiety disorder: Secondary | ICD-10-CM

## 2024-08-06 DIAGNOSIS — F332 Major depressive disorder, recurrent severe without psychotic features: Secondary | ICD-10-CM

## 2024-08-06 NOTE — Progress Notes (Signed)
 IN-PERSON  THERAPIST PROGRESS NOTE  Session Time:  Thursday   08/06/2024 8:14 AM - 9:05 AM   Participation Level: Active  Behavioral Response: CasualAlertAnxious/fidgety/tense/talks excessively  Type of Therapy: Individual Therapy  Treatment Goals addressed:Leighton will score less than 5 on the Generalized Anxiety Disorder 7 Scale (GAD-7)   Learn and  implement relaxation techniques, practice a relaxation technique daily    ProgressTowards Goals: Not progressing   Interventions: CBT and Supportive   Summary: Roberta Baker is a 38 y.o. female who referred for services by PCP Dr. Bluford.  She denies any psychiatric hospitalizations.  She has participated in counseling through various churches. Patient statesMy anxiety and depression are to the point I can't handle, I started needed something for anxiety when I was on adderall, had anxiety attack while on adderall, was in college at that time, I started needed something for depression after the birth of my second child 2 years ago.  Reports having difficulty focusing on 1 thing and being still.  Per her report she cannot have a conversation without her mind wandering.  Other symptoms include fluctuations change in energy/activity level, fatigue, hopelessness, irritability, sleep difficulty, muscle tension, worrying, and panic attacks.  Patient last was seen about 4 weeks ago.  She reports continued symptoms of severe anxiety since last session.  She continues to report worrying, memory difficulty, muscle tension, loss of appetite, restlessness,nervousness, anger, and irritability.  She did not follow through on contacting NP Shuvon Rankin regarding medication. She is considering scheduling an appointment with a doctor recommended by a friend.  However, she expresses reluctance to seek any medical provider regarding medication as she states no one really listens and will give her what she needs.  She also says something always happens and  she runs into roadblocks when trying to get help.  Patient reports still practicing deep breathing to try to manage symptoms, She reports  continued heavy caffeine consumption.  She reports continued stress at home as her children's father does not help out with chores or the bills. She reports stress at work and still misses her former boss who would help calm her down per pt's report.   She reports additional stress 50-year-old son who is being bullied at school.  Patient has contacted school personnel.  suicidal/Homicidal: Nowithout intent/plan  Suicidal/homicidal: No/without plan, without intent  Therapist Response: Reviewed symptoms, discussed stressors, facilitated expression of thoughts and feelings, validated feelings, assisted patient identify realistic expectations versus demands regarding interaction with her children's father, assisted patient identify and examine her thought patterns regarding working with medical providers, praised and reinforced patient's efforts through deep breathing, explored ways to use physical activity/exercise to help manage stress,\/anxiety/anger developed plan with patient to participate in physical activity/exercise 3-4 times per week for 15 minutes  Plan: Return again in 2 weeks.  Diagnosis: Generalized anxiety disorder, major depressive disorder, recurrent, moderate R/O Bipolar Disorder   Collaboration of Care: Primary Care Provider AEB patient sees PCP Dr. Bluford for medication management.  Patient/Guardian was advised Release of Information must be obtained prior to any record release in order to collaborate their care with an outside provider. Patient/Guardian was advised if they have not already done so to contact the registration department to sign all necessary forms in order for us  to release information regarding their care.   Consent: Patient/Guardian gives verbal consent for treatment and assignment of benefits for services provided during this visit.  Patient/Guardian expressed understanding and agreed to proceed.   Roberto Romanoski E  Flavio Lindroth, LCSW 08/06/2024

## 2024-08-19 ENCOUNTER — Ambulatory Visit: Admitting: Family Medicine

## 2024-08-20 ENCOUNTER — Ambulatory Visit (HOSPITAL_COMMUNITY): Admitting: Psychiatry

## 2024-09-10 ENCOUNTER — Ambulatory Visit (INDEPENDENT_AMBULATORY_CARE_PROVIDER_SITE_OTHER): Payer: Self-pay | Admitting: Psychiatry

## 2024-09-10 DIAGNOSIS — F411 Generalized anxiety disorder: Secondary | ICD-10-CM

## 2024-09-10 DIAGNOSIS — F331 Major depressive disorder, recurrent, moderate: Secondary | ICD-10-CM

## 2024-09-10 DIAGNOSIS — F332 Major depressive disorder, recurrent severe without psychotic features: Secondary | ICD-10-CM
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
## 2024-09-30 ENCOUNTER — Ambulatory Visit (HOSPITAL_COMMUNITY): Payer: Self-pay | Admitting: Psychiatry

## 2024-10-02 ENCOUNTER — Other Ambulatory Visit: Payer: Self-pay | Admitting: Gastroenterology

## 2024-10-02 DIAGNOSIS — R112 Nausea with vomiting, unspecified: Secondary | ICD-10-CM

## 2024-10-02 MED ORDER — PROMETHAZINE HCL 25 MG PO TABS: 25.0000 mg | ORAL_TABLET | Freq: Four times a day (QID) | ORAL | 2 refills | Status: AC | PRN

## 2024-10-16 ENCOUNTER — Ambulatory Visit (HOSPITAL_COMMUNITY): Admitting: Psychiatry

## 2024-11-03 ENCOUNTER — Ambulatory Visit (HOSPITAL_COMMUNITY): Admitting: Psychiatry

## 2024-11-18 ENCOUNTER — Ambulatory Visit (HOSPITAL_COMMUNITY): Payer: Self-pay | Admitting: Psychiatry

## 2024-12-02 ENCOUNTER — Ambulatory Visit (HOSPITAL_COMMUNITY): Payer: Self-pay | Admitting: Psychiatry
